# Patient Record
Sex: Male | Born: 1955
Health system: Southern US, Community
[De-identification: ages and names within clinical notes are randomized; demographics above are authoritative.]

## PROBLEM LIST (undated history)

## (undated) DIAGNOSIS — N138 Other obstructive and reflux uropathy: Secondary | ICD-10-CM

## (undated) DIAGNOSIS — N529 Male erectile dysfunction, unspecified: Secondary | ICD-10-CM

## (undated) DIAGNOSIS — N401 Enlarged prostate with lower urinary tract symptoms: Secondary | ICD-10-CM

## (undated) DIAGNOSIS — E291 Testicular hypofunction: Secondary | ICD-10-CM

## (undated) DIAGNOSIS — I499 Cardiac arrhythmia, unspecified: Secondary | ICD-10-CM

## (undated) DIAGNOSIS — G4733 Obstructive sleep apnea (adult) (pediatric): Secondary | ICD-10-CM

## (undated) DIAGNOSIS — I4891 Unspecified atrial fibrillation: Secondary | ICD-10-CM

## (undated) DIAGNOSIS — B1910 Unspecified viral hepatitis B without hepatic coma: Secondary | ICD-10-CM

## (undated) DIAGNOSIS — J069 Acute upper respiratory infection, unspecified: Secondary | ICD-10-CM

## (undated) HISTORY — DX: Other obstructive and reflux uropathy: N13.8

## (undated) HISTORY — DX: Male erectile dysfunction, unspecified: N52.9

## (undated) HISTORY — DX: Testicular hypofunction: E29.1

## (undated) HISTORY — DX: Obstructive sleep apnea (adult) (pediatric): G47.33

## (undated) HISTORY — DX: Other obstructive and reflux uropathy: N40.1

## (undated) HISTORY — DX: Unspecified viral hepatitis B without hepatic coma: B19.10

## (undated) HISTORY — DX: Acute upper respiratory infection, unspecified: J06.9

---

## 2000-04-09 HISTORY — PX: LUMBAR MICRODISCECTOMY: SHX99

## 2008-05-12 ENCOUNTER — Ambulatory Visit: Payer: Self-pay | Admitting: Family Medicine

## 2008-05-12 DIAGNOSIS — M719 Bursopathy, unspecified: Secondary | ICD-10-CM

## 2008-05-12 DIAGNOSIS — M67919 Unspecified disorder of synovium and tendon, unspecified shoulder: Secondary | ICD-10-CM | POA: Insufficient documentation

## 2008-05-12 DIAGNOSIS — Z8619 Personal history of other infectious and parasitic diseases: Secondary | ICD-10-CM | POA: Insufficient documentation

## 2008-05-14 ENCOUNTER — Ambulatory Visit: Payer: Self-pay | Admitting: Family Medicine

## 2008-05-14 LAB — CONVERTED CEMR LAB
Blood in Urine, dipstick: NEGATIVE
Glucose, Urine, Semiquant: NEGATIVE
Ketones, urine, test strip: NEGATIVE
Nitrite: NEGATIVE
Protein, U semiquant: NEGATIVE
Specific Gravity, Urine: 1.02
Urobilinogen, UA: 0.2
WBC Urine, dipstick: NEGATIVE
pH: 5.5

## 2008-05-19 LAB — CONVERTED CEMR LAB
ALT: 27 units/L (ref 0–53)
AST: 29 units/L (ref 0–37)
Albumin: 4.1 g/dL (ref 3.5–5.2)
Alkaline Phosphatase: 56 units/L (ref 39–117)
BUN: 15 mg/dL (ref 6–23)
Basophils Absolute: 0 10*3/uL (ref 0.0–0.1)
Basophils Relative: 0.6 % (ref 0.0–3.0)
Bilirubin, Direct: 0.1 mg/dL (ref 0.0–0.3)
CO2: 0 meq/L — CL (ref 19–32)
Calcium: 9.5 mg/dL (ref 8.4–10.5)
Chloride: 106 meq/L (ref 96–112)
Cholesterol: 196 mg/dL (ref 0–200)
Creatinine, Ser: 0.9 mg/dL (ref 0.4–1.5)
Eosinophils Absolute: 0.2 10*3/uL (ref 0.0–0.7)
Eosinophils Relative: 4.8 % (ref 0.0–5.0)
GFR calc Af Amer: 114 mL/min
GFR calc non Af Amer: 94 mL/min
Glucose, Bld: 92 mg/dL (ref 70–99)
HCT: 45 % (ref 39.0–52.0)
HDL: 72.7 mg/dL (ref >39.0)
Hemoglobin: 15.2 g/dL (ref 13.0–17.0)
LDL Cholesterol: 110 mg/dL — ABNORMAL HIGH (ref 0–99)
Lymphocytes Relative: 40.4 % (ref 12.0–46.0)
MCHC: 33.9 g/dL (ref 30.0–36.0)
MCV: 92.6 fL (ref 78.0–100.0)
Monocytes Absolute: 0.4 10*3/uL (ref 0.1–1.0)
Monocytes Relative: 8.8 % (ref 3.0–12.0)
Neutro Abs: 1.8 10*3/uL (ref 1.4–7.7)
Neutrophils Relative %: 45.4 % (ref 43.0–77.0)
PSA: 0.75 ng/mL (ref 0.10–4.00)
Platelets: 210 10*3/uL (ref 150–400)
Potassium: 4 meq/L (ref 3.5–5.1)
RBC: 4.86 M/uL (ref 4.22–5.81)
RDW: 13 % (ref 11.5–14.6)
Sodium: 145 meq/L (ref 135–145)
TSH: 2.36 microintl units/mL (ref 0.35–5.50)
Total Bilirubin: 0.6 mg/dL (ref 0.3–1.2)
Total CHOL/HDL Ratio: 2.7
Total Protein: 6.3 g/dL (ref 6.0–8.3)
Triglycerides: 66 mg/dL (ref 0–149)
VLDL: 13 mg/dL (ref 0–40)
WBC: 4 10*3/uL — ABNORMAL LOW (ref 4.5–10.5)

## 2008-05-31 ENCOUNTER — Telehealth: Payer: Self-pay | Admitting: Family Medicine

## 2008-06-07 ENCOUNTER — Ambulatory Visit: Payer: Self-pay | Admitting: Gastroenterology

## 2008-08-02 ENCOUNTER — Telehealth: Payer: Self-pay | Admitting: Family Medicine

## 2008-12-22 ENCOUNTER — Telehealth: Payer: Self-pay | Admitting: Family Medicine

## 2008-12-24 ENCOUNTER — Encounter: Payer: Self-pay | Admitting: Family Medicine

## 2009-01-07 ENCOUNTER — Telehealth: Payer: Self-pay | Admitting: Family Medicine

## 2009-04-12 ENCOUNTER — Telehealth: Payer: Self-pay | Admitting: Family Medicine

## 2009-07-20 ENCOUNTER — Telehealth: Payer: Self-pay | Admitting: Family Medicine

## 2009-09-26 ENCOUNTER — Telehealth: Payer: Self-pay | Admitting: Family Medicine

## 2009-09-30 ENCOUNTER — Ambulatory Visit: Payer: Self-pay | Admitting: Family Medicine

## 2009-09-30 DIAGNOSIS — N138 Other obstructive and reflux uropathy: Secondary | ICD-10-CM | POA: Insufficient documentation

## 2009-09-30 DIAGNOSIS — N401 Enlarged prostate with lower urinary tract symptoms: Secondary | ICD-10-CM | POA: Insufficient documentation

## 2009-10-03 LAB — CONVERTED CEMR LAB: PSA: 0.5 ng/mL (ref 0.10–4.00)

## 2009-11-17 ENCOUNTER — Ambulatory Visit: Payer: Self-pay | Admitting: Family Medicine

## 2009-11-17 DIAGNOSIS — S335XXA Sprain of ligaments of lumbar spine, initial encounter: Secondary | ICD-10-CM | POA: Insufficient documentation

## 2009-12-01 ENCOUNTER — Telehealth: Payer: Self-pay | Admitting: Family Medicine

## 2009-12-19 ENCOUNTER — Ambulatory Visit: Payer: Self-pay | Admitting: Family Medicine

## 2009-12-19 DIAGNOSIS — G4700 Insomnia, unspecified: Secondary | ICD-10-CM | POA: Insufficient documentation

## 2009-12-19 DIAGNOSIS — F3289 Other specified depressive episodes: Secondary | ICD-10-CM | POA: Insufficient documentation

## 2009-12-19 DIAGNOSIS — F329 Major depressive disorder, single episode, unspecified: Secondary | ICD-10-CM | POA: Insufficient documentation

## 2010-01-04 ENCOUNTER — Ambulatory Visit: Payer: Self-pay | Admitting: Family Medicine

## 2010-01-26 ENCOUNTER — Telehealth: Payer: Self-pay | Admitting: Family Medicine

## 2010-05-09 NOTE — Progress Notes (Signed)
Summary: refill etodolac  Phone Note Call from Patient Call back at [phone]   Caller: Patient-live call Reason for Call: Refill Medication Summary of Call: Rf Etodolac for a mail order. Send to [facility] order [facility]. Call pt if any ? Initial call taken by: Warnell Forester,  August 02, 2008 9:31 AM  Follow-up for Phone Call        Phone Call Completed, Rx Called In Follow-up by: Alfred Levins, CMA,  August 02, 2008 10:47 AM      Prescriptions: ETODOLAC 500 MG TABS (ETODOLAC) two times a day  #90 x 3   Entered by:   Alfred Levins, CMA   Authorized by:   Nelwyn Salisbury MD   Signed by:   Alfred Levins, CMA on 08/02/2008   Method used:   Faxed to ...       [address] [facility] (mail-order)       [address]       [place], [place]  [address]       Ph: [phone]       Fax: [phone]   RxID:   [id number]

## 2010-05-09 NOTE — Progress Notes (Signed)
Summary: REFILL REQUEST / INFO FOR DR Clent Ridges  Phone Note Call from Patient   Caller: Patient  [phone]   or  [phone] Summary of Call: Pt called to let Dr Clent Ridges know that he is starting to feel better.... he increased dosage of zoloft  to 150mg  - seems to be working.... wants to know what he should do?   Stay @ this dosage amt?  Pt will need a Rx for increased amt  as well as a refill Rx for  Finasteride 5mg  [facility].  Patient can be reached at home #  [phone]   or   cell # [phone]  with any questions or concerns.  Initial call taken by: Debbra Riding,  January 26, 2010 11:20 AM  Follow-up for Phone Call        okay, we will stay at 150 mg a day of Zoloft. Call in 50 mg tabs, to take 3 tabs once daily , #90 with 5 rf. Also we gave him a one year supply of Finasteride already this past June  Follow-up by: Nelwyn Salisbury MD,  January 26, 2010 12:09 PM  Additional Follow-up for Phone Call Additional follow up Details #1::        pt called  Additional Follow-up by: Pura Spice, RN,  January 27, 2010 8:19 AM    New/Updated Medications: ZOLOFT 50 MG TABS (SERTRALINE HCL) take 3 tabs once daily Prescriptions: ZOLOFT 50 MG TABS (SERTRALINE HCL) take 3 tabs once daily  #90 x 5   Entered by:   Pura Spice, RN   Authorized by:   Nelwyn Salisbury MD   Signed by:   Pura Spice, RN on 01/27/2010   Method used:   Electronically [facility]        [facility]       [address]       [place], [place]  [address]       Ph: [phone] or [phone]       Fax: [phone]   RxID:   [id number]

## 2010-05-09 NOTE — Letter (Signed)
Summary: [facility]  [facility]   Imported By: Maryln Gottron 12/30/2008 12:49:30  _____________________________________________________________________  External Attachment:    Type:   Image     Comment:   External Document

## 2010-05-09 NOTE — Miscellaneous (Signed)
Summary: [facility]  Clinical Lists Changes  Medications: Added new medication of MOVIPREP 100 GM  SOLR (PEG-KCL-NACL-NASULF-NA ASC-C) As per prep instructions. - Signed Rx of MOVIPREP 100 GM  SOLR (PEG-KCL-NACL-NASULF-NA ASC-C) As per prep instructions.;  #1 x 0;  Signed;  Entered by: Ezra Sites RN;  Authorized by: Mardella Layman MD [facility];  Method used: Electronically to [facility] [phone]*, [address], [place], [place]  [address], Ph: [phone] or [phone], Fax: [phone] Observations: Added new observation of ALLERGY REV: Done (06/07/2008 11:08)    Prescriptions: MOVIPREP 100 GM  SOLR (PEG-KCL-NACL-NASULF-NA ASC-C) As per prep instructions.  #1 x 0   Entered by:   Ezra Sites RN   Authorized by:   Mardella Layman MD [facility]   Signed by:   Ezra Sites RN on 06/07/2008   Method used:   Electronically to        [facility] [phone]* (retail)       [address]       [place], [place]  [address]       Ph: [phone] or [phone]       Fax: [phone]   RxID:   [id number]

## 2010-05-09 NOTE — Progress Notes (Signed)
Summary: avodart  Phone Note Call from Patient Call back at [phone]   Summary of Call: On new insurance & copay Avodart is $60 per month.  Cannot afford.  Have been without Avodart for awhile.   Insurance suggests sub finasteride or doxazosin or terazosin.  Which one is comparable & would you suggest?  [facility] to [facility] & written 90 day script to [facility] if it works.  [facility] to [facility] perhaps.   Initial call taken by: Rudy Jew, RN,  July 20, 2009 11:00 AM  Follow-up for Phone Call        okay, the closest thing would be Finasteride 5 mg once daily . Call in #30 with 2 rf to try it Follow-up by: Nelwyn Salisbury MD,  July 20, 2009 1:15 PM  Additional Follow-up for Phone Call Additional follow up Details #1::        called.    New/Updated Medications: FINASTERIDE 5 MG TABS (FINASTERIDE) 1 once daily Prescriptions: FINASTERIDE 5 MG TABS (FINASTERIDE) 1 once daily  #30 x 1   Entered by:   Raechel Ache, RN   Authorized by:   Nelwyn Salisbury MD   Signed by:   Raechel Ache, RN on 07/20/2009   Method used:   Electronically to        [facility] ([facility]ail)       [address]       [place], [place]  [address]       Ph: [phone] or [phone]       Fax: [phone]   RxID:   [id number]

## 2010-05-09 NOTE — Progress Notes (Signed)
Summary: which should he take flomax or avodart  Phone Note Call from Patient Call back at [phone]   Caller: pt live Call For: Michael Garner  Summary of Call: what would the difference be between flomax and avodart.  which one should he consider and do you have any samples of avodart.   Initial call taken by: Roselle Locus,  May 31, 2008 1:45 PM  Follow-up for Phone Call        Dr. Scotty Court gave him some samples of Flomax, but he had some hesitancy of urination in the early am.  Wonders whether Dr. Clent Ridges would recommend Avodart or Flomax?  Any samples?  If not, [facility] Follow-up by: Lynann Beaver CMA,  May 31, 2008 2:47 PM  Additional Follow-up for Phone Call Additional follow up Details #1::        what symptoms is he trying to treat? Additional Follow-up by: Nelwyn Salisbury MD,  May 31, 2008 5:16 PM    Additional Follow-up for Phone Call Additional follow up Details #2::    Per Dr. Clent Ridges .......Marland KitchenFlomax......Marland Kitchenneed sample/ order to call in and directions, please. Follow-up by: Lynann Beaver CMA,  June 01, 2008 8:11 AM  Additional Follow-up for Phone Call Additional follow up Details #3:: Details for Additional Follow-up Action Taken: no samples here. call in Flomax 0.4 mg once daily , #30 with 11 rf. He should see results in 2-3 weeks. Additional Follow-up by: Nelwyn Salisbury MD,  June 01, 2008 8:18 AM  New/Updated Medications: FLOMAX 0.4 MG CP24 (TAMSULOSIN HCL) Take 1 capsule by mouth nightly   Prescriptions: FLOMAX 0.4 MG CP24 (TAMSULOSIN HCL) Take 1 capsule by mouth nightly  #30 x 11   Entered by:   Alfred Levins, CMA   Authorized by:   Nelwyn Salisbury MD   Signed by:   Alfred Levins, CMA on 06/01/2008   Method used:   Electronically to        [facility] [phone]* (retail)       [address]       [place], [place]  [address]       Ph: [phone] or [phone]       Fax: [phone]   RxID:   [id number]

## 2010-05-09 NOTE — Progress Notes (Signed)
Summary: Excessive urination  Phone Note Call from Patient   Summary of Call: Patient states since starting Advodart he is getting up at least 4 times during the night to urinate. He wanted to take the Advodart because it shrinks your prostrate. Patient wants to know if Advodart and Flomax do the same thing. He states he did not have the urination problem when he was on Flomax. Should he go back on the Flomax? If you call him in something new or put him back on Flomax he would like [facility] Patient can be reached at [phone] or [phone]. Initial call taken by: Darra Lis RMA,  January 07, 2009 10:35 AM  Follow-up for Phone Call        stop Avodart and go back on Flomax 0.4 mg once daily . call in a one year supply Follow-up by: Nelwyn Salisbury MD,  January 07, 2009 1:04 PM    Prescriptions: FLOMAX 0.4 MG CP24 (TAMSULOSIN HCL) Take 1 capsule by mouth nightly  #90 x 3   Entered by:   Lynann Beaver CMA   Authorized by:   Nelwyn Salisbury MD   Signed by:   Lynann Beaver CMA on 01/07/2009   Method used:   Electronically to        [facility]ail)       [address]       [place], [place]  [address]       Ph: [phone] or [phone]       Fax: [phone]   RxID:   [id number]  Pt notified.

## 2010-05-09 NOTE — Assessment & Plan Note (Signed)
Summary: low back pain/dm   Vital Signs:  Patient profile:   55 year old male Weight:      219 pounds BMI:     29.81 Temp:     97.6 degrees F oral BP sitting:   130 / 74  (left arm) Cuff size:   regular  Vitals Entered By: Raechel Ache, RN (November 17, 2009 11:05 AM) CC: Hurt low back a few days ago at the gym.   History of Present Illness: Here for one week of stiffness and pain in the lower back. This started just after he had a weight lifting session and then used a stretching machine at the gym. He immediately felt a pull in the lower back, and it has been painful ever since. No radiation to the legs. Using heat and Etodolac.   Allergies: 1)  ! Penicillin  Past History:  Past Medical History: Reviewed history from 05/12/2008 and no changes required. Hepatitis B, hx of (15 years ago, appears to have completely resolved)  Past Surgical History: Reviewed history from 05/12/2008 and no changes required. Micro Discectomy  lumbar spine 2002  Review of Systems  The patient denies anorexia, fever, weight loss, weight gain, vision loss, decreased hearing, hoarseness, chest pain, syncope, dyspnea on exertion, peripheral edema, prolonged cough, headaches, hemoptysis, abdominal pain, melena, hematochezia, severe indigestion/heartburn, hematuria, incontinence, genital sores, muscle weakness, suspicious skin lesions, transient blindness, difficulty walking, depression, unusual weight change, abnormal bleeding, enlarged lymph nodes, angioedema, breast masses, and testicular masses.    Physical Exam  General:  in some pain Msk:  the lower abck is quite stiff with spasms, ROM is reduced, and he is tender . SLR are negative   Impression & Recommendations:  Problem # 1:  LUMBAR STRAIN (ICD-847.2)  Complete Medication List: 1)  Etodolac 500 Mg Tabs (Etodolac) .... Two times a day as needed pain 2)  Finasteride 5 Mg Tabs (Finasteride) .[name].. 1 once daily 3)  Flexeril 10 Mg Tabs  (Cyclobenzaprine hcl) .... Three times a day as needed spasm  Patient Instructions: 1)  Add Flexeril. Get a massage.  2)  Please schedule a follow-up appointment as needed .  Prescriptions: FLEXERIL 10 MG TABS (CYCLOBENZAPRINE HCL) three times a day as needed spasm  #60 x 5   Entered and Authorized by:   Nelwyn Salisbury MD   Signed by:   Nelwyn Salisbury MD on 11/17/2009   Method used:   Electronically to        [facility]       [address]       [place], [place]  [address]       Ph: [phone] or [phone]       Fax: [phone]   RxID:   [id number]

## 2010-05-09 NOTE — Assessment & Plan Note (Signed)
Summary: ROA/FUP/RCD   Vital Signs:  Patient profile:   55 year old male Weight:      210 pounds O2 Sat:      98 % Temp:     98.2 degrees F Pulse rate:   70 / minute BP sitting:   130 / 90  (left arm) Cuff size:   large  Vitals Entered By: Pura Spice, RN (January 04, 2010 10:53 AM) CC: follow-up visit with zoloft. some better with med. states got suspended from job yest.   History of Present Illness: Here to follow up on depression and insomnia. He feels better on Zoloft, and is tolerating it well. He thinks he needs a higher dose however. He is now sleeping well woith Temazepam. He is still meeting with his therapist.   Allergies: 1)  ! Penicillin  Past History:  Past Medical History: Hepatitis B, hx of (15 years ago, appears to have completely resolved) Depression insomnia  Social History: Married Alcohol use-yes Former smoker Drug use-no Regular exercise-no Occupation: Naval architect   Review of Systems  The patient denies anorexia, fever, weight loss, weight gain, vision loss, decreased hearing, hoarseness, chest pain, syncope, dyspnea on exertion, peripheral edema, prolonged cough, headaches, hemoptysis, abdominal pain, melena, hematochezia, severe indigestion/heartburn, hematuria, incontinence, genital sores, muscle weakness, suspicious skin lesions, transient blindness, difficulty walking, unusual weight change, abnormal bleeding, enlarged lymph nodes, angioedema, breast masses, and testicular masses.         Flu Vaccine Consent Questions     Do you have a history of severe allergic reactions to this vaccine? no    Any prior history of allergic reactions to egg and/or gelatin? no    Do you have a sensitivity to the preservative Thimersol? no    Do you have a past history of Guillan-Barre Syndrome? no    Do you currently have an acute febrile illness? no    Have you ever had a severe reaction to latex? no    Vaccine information given and explained to  patient? yes    Are you currently pregnant? no    Lot Number:[id number]   Exp Date:10/07/2010   Site Given  Left Deltoid IM Pura Spice, RN  January 04, 2010 11:49 AM   Physical Exam  General:  Well-developed,well-nourished,in no acute distress; alert,appropriate and cooperative throughout examination Psych:  Cognition and judgment appear intact. Alert and cooperative with normal attention span and concentration. No apparent delusions, illusions, hallucinations   Impression & Recommendations:  Problem # 1:  DEPRESSION (ICD-311)  The following medications were removed from the medication list:    Zoloft 50 Mg Tabs (Sertraline hcl) ..... Once daily His updated medication list for this problem includes:    Zoloft 100 Mg Tabs (Sertraline hcl) ..... Once daily  Problem # 2:  INSOMNIA (ICD-780.52)  His updated medication list for this problem includes:    Temazepam 15 Mg Caps (Temazepam) .[name]... Take 1 or 2 at bedtime  Complete Medication List: 1)  Etodolac 500 Mg Tabs (Etodolac) .... Two times a day as needed pain 2)  Finasteride 5 Mg Tabs (Finasteride) .[name].. 1 once daily 3)  Flexeril 10 Mg Tabs (Cyclobenzaprine hcl) .... Three times a day as needed spasm 4)  Temazepam 15 Mg Caps (Temazepam) .... Take 1 or 2 at bedtime 5)  Zoloft 100 Mg Tabs (Sertraline hcl) .... Once daily  Other Orders: Admin 1st Vaccine ([address]) Flu Vaccine 67yrs + ([address])  Patient Instructions: 1)  Increase Zoloft to 100 mg  a day. 2)  Please schedule a follow-up appointment in 2 weeks.  Prescriptions: ZOLOFT 100 MG TABS (SERTRALINE HCL) once daily  #30 x 2   Entered and Authorized by:   Nelwyn Salisbury MD   Signed by:   Nelwyn Salisbury MD on 01/04/2010   Method used:   Electronically to        [facility]       [address]       [place], [place]  [address]       Ph: [phone] or [phone]       Fax: [phone]   RxID:   [id number]

## 2010-05-09 NOTE — Progress Notes (Signed)
Summary: please return call  Phone Note Call from Patient Call back at [phone]   Caller: Patient-live call Reason for Call: Talk to Nurse Summary of Call: Has a ? for Dr Clent Ridges. please return his call. Pt refused to leave additional info. Initial call taken by: Warnell Forester,  April 12, 2009 2:37 PM  Follow-up for Phone Call        Pt is taking Flomax, and wants to change to Avadart again.......Marland Kitchenhe is having sexual problems with the Flomax.  Would like a 30 day supply called to [facility]).  Follow-up by: Lynann Beaver CMA,  April 12, 2009 3:02 PM  Additional Follow-up for Phone Call Additional follow up Details #1::        okay, call in Avodart 0.5 mg once daily , #30 with 11 rf Additional Follow-up by: Nelwyn Salisbury MD,  April 13, 2009 5:16 PM    Additional Follow-up for Phone Call Additional follow up Details #2::    rx called in, pt aware Follow-up by: Alfred Levins, CMA,  April 13, 2009 5:19 PM  New/Updated Medications: AVODART 0.5 MG CAPS (DUTASTERIDE) 1 by mouth daily Prescriptions: AVODART 0.5 MG CAPS (DUTASTERIDE) 1 by mouth daily  #30 x 11   Entered by:   Alfred Levins, CMA   Authorized by:   Nelwyn Salisbury MD   Signed by:   Alfred Levins, CMA on 04/13/2009   Method used:   Electronically to        [facility]       [address]       [place], [place]  [address]       Ph: [phone] or [phone]       Fax: [phone]   RxID:   [id number]

## 2010-05-09 NOTE — Assessment & Plan Note (Signed)
Summary: NEW TO BE EST/MHF   Vital Signs:  Patient Profile:   55 Years Old Male Height:     72 inches Weight:      225 pounds Temp:     98.2 degrees F oral Pulse rate:   82 / minute BP sitting:   122 / 84  (left arm) Cuff size:   large  Vitals Entered By: Alfred Levins, CMA (May 12, 2008 1:42 PM)                 Chief Complaint:  npx and not fasting.  History of Present Illness: 55 yr old male to establish with [place] and for a cpx. It has been about 5 years since he has had one. Feels great in general. He eats well and exercises regularly. 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 One area of concern is sharp pains in the anterior right shoulder over the past 6 months. No hx of trauma. He lifts weights regularlt, but when he takes a break from lifting the pain does not improve. No neck or back pain. Motrin helps a little.    Current Allergies (reviewed today): ! PENICILLIN  Past Medical History:    Reviewed history and no changes required:       Hepatitis B, hx of (15 years ago, appears to have completely resolved)  Past Surgical History:    Reviewed history and no changes required:       Micro Discectomy  lumbar spine 2002   Family History:    Reviewed history and no changes required:       Family History of Alcoholism/Addiction       Family History Lung cancer  Social History:    Reviewed history and no changes required:       Married       Alcohol use-yes       Former smoker       Drug use-no       Regular exercise-no       Occupation: Production designer, theatre/television/film at    Risk Factors:  Drug use:  no Alcohol use:  yes Exercise:  no   Review of Systems  The patient denies anorexia, fever, weight loss, weight gain, vision loss, decreased hearing, hoarseness, chest pain, syncope, dyspnea on exertion, peripheral edema, prolonged cough, headaches, hemoptysis, abdominal pain, melena, hematochezia, severe indigestion/heartburn, hematuria, incontinence, genital  sores, muscle weakness, suspicious skin lesions, transient blindness, difficulty walking, depression, unusual weight change, abnormal bleeding, enlarged lymph nodes, angioedema, breast masses, and testicular masses.     Physical Exam  General:     Well-developed,well-nourished,in no acute distress; alert,appropriate and cooperative throughout examination Head:     Normocephalic and atraumatic without obvious abnormalities. No apparent alopecia or balding. Eyes:     No corneal or conjunctival inflammation noted. EOMI. Perrla. Funduscopic exam benign, without hemorrhages, exudates or papilledema. Vision grossly normal. Ears:     External ear exam shows no significant lesions or deformities.  Otoscopic examination reveals clear canals, tympanic membranes are intact bilaterally without bulging, retraction, inflammation or discharge. Hearing is grossly normal bilaterally. Nose:     External nasal examination shows no deformity or inflammation. Nasal mucosa are pink and moist without lesions or exudates. Mouth:     Oral mucosa and oropharynx without lesions or exudates.  Teeth in good repair. Neck:     No deformities, masses, or tenderness noted. Chest Wall:     No deformities, masses, tenderness or gynecomastia  noted. Lungs:     Normal respiratory effort, chest expands symmetrically. Lungs are clear to auscultation, no crackles or wheezes. Heart:     Normal rate and regular rhythm. S1 and S2 normal without gallop, murmur, click, rub or other extra sounds. Abdomen:     Bowel sounds positive,abdomen soft and non-tender without masses, organomegaly or hernias noted. Rectal:     No external abnormalities noted. Normal sphincter tone. No rectal masses or tenderness. Heme neg. Genitalia:     Testes bilaterally descended without nodularity, tenderness or masses. No scrotal masses or lesions. No penis lesions or urethral discharge. Prostate:     no nodules, no asymmetry, and 1+ enlarged.   Msk:      No deformity or scoliosis noted of thoracic or lumbar spine.   Pulses:     R and L carotid,radial,femoral,dorsalis pedis and posterior tibial pulses are full and equal bilaterally Extremities:     No clubbing, cyanosis, edema, or deformity noted with normal full range of motion of all joints.  He is tender in the anterior subacromial area of the right shoulder. No crepitus.  Neurologic:     No cranial nerve deficits noted. Station and gait are normal. Plantar reflexes are down-going bilaterally. DTRs are symmetrical throughout. Sensory, motor and coordinative functions appear intact. Skin:     Intact without suspicious lesions or rashes Cervical Nodes:     No lymphadenopathy noted Axillary Nodes:     No palpable lymphadenopathy Inguinal Nodes:     No significant adenopathy Psych:     Cognition and judgment appear intact. Alert and cooperative with normal attention span and concentration. No apparent delusions, illusions, hallucinations    Impression & Recommendations:  Problem # 1:  WELL ADULT EXAM (ICD-V70.0)  Orders: Hemoccult Guaiac-1 spec.(in office) ([address]) EKG w/ Interpretation ([address]) Gastroenterology Referral (GI)   Problem # 2:  SUBACROMIAL BURSITIS (ICD-726.10)  Complete Medication List: 1)  Etodolac 500 Mg Tabs (Etodolac) .... Two times a day   Patient Instructions: 1)  Schedule a colonoscopy/sigmoidoscopy to help detect colon cancer. 2)  Set up fasting labs. 3)  Try Etodolac to reduce inflammation in the shoulder and avoid lifting weights for one month.  4)  Please schedule a follow-up appointment as needed.   Prescriptions: ETODOLAC 500 MG TABS (ETODOLAC) two times a day  #60 x 5   Entered and Authorized by:   Nelwyn Salisbury MD   Signed by:   Nelwyn Salisbury MD on 05/12/2008   Method used:   [facility]        [facility] [phone][facility] (retail)       [address]       [place], [place]  [address]       Ph: [phone] or [phone]       Fax:  [phone]   RxID:   [id number]

## 2010-05-09 NOTE — Progress Notes (Signed)
Summary: Avadart  LMTCB   Phone Note Call from Patient   Caller: Patient Call For: Nelwyn Salisbury MD Summary of Call: generic for Avadart........Marland Kitchenneeds 3 month refill [facility]  [facility]  Initial call taken by: Lynann Beaver CMA,  September 26, 2009 10:02 AM  Follow-up for Phone Call        needs OV first. it has been well over a year Follow-up by: Nelwyn Salisbury MD,  September 26, 2009 5:07 PM  Additional Follow-up for Phone Call Additional follow up Details #1::        LMTCB for appt. Additional Follow-up by: Lynann Beaver CMA,  September 27, 2009 8:25 AM     Appended Document: Avadart  LMTCB  Appt scheduled with Dr. Clent Ridges.

## 2010-05-09 NOTE — Progress Notes (Signed)
Summary: REQ FOR RX?  Phone Note Call from Patient   Caller: Patient   [phone] Summary of Call: Pt called to speak with Dr Clent Ridges...Marland KitchenMarland Kitchen Pt adv he wants a Rx for something to help him sleep... Pt states that he stays awake at night for hours with his mind racing and cannot go to sleep..... Pt was offered OV but he stated that he was just in the office earlier this month.... Pt would like Rx sent to [facility].  Pt can be reached at [phone] with any questions or concerns. Patient called again & left same vm info.  Rudy Jew, RN  December 02, 2009 4:11 PM   Initial call taken by: Debbra Riding,  December 01, 2009 3:28 PM  Follow-up for Phone Call        We have never discussed sleep problems. He needs an OV to talk about this  Follow-up by: Nelwyn Salisbury MD,  December 02, 2009 8:36 AM  Additional Follow-up for Phone Call Additional follow up Details #1::        Left message to inform.   Additional Follow-up by: Rudy Jew, RN,  December 02, 2009 4:10 PM

## 2010-05-09 NOTE — Assessment & Plan Note (Signed)
Summary: trouble sleeping/njr   Vital Signs:  Patient profile:   55 year old male Weight:      214 pounds O2 Sat:      98 % Temp:     98 degrees F Pulse rate:   66 / minute BP sitting:   124 / 82  (left arm) Cuff size:   large  Vitals Entered By: Pura Spice, RN (December 19, 2009 1:00 PM) CC: not sleeping goes to bed around 2 am drinks about 6 cups coffee   History of Present Illness: Here to ask for help with sleep. He developed trouble falling and staying asleep around 6 months ago, and he has also been dealing with some stress issues. He starting seeing a therapist several weeks ago, and she feels that he is depressed. She asked him to see me about this. He does admit to feeling depressed with sadness, hopelessness, and tearful spells. He has had some anhedonia, and he has been isolating himself socially. He is divorced, and he seems to be still dealing with some of these feelngs. He denies any suicidal thoughts.   Allergies: 1)  ! Penicillin  Past History:  Past Medical History: Reviewed history from 05/12/2008 and no changes required. Hepatitis B, hx of (15 years ago, appears to have completely resolved)  Review of Systems  The patient denies anorexia, fever, weight loss, weight gain, vision loss, decreased hearing, hoarseness, chest pain, syncope, dyspnea on exertion, peripheral edema, prolonged cough, headaches, hemoptysis, abdominal pain, melena, hematochezia, severe indigestion/heartburn, hematuria, incontinence, genital sores, muscle weakness, suspicious skin lesions, transient blindness, difficulty walking, unusual weight change, abnormal bleeding, enlarged lymph nodes, angioedema, breast masses, and testicular masses.    Physical Exam  General:  Well-developed,well-nourished,in no acute distress; alert,appropriate and cooperative throughout examination Neurologic:  alert & oriented X3, strength normal in all extremities, and gait normal.   Psych:  Oriented X3,  memory intact for recent and remote, normally interactive, good eye contact, depressed affect, and tearful.     Impression & Recommendations:  Problem # 1:  DEPRESSION, MILD (ICD-311)  His updated medication list for this problem includes:    Zoloft 50 Mg Tabs (Sertraline hcl) ..... Once daily  Problem # 2:  INSOMNIA (ICD-780.52)  His updated medication list for this problem includes:    Temazepam 15 Mg Caps (Temazepam) .[name]... Take 1 or 2 at bedtime  Complete Medication List: 1)  Etodolac 500 Mg Tabs (Etodolac) .... Two times a day as needed pain 2)  Finasteride 5 Mg Tabs (Finasteride) .[name].. 1 once daily 3)  Flexeril 10 Mg Tabs (Cyclobenzaprine hcl) .... Three times a day as needed spasm 4)  Temazepam 15 Mg Caps (Temazepam) .... Take 1 or 2 at bedtime 5)  Zoloft 50 Mg Tabs (Sertraline hcl) .... Once daily  Patient Instructions: 1)  His sleep difficulties clearly stem from his depression, and we will treat both. Start on meds as below, and I encouraged him to exercise and to spend time with friends. Continue therapy.  2)  Please schedule a follow-up appointment in 2 weeks.  Prescriptions: ZOLOFT 50 MG TABS (SERTRALINE HCL) once daily  #30 x 2   Entered and Authorized by:   Nelwyn Salisbury MD   Signed by:   Nelwyn Salisbury MD on 12/19/2009   Method used:   Print then Give to Patient   RxID:   [id number] TEMAZEPAM 15 MG CAPS (TEMAZEPAM) take 1 or 2 at bedtime  #60 x 2  Entered and Authorized by:   Nelwyn Salisbury MD   Signed by:   Nelwyn Salisbury MD on 12/19/2009   Method used:   Print then Give to Patient   RxID:   [id number]

## 2010-05-09 NOTE — Assessment & Plan Note (Signed)
Summary: refill Avodart/dm   Vital Signs:  Patient profile:   55 year old male Weight:      219 pounds BMI:     29.81 BP sitting:   120 / 84  (left arm) Cuff size:   regular  Vitals Entered By: Raechel Ache, RN (September 30, 2009 2:01 PM) CC: Out of meds. C/o L heel pain.   History of Present Illness: Here to discuss BPH. He had tried Flomax for several months last year but this did not help. Then he tried Avodart for awhile, and this worked very well. Then he tried Finasteride because it is cheaper, and this worked well also. Now he is out and needs refills. No other issues. His PSA in Feb. 2010 was 0.75. No erectile problems.   Allergies: 1)  ! Penicillin  Past History:  Past Medical History: Reviewed history from 05/12/2008 and no changes required. Hepatitis B, hx of (15 years ago, appears to have completely resolved)  Past Surgical History: Reviewed history from 05/12/2008 and no changes required. Micro Discectomy  lumbar spine 2002  Review of Systems  The patient denies anorexia, fever, weight loss, weight gain, vision loss, decreased hearing, hoarseness, chest pain, syncope, dyspnea on exertion, peripheral edema, prolonged cough, headaches, hemoptysis, abdominal pain, melena, hematochezia, severe indigestion/heartburn, hematuria, incontinence, genital sores, muscle weakness, suspicious skin lesions, transient blindness, difficulty walking, depression, unusual weight change, abnormal bleeding, enlarged lymph nodes, angioedema, breast masses, and testicular masses.    Physical Exam  General:  Well-developed,well-nourished,in no acute distress; alert,appropriate and cooperative throughout examination Genitalia:  Testes bilaterally descended without nodularity, tenderness or masses. No scrotal masses or lesions. No penis lesions or urethral discharge. Prostate:  no nodules, no asymmetry, and 2+ enlarged.     Impression & Recommendations:  Problem # 1:  BENIGN PROSTATIC  HYPERTROPHY, WITH OBSTRUCTION (ICD-600.01)  Orders: Venipuncture (52841) TLB-PSA (Prostate Specific Antigen) (84153-PSA)  Complete Medication List: 1)  Etodolac 500 Mg Tabs (Etodolac) .... Two times a day as needed pain 2)  Finasteride 5 Mg Tabs (Finasteride) .[name].. 1 once daily  Patient Instructions: 1)  Refilled Finasteride. Check a PSA Prescriptions: ETODOLAC 500 MG TABS (ETODOLAC) two times a day as needed pain  #60 x 5   Entered and Authorized by:   Nelwyn Salisbury MD   Signed by:   Nelwyn Salisbury MD on 09/30/2009   Method used:   [facility] to        [id number]       [address]       [place], [place]  [address]       Ph: [phone] or [phone]       Fax: [phone]   RxID:   [id number] FINASTERIDE 5 MG TABS (FINASTERIDE) 1 once daily  #90 x 3   Entered and Authorized by:   Nelwyn Salisbury MD   Signed by:   Nelwyn Salisbury MD on 09/30/2009   Method used:   Print then Give to Patient   RxID:   [id number] FINASTERIDE 5 MG TABS (FINASTERIDE) 1 once daily  #30 x 0   Entered and Authorized by:   Nelwyn Salisbury MD   Signed by:   Nelwyn Salisbury MD on 09/30/2009   Method used:   Electronically to        [id number]       [address]       [place], [place]  [address]  Ph: [phone] or [phone]       Fax: [phone]   RxID:   [id number]

## 2010-11-22 ENCOUNTER — Other Ambulatory Visit: Payer: Self-pay | Admitting: Family Medicine

## 2011-06-08 ENCOUNTER — Other Ambulatory Visit (INDEPENDENT_AMBULATORY_CARE_PROVIDER_SITE_OTHER): Payer: 59

## 2011-06-08 DIAGNOSIS — Z0000 Encounter for general adult medical examination without abnormal findings: Secondary | ICD-10-CM

## 2011-06-08 LAB — LIPID PANEL
Cholesterol: 201 mg/dL — ABNORMAL HIGH (ref 0–200)
HDL: 63 mg/dL (ref >39.00)
Total CHOL/HDL Ratio: 3
Triglycerides: 39 mg/dL (ref 0.0–149.0)
VLDL: 7.8 mg/dL (ref 0.0–40.0)

## 2011-06-08 LAB — TSH: TSH: 1.92 u[IU]/mL (ref 0.35–5.50)

## 2011-06-08 LAB — BASIC METABOLIC PANEL
BUN: 20 mg/dL (ref 6–23)
CO2: 29 mEq/L (ref 19–32)
Calcium: 9.6 mg/dL (ref 8.4–10.5)
Chloride: 106 mEq/L (ref 96–112)
Creatinine, Ser: 0.9 mg/dL (ref 0.4–1.5)
GFR: 92.85 mL/min (ref >60.00)
Glucose, Bld: 81 mg/dL (ref 70–99)
Potassium: 4.9 mEq/L (ref 3.5–5.1)
Sodium: 141 mEq/L (ref 135–145)

## 2011-06-08 LAB — HEPATIC FUNCTION PANEL
ALT: 30 U/L (ref 0–53)
AST: 28 U/L (ref 0–37)
Albumin: 4.3 g/dL (ref 3.5–5.2)
Alkaline Phosphatase: 61 U/L (ref 39–117)
Bilirubin, Direct: 0.1 mg/dL (ref 0.0–0.3)
Total Bilirubin: 0.6 mg/dL (ref 0.3–1.2)
Total Protein: 6.4 g/dL (ref 6.0–8.3)

## 2011-06-08 LAB — CBC WITH DIFFERENTIAL/PLATELET
Basophils Absolute: 0.1 10*3/uL (ref 0.0–0.1)
Basophils Relative: 1.2 % (ref 0.0–3.0)
Eosinophils Absolute: 0.2 10*3/uL (ref 0.0–0.7)
Eosinophils Relative: 3.2 % (ref 0.0–5.0)
HCT: 46.4 % (ref 39.0–52.0)
Hemoglobin: 15.5 g/dL (ref 13.0–17.0)
Lymphocytes Relative: 38.8 % (ref 12.0–46.0)
Lymphs Abs: 2 10*3/uL (ref 0.7–4.0)
MCHC: 33.4 g/dL (ref 30.0–36.0)
MCV: 90.9 fl (ref 78.0–100.0)
Monocytes Absolute: 0.4 10*3/uL (ref 0.1–1.0)
Monocytes Relative: 7.4 % (ref 3.0–12.0)
Neutro Abs: 2.6 10*3/uL (ref 1.4–7.7)
Neutrophils Relative %: 49.4 % (ref 43.0–77.0)
Platelets: 213 10*3/uL (ref 150.0–400.0)
RBC: 5.11 Mil/uL (ref 4.22–5.81)
RDW: 13.7 % (ref 11.5–14.6)
WBC: 5.2 10*3/uL (ref 4.5–10.5)

## 2011-06-08 LAB — POCT URINALYSIS DIPSTICK
Bilirubin, UA: NEGATIVE
Blood, UA: NEGATIVE
Glucose, UA: NEGATIVE
Ketones, UA: NEGATIVE
Leukocytes, UA: NEGATIVE
Nitrite, UA: NEGATIVE
Protein, UA: NEGATIVE
Spec Grav, UA: 1.02
Urobilinogen, UA: 0.2
pH, UA: 5.5

## 2011-06-08 LAB — PSA: PSA: 0.67 ng/mL (ref 0.10–4.00)

## 2011-06-08 LAB — LDL CHOLESTEROL, DIRECT: Direct LDL: 122 mg/dL

## 2011-06-12 NOTE — Progress Notes (Signed)
Quick Note:  Pt aware ______ 

## 2011-06-18 ENCOUNTER — Encounter: Payer: Self-pay | Admitting: Family Medicine

## 2011-06-18 ENCOUNTER — Ambulatory Visit (INDEPENDENT_AMBULATORY_CARE_PROVIDER_SITE_OTHER): Payer: 59 | Admitting: Family Medicine

## 2011-06-18 VITALS — BP 124/80 | HR 72 | Temp 98.2°F | Resp 16 | Ht 72.0 in | Wt 220.0 lb

## 2011-06-18 DIAGNOSIS — Z0000 Encounter for general adult medical examination without abnormal findings: Secondary | ICD-10-CM

## 2011-06-18 DIAGNOSIS — N5089 Other specified disorders of the male genital organs: Secondary | ICD-10-CM

## 2011-06-18 DIAGNOSIS — N508 Other specified disorders of male genital organs: Secondary | ICD-10-CM

## 2011-06-18 MED ORDER — TADALAFIL 20 MG PO TABS: 20.0000 mg | ORAL_TABLET | Freq: Every day | ORAL | Status: DC | PRN

## 2011-06-18 MED ORDER — DUTASTERIDE 0.5 MG PO CAPS: 0.5000 mg | ORAL_CAPSULE | Freq: Every day | ORAL | Status: DC

## 2011-06-18 NOTE — Progress Notes (Signed)
  Subjective:    Patient ID: Michael Garner, male    DOB: [birth year], 56 y.o.   MRN: [id number]  HPI 56 yr old male for a cpx. He has a few questions. First he wants to try Cialis for erection difficulties. He wants to go back on Avodart, since the Finasteride he has been taking the past year does not work as well as before. He has nocturia times 3-4, and he has some urgency during the day. He has never had a colonoscopy.   Review of Systems  Constitutional: Negative.   HENT: Negative.   Eyes: Negative.   Respiratory: Negative.   Cardiovascular: Negative.   Gastrointestinal: Negative.   Genitourinary: Negative.   Musculoskeletal: Negative.   Skin: Negative.   Neurological: Negative.   Hematological: Negative.   Psychiatric/Behavioral: Negative.        Objective:   Physical Exam  Constitutional: He is oriented to person, place, and time. He appears well-developed and well-nourished. No distress.  HENT:  Head: Normocephalic and atraumatic.  Right Ear: External ear normal.  Left Ear: External ear normal.  Nose: Nose normal.  Mouth/Throat: Oropharynx is clear and moist. No oropharyngeal exudate.  Eyes: Conjunctivae and EOM are normal. Pupils are equal, round, and reactive to light. Right eye exhibits no discharge. Left eye exhibits no discharge. No scleral icterus.  Neck: Neck supple. No JVD present. No tracheal deviation present. No thyromegaly present.  Cardiovascular: Normal rate, regular rhythm, normal heart sounds and intact distal pulses.  Exam reveals no gallop and no friction rub.   No murmur heard.      EKG normal  Pulmonary/Chest: Effort normal and breath sounds normal. No respiratory distress. He has no wheezes. He has no rales. He exhibits no tenderness.  Abdominal: Soft. Bowel sounds are normal. He exhibits no distension and no mass. There is no tenderness. There is no rebound and no guarding.  Genitourinary: Rectum normal, prostate normal and penis normal. Guaiac negative  stool. No penile tenderness.       The right testicle is normal. The left testicle has a non-tender, round, smooth, cystic lesion off the superior pole  Musculoskeletal: Normal range of motion. He exhibits no edema and no tenderness.  Lymphadenopathy:    He has no cervical adenopathy.  Neurological: He is alert and oriented to person, place, and time. He has normal reflexes. No cranial nerve deficit. He exhibits normal muscle tone. Coordination normal.  Skin: Skin is warm and dry. No rash noted. He is not diaphoretic. No erythema. No pallor.  Psychiatric: He has a normal mood and affect. His behavior is normal. Judgment and thought content normal.          Assessment & Plan:  Well exam. Try Cialis. Switch from Finasteride to Avodart. Set up a colonoscopy. Get an US of the scrotal lesion, which feels like an epididymal cyst.

## 2011-06-19 ENCOUNTER — Encounter: Payer: Self-pay | Admitting: Gastroenterology

## 2011-06-21 ENCOUNTER — Ambulatory Visit
Admission: RE | Admit: 2011-06-21 | Discharge: 2011-06-21 | Disposition: A | Discharge status: Home / Self Care | Payer: 59 | Source: Ambulatory Visit | Attending: Family Medicine | Admitting: Family Medicine

## 2011-06-21 DIAGNOSIS — N5089 Other specified disorders of the male genital organs: Secondary | ICD-10-CM

## 2011-06-22 NOTE — Progress Notes (Signed)
Quick Note:  Spoke with pt ______ 

## 2011-06-25 ENCOUNTER — Telehealth: Payer: Self-pay | Admitting: Family Medicine

## 2011-06-25 NOTE — Telephone Encounter (Signed)
No the Avodart is completely different than Doxazocin. I would advise him to try the Avodart as we discussed

## 2011-06-25 NOTE — Telephone Encounter (Signed)
Pt called and said that he had been prescribed dutasteride (AVODART) 0.5 MG capsule but his insurance is showing a cheaper alternative, called  Doxazosin mesylate. Pt is wondering what Dr Clent Ridges thinks about this med and if pcp would recommend it?

## 2011-06-26 NOTE — Telephone Encounter (Signed)
Left voice message.

## 2011-07-02 ENCOUNTER — Ambulatory Visit (AMBULATORY_SURGERY_CENTER): Payer: 59 | Admitting: *Deleted

## 2011-07-02 ENCOUNTER — Encounter: Payer: Self-pay | Admitting: Gastroenterology

## 2011-07-02 VITALS — Ht 72.0 in | Wt 220.0 lb

## 2011-07-02 DIAGNOSIS — Z1211 Encounter for screening for malignant neoplasm of colon: Secondary | ICD-10-CM

## 2011-07-02 MED ORDER — PEG-KCL-NACL-NASULF-NA ASC-C 100 G PO SOLR: ORAL | Status: DC

## 2011-07-02 NOTE — Progress Notes (Signed)
Pt. Stated he had a colon in [place] in 1998 that showed diverticulosis.

## 2011-07-05 ENCOUNTER — Telehealth: Payer: Self-pay | Admitting: Family Medicine

## 2011-07-05 MED ORDER — DUTASTERIDE 0.5 MG PO CAPS: 0.5000 mg | ORAL_CAPSULE | Freq: Every day | ORAL | Status: DC

## 2011-07-05 NOTE — Telephone Encounter (Signed)
Patient called stating that his 3 mth supply of avodart is too expensive and is requesting a [facility] [address] Please assist.

## 2011-07-05 NOTE — Telephone Encounter (Signed)
Script sent e-scribe and spoke with pt. 

## 2011-07-05 NOTE — Telephone Encounter (Signed)
Addended by: Aniceto Boss A on: 07/05/2011 02:02 PM   Modules accepted: Orders

## 2011-07-05 NOTE — Telephone Encounter (Signed)
Okay to switch. Give him a one year supply

## 2011-07-18 ENCOUNTER — Ambulatory Visit (AMBULATORY_SURGERY_CENTER): Payer: 59 | Admitting: Gastroenterology

## 2011-07-18 ENCOUNTER — Encounter: Payer: Self-pay | Admitting: Gastroenterology

## 2011-07-18 VITALS — BP 115/66 | HR 58 | Temp 96.7°F | Resp 24 | Ht 72.0 in | Wt 220.0 lb

## 2011-07-18 DIAGNOSIS — Z1211 Encounter for screening for malignant neoplasm of colon: Secondary | ICD-10-CM

## 2011-07-18 HISTORY — PX: COLONOSCOPY: SHX174

## 2011-07-18 MED ORDER — SODIUM CHLORIDE 0.9 % IV SOLN: 500.0000 mL | INTRAVENOUS | Status: DC

## 2011-07-18 NOTE — Progress Notes (Signed)
Patient did not experience any of the following events: a burn prior to discharge; a fall within the facility; wrong site/side/patient/procedure/implant event; or a hospital transfer or hospital admission upon discharge from the facility. (G8907) Patient did not have preoperative order for IV antibiotic SSI prophylaxis. (G8918)  

## 2011-07-18 NOTE — Op Note (Signed)
[facility] [address] [place], [place]  [address]  COLONOSCOPY PROCEDURE REPORT  PATIENT:  Michael Garner, Michael Garner  MR#:  [id number] BIRTHDATE:  [birth year], 55 yrs. old  GENDER:  male ENDOSCOPIST:  Vania Rea. Jarold Motto, MD, [facility] REF. BY:  Tera Mater. Clent Ridges, M.D. PROCEDURE DATE:  07/18/2011 PROCEDURE:  Average-risk screening colonoscopy G0121 ASA CLASS:  Class I INDICATIONS:  Routine Risk Screening MEDICATIONS:   propofol (Diprivan) 200 mg IV  DESCRIPTION OF PROCEDURE:   After the risks and benefits and of the procedure were explained, informed consent was obtained. Digital rectal exam was performed and revealed no abnormalities. The LB 180AL [id number] endoscope was introduced through the anus and advanced to the cecum, which was identified by both the appendix and ileocecal valve.  The quality of the prep was excellent, using MoviPrep.  The instrument was then slowly withdrawn as the colon was fully examined. <<PROCEDUREIMAGES>>  FINDINGS:  There were mild diverticular changes in left colon. diverticulosis was found.  No polyps or cancers were seen.  This was otherwise a normal examination of the colon.   Retroflexed views in the rectum revealed no abnormalities.    The scope was then withdrawn from the patient and the procedure completed.  COMPLICATIONS:  None ENDOSCOPIC IMPRESSION: 1) Diverticulosis,mild,left sided diverticulosis 2) No polyps or cancers 3) Otherwise normal examination RECOMMENDATIONS: 1) High fiber diet. 2) Continue current colorectal screening recommendations for "routine risk" patients with a repeat colonoscopy in 10 years.  REPEAT EXAM:  No  ______________________________ Vania Rea. Jarold Motto, MD, [facility]  CC:  n. eSIGNED:   Vania Rea. Glori Machnik at 07/18/2011 10:05 AM  [name], [phone]

## 2011-07-18 NOTE — Patient Instructions (Signed)
YOU HAD AN ENDOSCOPIC PROCEDURE TODAY AT THE [facility]: Refer to the procedure report that was given to you for any specific questions about what was found during the examination.  If the procedure report does not answer your questions, please call your gastroenterologist to clarify.  If you requested that your care partner not be given the details of your procedure findings, then the procedure report has been included in a sealed envelope for you to review at your convenience later.  YOU SHOULD EXPECT: Some feelings of bloating in the abdomen. Passage of more gas than usual.  Walking can help get rid of the air that was put into your GI tract during the procedure and reduce the bloating. If you had a lower endoscopy (such as a colonoscopy or flexible sigmoidoscopy) you may notice spotting of blood in your stool or on the toilet paper. If you underwent a bowel prep for your procedure, then you may not have a normal bowel movement for a few days.  DIET: Your first meal following the procedure should be a light meal and then it is ok to progress to your normal diet.  A half-sandwich or bowl of soup is an example of a good first meal.  Heavy or fried foods are harder to digest and may make you feel nauseous or bloated.  Likewise meals heavy in dairy and vegetables can cause extra gas to form and this can also increase the bloating.  Drink plenty of fluids but you should avoid alcoholic beverages for 24 hours.  ACTIVITY: Your care partner should take you home directly after the procedure.  You should plan to take it easy, moving slowly for the rest of the day.  You can resume normal activity the day after the procedure however you should NOT DRIVE or use heavy machinery for 24 hours (because of the sedation medicines used during the test).    SYMPTOMS TO REPORT IMMEDIATELY: A gastroenterologist can be reached at any hour.  During normal business hours, 8:30 AM to 5:00 PM Monday through Friday,  call [phone].  After hours and on weekends, please call the GI answering service at [phone] who will take a message and have the physician on call contact you.   Following lower endoscopy (colonoscopy or flexible sigmoidoscopy):  Excessive amounts of blood in the stool  Significant tenderness or worsening of abdominal pains  Swelling of the abdomen that is new, acute  Fever of 100F or higher   FOLLOW UP: If any biopsies were taken you will be contacted by phone or by letter within the next 1-3 weeks.  Call your gastroenterologist if you have not heard about the biopsies in 3 weeks.  Our staff will call the home number listed on your records the next business day following your procedure to check on you and address any questions or concerns that you may have at that time regarding the information given to you following your procedure. This is a courtesy call and so if there is no answer at the home number and we have not heard from you through the emergency physician on call, we will assume that you have returned to your regular daily activities without incident.  SIGNATURES/CONFIDENTIALITY: You and/or your care partner have signed paperwork which will be entered into your electronic medical record.  These signatures attest to the fact that that the information above on your After Visit Summary has been reviewed and is understood.  Full responsibility of the confidentiality of   this discharge information lies with you and/or your care-partner.    INFORMATION ON DIVERTICULOSIS & HIGH FIBER DIET GIVEN TO YOU TODAY 

## 2011-07-18 NOTE — Progress Notes (Signed)
The pt tolerated the colonoscopy very well. Maw   

## 2011-07-19 ENCOUNTER — Telehealth: Payer: Self-pay

## 2011-07-19 NOTE — Telephone Encounter (Signed)
  Follow up Call-  Call back number 07/18/2011  Post procedure Call Back phone  # [phone]  Permission to leave phone message Yes     Patient questions:  Do you have a fever, pain , or abdominal swelling? no Pain Score  0 *  Have you tolerated food without any problems? yes  Have you been able to return to your normal activities? yes  Do you have any questions about your discharge instructions: Diet   no Medications  no Follow up visit  no  Do you have questions or concerns about your Care? no  Actions: * If pain score is 4 or above: No action needed, pain <4.  Per the pt "no problems at all". Maw

## 2011-12-18 ENCOUNTER — Telehealth: Payer: Self-pay | Admitting: Family Medicine

## 2011-12-18 NOTE — Telephone Encounter (Signed)
Patient called stating that he saw a commercial on TV that stated that he can get a free trial offer of cialis from his MD and would like to know if he can receive this. Please advise/assist.

## 2011-12-18 NOTE — Telephone Encounter (Signed)
Have him make an OV to discuss this  

## 2011-12-24 ENCOUNTER — Ambulatory Visit (INDEPENDENT_AMBULATORY_CARE_PROVIDER_SITE_OTHER): Payer: 59 | Admitting: Family Medicine

## 2011-12-24 ENCOUNTER — Encounter: Payer: Self-pay | Admitting: Family Medicine

## 2011-12-24 VITALS — BP 120/78 | HR 72 | Temp 98.8°F | Wt 219.0 lb

## 2011-12-24 DIAGNOSIS — Z23 Encounter for immunization: Secondary | ICD-10-CM

## 2011-12-24 DIAGNOSIS — N529 Male erectile dysfunction, unspecified: Secondary | ICD-10-CM

## 2011-12-24 MED ORDER — TADALAFIL 20 MG PO TABS: 20.0000 mg | ORAL_TABLET | ORAL | Status: DC | PRN

## 2011-12-24 NOTE — Progress Notes (Signed)
  Subjective:    Patient ID: Michael Garner, male    DOB: 03/17/1956, 56 y.o.   MRN: [id number]  HPI Here to discuss ED treatments. He has used Cialis in the past but found it to be too expensive. Now he wants to try this again. He asks about the various options including getting it locally versus a mail away plan. He asks about taking it daily versus prn.    Review of Systems  Constitutional: Negative.        Objective:   Physical Exam  Constitutional: He appears well-developed and well-nourished.          Assessment & Plan:  We discussed all the options and he wants to take 20 mg prn and to get it at a local pharmacy.

## 2011-12-24 NOTE — Addendum Note (Signed)
Addended by: Aniceto Boss A on: 12/24/2011 11:41 AM   Modules accepted: Orders

## 2013-05-08 ENCOUNTER — Other Ambulatory Visit (INDEPENDENT_AMBULATORY_CARE_PROVIDER_SITE_OTHER): Payer: 59

## 2013-05-08 DIAGNOSIS — Z0000 Encounter for general adult medical examination without abnormal findings: Secondary | ICD-10-CM

## 2013-05-08 LAB — HEPATIC FUNCTION PANEL
ALT: 36 U/L (ref 0–53)
AST: 33 U/L (ref 0–37)
Albumin: 4.2 g/dL (ref 3.5–5.2)
Alkaline Phosphatase: 55 U/L (ref 39–117)
Bilirubin, Direct: 0.1 mg/dL (ref 0.0–0.3)
Total Bilirubin: 0.8 mg/dL (ref 0.3–1.2)
Total Protein: 6.3 g/dL (ref 6.0–8.3)

## 2013-05-08 LAB — POCT URINALYSIS DIPSTICK
Bilirubin, UA: NEGATIVE
Blood, UA: NEGATIVE
Glucose, UA: NEGATIVE
Ketones, UA: NEGATIVE
Leukocytes, UA: NEGATIVE
Nitrite, UA: NEGATIVE
Protein, UA: NEGATIVE
Spec Grav, UA: 1.02
Urobilinogen, UA: 0.2
pH, UA: 7

## 2013-05-08 LAB — CBC WITH DIFFERENTIAL/PLATELET
Basophils Absolute: 0 10*3/uL (ref 0.0–0.1)
Basophils Relative: 0.8 % (ref 0.0–3.0)
Eosinophils Absolute: 0.2 10*3/uL (ref 0.0–0.7)
Eosinophils Relative: 4.5 % (ref 0.0–5.0)
HCT: 47 % (ref 39.0–52.0)
Hemoglobin: 15.5 g/dL (ref 13.0–17.0)
Lymphocytes Relative: 40.8 % (ref 12.0–46.0)
Lymphs Abs: 2.1 10*3/uL (ref 0.7–4.0)
MCHC: 33 g/dL (ref 30.0–36.0)
MCV: 91.4 fl (ref 78.0–100.0)
Monocytes Absolute: 0.4 10*3/uL (ref 0.1–1.0)
Monocytes Relative: 7.2 % (ref 3.0–12.0)
Neutro Abs: 2.4 10*3/uL (ref 1.4–7.7)
Neutrophils Relative %: 46.7 % (ref 43.0–77.0)
Platelets: 207 10*3/uL (ref 150.0–400.0)
RBC: 5.15 Mil/uL (ref 4.22–5.81)
RDW: 14.1 % (ref 11.5–14.6)
WBC: 5.1 10*3/uL (ref 4.5–10.5)

## 2013-05-08 LAB — LIPID PANEL
Cholesterol: 201 mg/dL — ABNORMAL HIGH (ref 0–200)
HDL: 57.3 mg/dL (ref >39.00)
Total CHOL/HDL Ratio: 4
Triglycerides: 46 mg/dL (ref 0.0–149.0)
VLDL: 9.2 mg/dL (ref 0.0–40.0)

## 2013-05-08 LAB — BASIC METABOLIC PANEL
BUN: 17 mg/dL (ref 6–23)
CO2: 28 mEq/L (ref 19–32)
Calcium: 9.5 mg/dL (ref 8.4–10.5)
Chloride: 107 mEq/L (ref 96–112)
Creatinine, Ser: 0.9 mg/dL (ref 0.4–1.5)
GFR: 88.79 mL/min (ref >60.00)
Glucose, Bld: 81 mg/dL (ref 70–99)
Potassium: 5 mEq/L (ref 3.5–5.1)
Sodium: 141 mEq/L (ref 135–145)

## 2013-05-08 LAB — LDL CHOLESTEROL, DIRECT: Direct LDL: 128 mg/dL

## 2013-05-08 LAB — PSA: PSA: 0.72 ng/mL (ref 0.10–4.00)

## 2013-05-08 LAB — TSH: TSH: 2.43 u[IU]/mL (ref 0.35–5.50)

## 2013-05-19 ENCOUNTER — Ambulatory Visit (INDEPENDENT_AMBULATORY_CARE_PROVIDER_SITE_OTHER): Payer: 59 | Admitting: Family Medicine

## 2013-05-19 ENCOUNTER — Encounter: Payer: Self-pay | Admitting: Family Medicine

## 2013-05-19 VITALS — BP 130/80 | HR 65 | Temp 98.1°F | Ht 72.0 in | Wt 230.0 lb

## 2013-05-19 DIAGNOSIS — R5383 Other fatigue: Secondary | ICD-10-CM

## 2013-05-19 DIAGNOSIS — Z23 Encounter for immunization: Secondary | ICD-10-CM

## 2013-05-19 DIAGNOSIS — R5381 Other malaise: Secondary | ICD-10-CM

## 2013-05-19 DIAGNOSIS — Z0000 Encounter for general adult medical examination without abnormal findings: Secondary | ICD-10-CM

## 2013-05-19 LAB — TESTOSTERONE: Testosterone: 470.76 ng/dL (ref 350.00–890.00)

## 2013-05-19 NOTE — Progress Notes (Signed)
Pre visit review using our clinic review tool, if applicable. No additional management support is needed unless otherwise documented below in the visit note. 

## 2013-05-19 NOTE — Progress Notes (Signed)
   Subjective:    Patient ID: Michael Garner, male    DOB: [birth year], 58 y.o.   MRN: [id number]  HPI 59 yr old male for a cpx. He asks about some generalized fatigue and a loss of libido. Otherwise he is doing well.    Review of Systems  Constitutional: Positive for fatigue. Negative for fever, chills, diaphoresis, activity change, appetite change and unexpected weight change.  HENT: Negative.   Eyes: Negative.   Respiratory: Negative.   Cardiovascular: Negative.   Gastrointestinal: Negative.   Genitourinary: Negative.   Musculoskeletal: Negative.   Skin: Negative.   Neurological: Negative.   Psychiatric/Behavioral: Negative.        Objective:   Physical Exam  Constitutional: He is oriented to person, place, and time. He appears well-developed and well-nourished. No distress.  HENT:  Head: Normocephalic and atraumatic.  Right Ear: External ear normal.  Left Ear: External ear normal.  Nose: Nose normal.  Mouth/Throat: Oropharynx is clear and moist. No oropharyngeal exudate.  Eyes: Conjunctivae and EOM are normal. Pupils are equal, round, and reactive to light. Right eye exhibits no discharge. Left eye exhibits no discharge. No scleral icterus.  Neck: Neck supple. No JVD present. No tracheal deviation present. No thyromegaly present.  Cardiovascular: Normal rate, regular rhythm, normal heart sounds and intact distal pulses.  Exam reveals no gallop and no friction rub.   No murmur heard. EKG normal   Pulmonary/Chest: Effort normal and breath sounds normal. No respiratory distress. He has no wheezes. He has no rales. He exhibits no tenderness.  Abdominal: Soft. Bowel sounds are normal. He exhibits no distension and no mass. There is no tenderness. There is no rebound and no guarding.  Genitourinary: Rectum normal, prostate normal and penis normal. Guaiac negative stool. No penile tenderness.  Musculoskeletal: Normal range of motion. He exhibits no edema and no tenderness.    Lymphadenopathy:    He has no cervical adenopathy.  Neurological: He is alert and oriented to person, place, and time. He has normal reflexes. No cranial nerve deficit. He exhibits normal muscle tone. Coordination normal.  Skin: Skin is warm and dry. No rash noted. He is not diaphoretic. No erythema. No pallor.  Psychiatric: He has a normal mood and affect. His behavior is normal. Judgment and thought content normal.          Assessment & Plan:  Well exam. Check levels for vitamin D and testosterone.

## 2013-05-20 LAB — VITAMIN D 25 HYDROXY (VIT D DEFICIENCY, FRACTURES): Vit D, 25-Hydroxy: 52 ng/mL (ref 30–89)

## 2015-02-15 ENCOUNTER — Other Ambulatory Visit (INDEPENDENT_AMBULATORY_CARE_PROVIDER_SITE_OTHER): Payer: 59

## 2015-02-15 DIAGNOSIS — Z0000 Encounter for general adult medical examination without abnormal findings: Secondary | ICD-10-CM | POA: Diagnosis not present

## 2015-02-15 LAB — CBC WITH DIFFERENTIAL/PLATELET
Basophils Absolute: 0 10*3/uL (ref 0.0–0.1)
Basophils Relative: 0.7 % (ref 0.0–3.0)
Eosinophils Absolute: 0.3 10*3/uL (ref 0.0–0.7)
Eosinophils Relative: 5.1 % — ABNORMAL HIGH (ref 0.0–5.0)
HCT: 45.1 % (ref 39.0–52.0)
Hemoglobin: 15.2 g/dL (ref 13.0–17.0)
Lymphocytes Relative: 34.6 % (ref 12.0–46.0)
Lymphs Abs: 1.8 10*3/uL (ref 0.7–4.0)
MCHC: 33.7 g/dL (ref 30.0–36.0)
MCV: 89.7 fl (ref 78.0–100.0)
Monocytes Absolute: 0.5 10*3/uL (ref 0.1–1.0)
Monocytes Relative: 8.7 % (ref 3.0–12.0)
Neutro Abs: 2.7 10*3/uL (ref 1.4–7.7)
Neutrophils Relative %: 50.9 % (ref 43.0–77.0)
Platelets: 216 10*3/uL (ref 150.0–400.0)
RBC: 5.03 Mil/uL (ref 4.22–5.81)
RDW: 14.5 % (ref 11.5–15.5)
WBC: 5.2 10*3/uL (ref 4.0–10.5)

## 2015-02-15 LAB — POCT URINALYSIS DIPSTICK
Bilirubin, UA: NEGATIVE
Blood, UA: NEGATIVE
Glucose, UA: NEGATIVE
Ketones, UA: NEGATIVE
Leukocytes, UA: NEGATIVE
Nitrite, UA: NEGATIVE
Protein, UA: NEGATIVE
Spec Grav, UA: 1.02
Urobilinogen, UA: 0.2
pH, UA: 7

## 2015-02-15 LAB — BASIC METABOLIC PANEL
BUN: 19 mg/dL (ref 6–23)
CO2: 31 mEq/L (ref 19–32)
Calcium: 9.9 mg/dL (ref 8.4–10.5)
Chloride: 105 mEq/L (ref 96–112)
Creatinine, Ser: 0.89 mg/dL (ref 0.40–1.50)
GFR: 92.84 mL/min (ref >60.00)
Glucose, Bld: 88 mg/dL (ref 70–99)
Potassium: 5.4 mEq/L — ABNORMAL HIGH (ref 3.5–5.1)
Sodium: 143 mEq/L (ref 135–145)

## 2015-02-15 LAB — HEPATIC FUNCTION PANEL
ALT: 18 U/L (ref 0–53)
AST: 22 U/L (ref 0–37)
Albumin: 4.4 g/dL (ref 3.5–5.2)
Alkaline Phosphatase: 64 U/L (ref 39–117)
Bilirubin, Direct: 0.1 mg/dL (ref 0.0–0.3)
Total Bilirubin: 0.5 mg/dL (ref 0.2–1.2)
Total Protein: 6.6 g/dL (ref 6.0–8.3)

## 2015-02-15 LAB — LIPID PANEL
Cholesterol: 204 mg/dL — ABNORMAL HIGH (ref 0–200)
HDL: 75.5 mg/dL (ref >39.00)
LDL Cholesterol: 106 mg/dL — ABNORMAL HIGH (ref 0–99)
NonHDL: 128.92
Total CHOL/HDL Ratio: 3
Triglycerides: 113 mg/dL (ref 0.0–149.0)
VLDL: 22.6 mg/dL (ref 0.0–40.0)

## 2015-02-15 LAB — PSA: PSA: 1.16 ng/mL (ref 0.10–4.00)

## 2015-02-15 LAB — TSH: TSH: 2.06 u[IU]/mL (ref 0.35–4.50)

## 2015-02-22 ENCOUNTER — Encounter: Payer: Self-pay | Admitting: Family Medicine

## 2015-02-22 ENCOUNTER — Ambulatory Visit (INDEPENDENT_AMBULATORY_CARE_PROVIDER_SITE_OTHER): Payer: 59 | Admitting: Family Medicine

## 2015-02-22 VITALS — BP 127/71 | HR 87 | Temp 98.1°F | Ht 72.0 in | Wt 236.0 lb

## 2015-02-22 DIAGNOSIS — Z0000 Encounter for general adult medical examination without abnormal findings: Secondary | ICD-10-CM | POA: Diagnosis not present

## 2015-02-22 DIAGNOSIS — Z23 Encounter for immunization: Secondary | ICD-10-CM | POA: Diagnosis not present

## 2015-02-22 MED ORDER — TADALAFIL 20 MG PO TABS: 20.0000 mg | ORAL_TABLET | ORAL | Status: DC | PRN

## 2015-02-22 NOTE — Progress Notes (Signed)
   Subjective:    Patient ID: Michael Garner, male    DOB: June 24, 1955, 59 y.o.   MRN: [id number]  HPI 59 yr old male for a cpx. He feels fine but does mention some mild intermittent pain in the right knee. No locking or giving way. He thinks he injured it while running in the summer of 2015, and he as never had anyone look at it.    Review of Systems  Constitutional: Negative.   HENT: Negative.   Eyes: Negative.   Respiratory: Negative.   Cardiovascular: Negative.   Gastrointestinal: Negative.   Genitourinary: Negative.   Musculoskeletal: Negative.   Skin: Negative.   Neurological: Negative.   Psychiatric/Behavioral: Negative.        Objective:   Physical Exam  Constitutional: He is oriented to person, place, and time. He appears well-developed and well-nourished. No distress.  HENT:  Head: Normocephalic and atraumatic.  Right Ear: External ear normal.  Left Ear: External ear normal.  Nose: Nose normal.  Mouth/Throat: Oropharynx is clear and moist. No oropharyngeal exudate.  Eyes: Conjunctivae and EOM are normal. Pupils are equal, round, and reactive to light. Right eye exhibits no discharge. Left eye exhibits no discharge. No scleral icterus.  Neck: Neck supple. No JVD present. No tracheal deviation present. No thyromegaly present.  Cardiovascular: Normal rate, regular rhythm, normal heart sounds and intact distal pulses.  Exam reveals no gallop and no friction rub.   No murmur heard. EKG normal   Pulmonary/Chest: Effort normal and breath sounds normal. No respiratory distress. He has no wheezes. He has no rales. He exhibits no tenderness.  Abdominal: Soft. Bowel sounds are normal. He exhibits no distension and no mass. There is no tenderness. There is no rebound and no guarding.  Genitourinary: Rectum normal, prostate normal and penis normal. Guaiac negative stool. No penile tenderness.  Musculoskeletal: Normal range of motion. He exhibits no edema or tenderness.    Lymphadenopathy:    He has no cervical adenopathy.  Neurological: He is alert and oriented to person, place, and time. He has normal reflexes. No cranial nerve deficit. He exhibits normal muscle tone. Coordination normal.  Skin: Skin is warm and dry. No rash noted. He is not diaphoretic. No erythema. No pallor.  Psychiatric: He has a normal mood and affect. His behavior is normal. Judgment and thought content normal.          Assessment & Plan:  Well exam. He does not want to pursue any further workup on the knee at this time, but he will let us know if he changes his mind. We discussed diet and exercise advice.

## 2015-02-22 NOTE — Progress Notes (Signed)
Pre visit review using our clinic review tool, if applicable. No additional management support is needed unless otherwise documented below in the visit note. 

## 2015-02-23 ENCOUNTER — Encounter: Payer: Self-pay | Admitting: Family Medicine

## 2016-01-20 ENCOUNTER — Telehealth: Payer: Self-pay | Admitting: Family Medicine

## 2016-01-20 NOTE — Telephone Encounter (Signed)
Please advise 

## 2016-01-20 NOTE — Telephone Encounter (Signed)
Pt would like to have a referral to go to see a accupuctist for the R leg nerve damage.  Pt need so that he can claim it on his insurance.

## 2016-01-23 NOTE — Telephone Encounter (Signed)
He will need an OV to discuss this

## 2016-01-23 NOTE — Telephone Encounter (Signed)
Pt scheduled  

## 2016-01-23 NOTE — Telephone Encounter (Signed)
lmom for pt to call office to make appointment.

## 2016-01-24 ENCOUNTER — Ambulatory Visit (INDEPENDENT_AMBULATORY_CARE_PROVIDER_SITE_OTHER): Payer: 59 | Admitting: Family Medicine

## 2016-01-24 ENCOUNTER — Encounter: Payer: Self-pay | Admitting: Family Medicine

## 2016-01-24 VITALS — BP 122/61 | HR 68 | Temp 97.9°F | Ht 72.0 in | Wt 228.0 lb

## 2016-01-24 DIAGNOSIS — R29898 Other symptoms and signs involving the musculoskeletal system: Secondary | ICD-10-CM | POA: Diagnosis not present

## 2016-01-24 DIAGNOSIS — Z23 Encounter for immunization: Secondary | ICD-10-CM | POA: Diagnosis not present

## 2016-01-24 NOTE — Progress Notes (Signed)
   Subjective:    Patient ID: Michael Garner, male    DOB: 24-Feb-1956, 60 y.o.   MRN: [id number]  HPI Here asking for a referral for acupuncture. He has had trouble with his right leg since 2002 when he started to have lumbar spine issues. At first he had lower back pain and was diagnosed with 2 herniated lumbar discs. He had 2 separate microdiscectomy surgeries to shave off the left side of the L4-5 disc in 2002 and then the right side of the disc in 2003. These procedures successfully resolved the pain, but he has had nerve damage since then. He has chronic numbness on the lateral right foot and he has weakness in the right leg. He works out regularly and tries to strengthen the leg, but he still has trouble stepping off the right foot, such as when standing on his toes or when going up steps. He wants ti try acupuncture for this.    Review of Systems  Respiratory: Negative.   Cardiovascular: Negative.   Musculoskeletal: Negative for back pain.  Neurological: Positive for weakness and numbness.       Objective:   Physical Exam  Constitutional: He appears well-developed and well-nourished.  Cardiovascular: Normal rate, regular rhythm, normal heart sounds and intact distal pulses.   Pulmonary/Chest: Effort normal and breath sounds normal.  Musculoskeletal:  Lower back exam is normal, no tenderness and full ROM. He does show some weakness on right foot plantar flexion          Assessment & Plan:  Right leg weakness and numbness as a result of lumbar disc disease. We will refer to Acupuncture for this after he gives [place] the name of someone in his insurance network. Laurey Morale, MD

## 2016-01-24 NOTE — Progress Notes (Signed)
Pre visit review using our clinic review tool, if applicable. No additional management support is needed unless otherwise documented below in the visit note. 

## 2016-01-27 ENCOUNTER — Encounter: Payer: Self-pay | Admitting: Family Medicine

## 2016-01-27 NOTE — Telephone Encounter (Signed)
Noted  

## 2016-04-09 HISTORY — PX: LUMBAR LAMINECTOMY/DECOMPRESSION MICRODISCECTOMY: SHX5026

## 2016-05-21 DIAGNOSIS — M21371 Foot drop, right foot: Secondary | ICD-10-CM | POA: Diagnosis not present

## 2016-05-21 DIAGNOSIS — M79672 Pain in left foot: Secondary | ICD-10-CM | POA: Diagnosis not present

## 2016-05-21 DIAGNOSIS — M7672 Peroneal tendinitis, left leg: Secondary | ICD-10-CM | POA: Diagnosis not present

## 2016-06-07 DIAGNOSIS — M21371 Foot drop, right foot: Secondary | ICD-10-CM | POA: Diagnosis not present

## 2016-06-07 DIAGNOSIS — M79672 Pain in left foot: Secondary | ICD-10-CM | POA: Diagnosis not present

## 2016-06-07 DIAGNOSIS — M7672 Peroneal tendinitis, left leg: Secondary | ICD-10-CM | POA: Diagnosis not present

## 2016-11-15 DIAGNOSIS — H16223 Keratoconjunctivitis sicca, not specified as Sjogren's, bilateral: Secondary | ICD-10-CM | POA: Diagnosis not present

## 2016-12-27 ENCOUNTER — Encounter: Payer: Self-pay | Admitting: Family Medicine

## 2017-06-28 DIAGNOSIS — R52 Pain, unspecified: Secondary | ICD-10-CM | POA: Diagnosis not present

## 2017-09-16 ENCOUNTER — Encounter: Payer: Self-pay | Admitting: Family Medicine

## 2017-09-19 ENCOUNTER — Encounter: Payer: Self-pay | Admitting: Family Medicine

## 2017-09-19 ENCOUNTER — Ambulatory Visit: Payer: 59 | Admitting: Family Medicine

## 2017-09-19 VITALS — BP 124/82 | HR 70 | Temp 98.0°F | Ht 72.0 in | Wt 238.0 lb

## 2017-09-19 DIAGNOSIS — L03311 Cellulitis of abdominal wall: Secondary | ICD-10-CM

## 2017-09-19 DIAGNOSIS — R29898 Other symptoms and signs involving the musculoskeletal system: Secondary | ICD-10-CM

## 2017-09-19 MED ORDER — DOXYCYCLINE HYCLATE 100 MG PO CAPS: 100.0000 mg | ORAL_CAPSULE | Freq: Two times a day (BID) | ORAL | 0 refills | Status: AC

## 2017-09-19 NOTE — Progress Notes (Signed)
   Subjective:    Patient ID: Michael Garner, male    DOB: [birth year], 62 y.o.   MRN: [id number]  HPI Here to check the site of a tick bite on the LLQ. He found the tick 4 days ago and removed it with tweezers. He has felt fine since then but the site has turned a little red.    Review of Systems  Constitutional: Negative.   Respiratory: Negative.   Cardiovascular: Negative.   Gastrointestinal: Negative.   Skin: Positive for color change.  Neurological: Negative.        Objective:   Physical Exam  Constitutional: He is oriented to person, place, and time. He appears well-developed and well-nourished.  Cardiovascular: Normal rate, regular rhythm, normal heart sounds and intact distal pulses.  Pulmonary/Chest: Effort normal and breath sounds normal.  Neurological: He is alert and oriented to person, place, and time.  Skin:  There is a red papular lesion on the LLQ surrounded by a zone of erythema. No warmth or tenderness          Assessment & Plan:  Local tick bite reaction, treat with Doxycycline. Alysia Penna, MD

## 2017-09-24 NOTE — Telephone Encounter (Signed)
The referral was done to Dr. Leta Baptist

## 2017-09-25 ENCOUNTER — Ambulatory Visit (INDEPENDENT_AMBULATORY_CARE_PROVIDER_SITE_OTHER): Payer: 59 | Admitting: Family Medicine

## 2017-09-25 ENCOUNTER — Encounter: Payer: Self-pay | Admitting: Family Medicine

## 2017-09-25 VITALS — BP 118/80 | HR 66 | Temp 97.6°F | Ht 71.0 in | Wt 237.4 lb

## 2017-09-25 DIAGNOSIS — Z23 Encounter for immunization: Secondary | ICD-10-CM | POA: Diagnosis not present

## 2017-09-25 DIAGNOSIS — R768 Other specified abnormal immunological findings in serum: Secondary | ICD-10-CM | POA: Diagnosis not present

## 2017-09-25 DIAGNOSIS — Z0000 Encounter for general adult medical examination without abnormal findings: Secondary | ICD-10-CM | POA: Diagnosis not present

## 2017-09-25 DIAGNOSIS — Z125 Encounter for screening for malignant neoplasm of prostate: Secondary | ICD-10-CM

## 2017-09-25 LAB — LIPID PANEL
Cholesterol: 236 mg/dL — ABNORMAL HIGH (ref 0–200)
HDL: 88.3 mg/dL (ref >39.00)
LDL Cholesterol: 131 mg/dL — ABNORMAL HIGH (ref 0–99)
NonHDL: 147.39
Total CHOL/HDL Ratio: 3
Triglycerides: 81 mg/dL (ref 0.0–149.0)
VLDL: 16.2 mg/dL (ref 0.0–40.0)

## 2017-09-25 LAB — CBC WITH DIFFERENTIAL/PLATELET
Basophils Absolute: 0.1 10*3/uL (ref 0.0–0.1)
Basophils Relative: 1.9 % (ref 0.0–3.0)
Eosinophils Absolute: 0.2 10*3/uL (ref 0.0–0.7)
Eosinophils Relative: 4.6 % (ref 0.0–5.0)
HCT: 46.9 % (ref 39.0–52.0)
Hemoglobin: 16.1 g/dL (ref 13.0–17.0)
Lymphocytes Relative: 38 % (ref 12.0–46.0)
Lymphs Abs: 1.8 10*3/uL (ref 0.7–4.0)
MCHC: 34.2 g/dL (ref 30.0–36.0)
MCV: 90.2 fl (ref 78.0–100.0)
Monocytes Absolute: 0.4 10*3/uL (ref 0.1–1.0)
Monocytes Relative: 8.4 % (ref 3.0–12.0)
Neutro Abs: 2.3 10*3/uL (ref 1.4–7.7)
Neutrophils Relative %: 47.1 % (ref 43.0–77.0)
Platelets: 222 10*3/uL (ref 150.0–400.0)
RBC: 5.2 Mil/uL (ref 4.22–5.81)
RDW: 14.9 % (ref 11.5–15.5)
WBC: 4.9 10*3/uL (ref 4.0–10.5)

## 2017-09-25 LAB — HEPATIC FUNCTION PANEL
ALT: 20 U/L (ref 0–53)
AST: 21 U/L (ref 0–37)
Albumin: 4.8 g/dL (ref 3.5–5.2)
Alkaline Phosphatase: 69 U/L (ref 39–117)
Bilirubin, Direct: 0.2 mg/dL (ref 0.0–0.3)
Total Bilirubin: 0.9 mg/dL (ref 0.2–1.2)
Total Protein: 7 g/dL (ref 6.0–8.3)

## 2017-09-25 LAB — BASIC METABOLIC PANEL
BUN: 19 mg/dL (ref 6–23)
CO2: 29 mEq/L (ref 19–32)
Calcium: 9.7 mg/dL (ref 8.4–10.5)
Chloride: 102 mEq/L (ref 96–112)
Creatinine, Ser: 0.79 mg/dL (ref 0.40–1.50)
GFR: 105.6 mL/min (ref >60.00)
Glucose, Bld: 87 mg/dL (ref 70–99)
Potassium: 4.6 mEq/L (ref 3.5–5.1)
Sodium: 139 mEq/L (ref 135–145)

## 2017-09-25 LAB — PSA: PSA: 1.06 ng/mL (ref 0.10–4.00)

## 2017-09-25 LAB — POC URINALSYSI DIPSTICK (AUTOMATED)
Bilirubin, UA: NEGATIVE
Blood, UA: NEGATIVE
Glucose, UA: NEGATIVE
Ketones, UA: NEGATIVE
Leukocytes, UA: NEGATIVE
Nitrite, UA: NEGATIVE
Protein, UA: POSITIVE — AB
Spec Grav, UA: 1.015 (ref 1.010–1.025)
Urobilinogen, UA: 0.2 E.U./dL
pH, UA: 7 (ref 5.0–8.0)

## 2017-09-25 LAB — TSH: TSH: 1.94 u[IU]/mL (ref 0.35–4.50)

## 2017-09-25 NOTE — Progress Notes (Signed)
   Subjective:    Patient ID: Michael Garner, male    DOB: [birth year], 62 y.o.   MRN: [id number]  HPI Here for a well exam. He feels great. The cellulitis on his abdomen has cleared up completely.    Review of Systems  Constitutional: Negative.   HENT: Negative.   Eyes: Negative.   Respiratory: Negative.   Cardiovascular: Negative.   Gastrointestinal: Negative.   Genitourinary: Negative.   Musculoskeletal: Negative.   Skin: Negative.   Neurological: Negative.   Psychiatric/Behavioral: Negative.        Objective:   Physical Exam  Constitutional: He is oriented to person, place, and time. He appears well-developed and well-nourished. No distress.  HENT:  Head: Normocephalic and atraumatic.  Right Ear: External ear normal.  Left Ear: External ear normal.  Nose: Nose normal.  Mouth/Throat: Oropharynx is clear and moist. No oropharyngeal exudate.  Eyes: Pupils are equal, round, and reactive to light. Conjunctivae and EOM are normal. Right eye exhibits no discharge. Left eye exhibits no discharge. No scleral icterus.  Neck: Neck supple. No JVD present. No tracheal deviation present. No thyromegaly present.  Cardiovascular: Normal rate, regular rhythm, normal heart sounds and intact distal pulses. Exam reveals no gallop and no friction rub.  No murmur heard. Pulmonary/Chest: Effort normal and breath sounds normal. No respiratory distress. He has no wheezes. He has no rales. He exhibits no tenderness.  Abdominal: Soft. Bowel sounds are normal. He exhibits no distension and no mass. There is no tenderness. There is no rebound and no guarding.  Genitourinary: Rectum normal, prostate normal and penis normal. Rectal exam shows guaiac negative stool. No penile tenderness.  Musculoskeletal: Normal range of motion. He exhibits no edema or tenderness.  Lymphadenopathy:    He has no cervical adenopathy.  Neurological: He is alert and oriented to person, place, and time. He has normal reflexes.  He displays normal reflexes. No cranial nerve deficit. He exhibits normal muscle tone. Coordination normal.  Skin: Skin is warm and dry. No rash noted. He is not diaphoretic. No erythema. No pallor.  Psychiatric: He has a normal mood and affect. His behavior is normal. Judgment and thought content normal.          Assessment & Plan:  Well exam. We discussed diet and exercise. Get fasting labs soon.  Alysia Penna, MD

## 2017-09-27 ENCOUNTER — Encounter: Payer: Self-pay | Admitting: Family Medicine

## 2017-09-27 NOTE — Addendum Note (Signed)
Addended by: Alysia Penna A on: 09/27/2017 02:02 PM   Modules accepted: Orders

## 2017-09-27 NOTE — Telephone Encounter (Signed)
The GI clinic will determine if he has actual virus present in his body or not

## 2017-09-29 LAB — HEPATITIS C ANTIBODY
Hepatitis C Ab: REACTIVE — AB
SIGNAL TO CUT-OFF: 24.2 — ABNORMAL HIGH (ref <1.00)

## 2017-09-29 LAB — HCV RNA,QUANTITATIVE REAL TIME PCR
HCV Quantitative Log: <1.18 Log IU/mL
HCV RNA, PCR, QN: <15 IU/mL

## 2017-09-30 ENCOUNTER — Encounter: Payer: Self-pay | Admitting: Family Medicine

## 2017-09-30 DIAGNOSIS — M25552 Pain in left hip: Principal | ICD-10-CM

## 2017-09-30 DIAGNOSIS — M25551 Pain in right hip: Secondary | ICD-10-CM

## 2017-09-30 NOTE — Telephone Encounter (Signed)
The referral was done  

## 2017-10-03 ENCOUNTER — Encounter: Payer: Self-pay | Admitting: Family Medicine

## 2017-10-14 ENCOUNTER — Ambulatory Visit (INDEPENDENT_AMBULATORY_CARE_PROVIDER_SITE_OTHER): Payer: 59 | Admitting: Orthopedic Surgery

## 2017-10-23 ENCOUNTER — Encounter (INDEPENDENT_AMBULATORY_CARE_PROVIDER_SITE_OTHER): Payer: Self-pay | Admitting: Orthopedic Surgery

## 2017-10-23 ENCOUNTER — Ambulatory Visit (INDEPENDENT_AMBULATORY_CARE_PROVIDER_SITE_OTHER): Payer: Self-pay

## 2017-10-23 ENCOUNTER — Ambulatory Visit (INDEPENDENT_AMBULATORY_CARE_PROVIDER_SITE_OTHER): Payer: 59

## 2017-10-23 ENCOUNTER — Ambulatory Visit (INDEPENDENT_AMBULATORY_CARE_PROVIDER_SITE_OTHER): Payer: 59 | Admitting: Orthopedic Surgery

## 2017-10-23 DIAGNOSIS — M79604 Pain in right leg: Secondary | ICD-10-CM | POA: Diagnosis not present

## 2017-10-23 DIAGNOSIS — M545 Low back pain: Secondary | ICD-10-CM

## 2017-10-23 DIAGNOSIS — M79605 Pain in left leg: Secondary | ICD-10-CM

## 2017-10-25 ENCOUNTER — Telehealth (INDEPENDENT_AMBULATORY_CARE_PROVIDER_SITE_OTHER): Payer: Self-pay

## 2017-10-25 ENCOUNTER — Encounter (INDEPENDENT_AMBULATORY_CARE_PROVIDER_SITE_OTHER): Payer: Self-pay | Admitting: Orthopedic Surgery

## 2017-10-25 DIAGNOSIS — Z719 Counseling, unspecified: Secondary | ICD-10-CM | POA: Diagnosis not present

## 2017-10-25 NOTE — Telephone Encounter (Signed)
Error

## 2017-10-27 ENCOUNTER — Encounter (INDEPENDENT_AMBULATORY_CARE_PROVIDER_SITE_OTHER): Payer: Self-pay | Admitting: Orthopedic Surgery

## 2017-10-27 NOTE — Progress Notes (Signed)
Office Visit Note   Patient: Michael Garner           Date of Birth: April 05, 1956           MRN: [id number] Visit Date: 10/23/2017 Requested by: Laurey Morale, MD [place], [place] [address] PCP: Laurey Morale, MD  Subjective: Chief Complaint  Patient presents with  . Hip Pain    HPI: Michael Garner is a patient with bilateral hip buttock pain.  He has had pain for several months.  He denies however any groin pain.  Does describe radicular leg pain.  He states that his right leg is a little bit weak.  He had lumbar spine surgery x220 years ago.  He is seeing the neurosurgeon on August 6.  He describes new numbness in the left foot.  He also reports hamstring pain.  He manages a [employer].  He has been limping.  He is using ibuprofen and CBD oil.  He describes having surgery at L4-5 for left-sided herniated disc and then 6 weeks later had to have recurrent surgery for right-sided disc at the same level.              ROS: All systems reviewed are negative as they relate to the chief complaint within the history of present illness.  Patient denies  fevers or chills.   Assessment & Plan: Visit Diagnoses:  1. Bilateral leg pain     Plan: Impression is not much in the way of hip arthritis but significant lumbar spine facet arthritis noted on plain radiographs.  Plan MRI lumbar spine to evaluate left-sided radiculopathy due to failure of conservative management of what appears to be likely foraminal nerve compression.  I think if he has this MRI scan prior to his appointment with the neurosurgeon treatment could be expedited.  Follow-Up Instructions: Return for after MRI.   Orders:  Orders Placed This Encounter  Procedures  . XR HIPS BILAT W OR W/O PELVIS MIN 5 VIEWS  . XR Lumbar Spine 2-3 Views  . MR Lumbar Spine w/o contrast   No orders of the defined types were placed in this encounter.     Procedures: No procedures performed   Clinical Data: No  additional findings.  Objective: Vital Signs: There were no vitals taken for this visit.  Physical Exam:   Constitutional: Patient appears well-developed HEENT:  Head: Normocephalic Eyes:EOM are normal Neck: Normal range of motion Cardiovascular: Normal rate Pulmonary/chest: Effort normal Neurologic: Patient is alert Skin: Skin is warm Psychiatric: Patient has normal mood and affect    Ortho Exam: Ortho exam demonstrates no groin pain with internal/external rotation of either hip.  Has good ankle dorsiflexion plantarflexion quad hamstring strength with symmetric reflexes 0 to 1+ out of 4 bilateral patella and Achilles.  No definite paresthesias L1 S1 bilaterally although there is some foot paresthesias dorsally on the left compared to the right.  Pedal pulses palpable.  No other masses lymphadenopathy or skin changes noted in that back region.  Patient does have pain with forward and lateral bending.  Specialty Comments:  No specialty comments available.  Imaging: No results found.   PMFS History: Patient Active Problem List   Diagnosis Date Noted  . Depressive disorder, not elsewhere classified 12/19/2009  . INSOMNIA 12/19/2009  . LUMBAR STRAIN 11/17/2009  . BENIGN PROSTATIC HYPERTROPHY, WITH OBSTRUCTION 09/30/2009  . SUBACROMIAL BURSITIS 05/12/2008  . HEPATITIS B, HX OF 05/12/2008   Past Medical History:  Diagnosis Date  .  BPH with urinary obstruction   . Erectile dysfunction   . Hepatitis B infection    resolved     Family History  Problem Relation Age of Onset  . Alcohol abuse Father   . Lung cancer Father     Past Surgical History:  Procedure Laterality Date  . COLONOSCOPY  07-18-11    per Dr. Sharlett Iles, diverticulosis only, repeat in 10 yrs   . LUMBAR MICRODISCECTOMY  2002   Social History   Occupational History  . Not on file  Tobacco Use  . Smoking status: Never Smoker  . Smokeless tobacco: Never Used  Substance and Sexual Activity  . Alcohol  use: Yes    Alcohol/week: 0.0 oz    Comment: occ  . Drug use: No  . Sexual activity: Not on file

## 2017-11-04 ENCOUNTER — Ambulatory Visit (INDEPENDENT_AMBULATORY_CARE_PROVIDER_SITE_OTHER): Payer: 59 | Admitting: Orthopedic Surgery

## 2017-11-05 ENCOUNTER — Encounter: Payer: Self-pay | Admitting: Family Medicine

## 2017-11-06 NOTE — Telephone Encounter (Signed)
We can certainly order the MRI for him, but please set up an OV for me to review Dr. Randel Pigg notes and get caught up. The referral to Neurosurgery would depend on the results of the MRI

## 2017-11-08 NOTE — Telephone Encounter (Signed)
Spoke with pt and advised him that we would need to see him in order to step in and order an MRI as we do not have any clinical documentation to support need. He understands. He asked if he needed to keep appt with [facility] for next week if he ultimately wants to see Dr. Sherwood Gambler for neurosurgery. Advised pt that was his choice whether to keep appt. He states he may cancel it. He asked about appt with Sherwood Gambler and if he would be in network with his insurance, I advised pt he would need to call their office for this information. He states he will call them and schedule appt, also states he will see them before having MRI done. Nothing further needed at this time.

## 2017-11-08 NOTE — Telephone Encounter (Signed)
Pt called b/c he has an appt on the 6th w/ the neuro physician; pt is calling to get help w/ setting up the MRI and to recommended to see another physician; contact to advise

## 2017-11-12 ENCOUNTER — Ambulatory Visit: Payer: 59 | Admitting: Diagnostic Neuroimaging

## 2017-11-13 ENCOUNTER — Encounter: Payer: Self-pay | Admitting: Family Medicine

## 2017-11-15 NOTE — Telephone Encounter (Signed)
I agree that he does not need an OV with me. Dr. Marlou Sa has already ordered the MRI at [facility], so I do not see that I need to do anything. Leeroy can call [facility] to schedule it asap. Then he can see Dr. Sherwood Gambler if needed

## 2017-11-29 DIAGNOSIS — M546 Pain in thoracic spine: Secondary | ICD-10-CM | POA: Diagnosis not present

## 2017-11-29 DIAGNOSIS — M5136 Other intervertebral disc degeneration, lumbar region: Secondary | ICD-10-CM | POA: Diagnosis not present

## 2017-11-29 DIAGNOSIS — M4726 Other spondylosis with radiculopathy, lumbar region: Secondary | ICD-10-CM | POA: Diagnosis not present

## 2017-11-29 DIAGNOSIS — M5137 Other intervertebral disc degeneration, lumbosacral region: Secondary | ICD-10-CM | POA: Diagnosis not present

## 2017-11-29 DIAGNOSIS — M549 Dorsalgia, unspecified: Secondary | ICD-10-CM | POA: Diagnosis not present

## 2017-12-11 DIAGNOSIS — R768 Other specified abnormal immunological findings in serum: Secondary | ICD-10-CM | POA: Diagnosis not present

## 2017-12-11 DIAGNOSIS — Z8619 Personal history of other infectious and parasitic diseases: Secondary | ICD-10-CM | POA: Diagnosis not present

## 2017-12-13 DIAGNOSIS — M5136 Other intervertebral disc degeneration, lumbar region: Secondary | ICD-10-CM | POA: Diagnosis not present

## 2017-12-18 DIAGNOSIS — Z135 Encounter for screening for eye and ear disorders: Secondary | ICD-10-CM | POA: Diagnosis not present

## 2017-12-18 DIAGNOSIS — M5126 Other intervertebral disc displacement, lumbar region: Secondary | ICD-10-CM | POA: Diagnosis not present

## 2017-12-18 DIAGNOSIS — M4726 Other spondylosis with radiculopathy, lumbar region: Secondary | ICD-10-CM | POA: Diagnosis not present

## 2017-12-19 DIAGNOSIS — M4726 Other spondylosis with radiculopathy, lumbar region: Secondary | ICD-10-CM | POA: Diagnosis not present

## 2017-12-19 DIAGNOSIS — M5136 Other intervertebral disc degeneration, lumbar region: Secondary | ICD-10-CM | POA: Diagnosis not present

## 2017-12-19 DIAGNOSIS — Z9889 Other specified postprocedural states: Secondary | ICD-10-CM | POA: Diagnosis not present

## 2018-01-01 DIAGNOSIS — Z23 Encounter for immunization: Secondary | ICD-10-CM | POA: Diagnosis not present

## 2018-01-13 ENCOUNTER — Other Ambulatory Visit: Payer: Self-pay

## 2018-01-13 ENCOUNTER — Encounter: Payer: Self-pay | Admitting: Physical Therapy

## 2018-01-13 ENCOUNTER — Ambulatory Visit: Payer: 59 | Attending: Neurosurgery | Admitting: Physical Therapy

## 2018-01-13 DIAGNOSIS — M6281 Muscle weakness (generalized): Secondary | ICD-10-CM | POA: Diagnosis present

## 2018-01-13 DIAGNOSIS — R252 Cramp and spasm: Secondary | ICD-10-CM | POA: Diagnosis present

## 2018-01-13 DIAGNOSIS — M5442 Lumbago with sciatica, left side: Secondary | ICD-10-CM | POA: Diagnosis not present

## 2018-01-13 DIAGNOSIS — G8929 Other chronic pain: Secondary | ICD-10-CM

## 2018-01-13 DIAGNOSIS — M5441 Lumbago with sciatica, right side: Secondary | ICD-10-CM | POA: Diagnosis present

## 2018-01-13 DIAGNOSIS — R293 Abnormal posture: Secondary | ICD-10-CM

## 2018-01-13 DIAGNOSIS — M7670 Peroneal tendinitis, unspecified leg: Secondary | ICD-10-CM | POA: Insufficient documentation

## 2018-01-13 NOTE — Therapy (Signed)
[facility] [address], [place], [place], [address] Phone: [phone]   Fax:  [phone]  Physical Therapy Evaluation  Patient Details  Name: Michael Garner MRN: [id number] Date of Birth: 11-09-1955 Referring Provider (PT): Jovita Gamma, MD,   Encounter Date: 01/13/2018  PT End of Session - 01/13/18 1609    Visit Number  1    Number of Visits  20    Date for PT Re-Evaluation  03/10/18    Authorization Type  U[employer]    Authorization Time Period  01/13/18-03/10/18    Authorization - Visit Number  1    Authorization - Number of Visits  20    PT Start Time  1234    PT Stop Time  7902    PT Time Calculation (min)  43 min    Activity Tolerance  Patient tolerated treatment well    Behavior During Therapy  [facility] for tasks assessed/performed       Past Medical History:  Diagnosis Date  . BPH with urinary obstruction   . Erectile dysfunction   . Hepatitis B infection    resolved     Past Surgical History:  Procedure Laterality Date  . COLONOSCOPY  07-18-11    per Dr. Sharlett Iles, diverticulosis only, repeat in 10 yrs   . LUMBAR MICRODISCECTOMY  2002    There were no vitals filed for this visit.   Subjective Assessment - 01/13/18 1238    Subjective  Pt presents to PT with bilateral hip pain but MD ordered MRI for lumbar spine and believes his pain is related to back not hips.  Pt has a history of bilateral microdiscectomy L5/S1 (two surgeries) in 2002.  Pt reports weakness in Rt lower leg weakness, tight hamstrings Lt>Rt.  Pt denies back pain other than an occassional ache.    Pertinent History  family history of hip replacements due to OA    How long can you sit comfortably?  no limitations    How long can you stand comfortably?  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  works on feet all night at resaurant    Diagnostic tests  xray: L4/5 and L5/S1 DDD, facet  arthritis, significant spurring, lumbar MRI    Patient Stated Goals  increase exercise opportunities safely    Currently in Pain?  No/denies   can get up to a 5/10 in hip joints, Lt>Rt        [facility] PT Assessment - 01/13/18 0001      Assessment   Medical Diagnosis  M48.062 (ICD-10-CM) - Spinal stenosis, lumbar region with neurogenic claudication    Referring Provider (PT)  Jovita Gamma, MD,    Onset Date/Surgical Date  12/08/16   approx 1 year ago noted he started limping due to weakness   Next MD Visit  Jan 29 2018    Prior Therapy  yes      Precautions   Precautions  None      Restrictions   Weight Bearing Restrictions  No      Balance Screen   Has the patient fallen in the past 6 months  No    Has the patient had a decrease in activity level because of a fear of falling?   No    Is the patient reluctant to leave their home because of a fear of falling?   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, table visits    Leisure  exercise, watch sports      Cognition   Overall Cognitive Status  Within Functional Limits for tasks assessed      Observation/Other Assessments   Observations  Rt gastroc atrophy present with poor/fair recruitment during bil heel raises    Focus on Therapeutic Outcomes (FOTO)   42   37 goal     Sensation   Light Touch  Impaired Detail    Light Touch Impaired Details  Impaired RLE   5th digit lateral aspect     Tone   Assessment Location  Right Lower Extremity;Left Lower Extremity      ROM /  Strength   AROM / PROM / Strength  AROM;Strength      AROM   Overall AROM   Within functional limits for tasks performed    Overall AROM Comments  Pt with LBP with trunk flexion    AROM Assessment Site  Lumbar;Hip    Right/Left Hip  Right;Left    Right Hip External Rotation   55    Right Hip Internal Rotation   15    Left Hip External Rotation   55    Left Hip Internal Rotation   15    Lumbar Flexion  40   pain at end range   Lumbar Extension  15    Lumbar - Right Side Bend  20    Lumbar - Left Side Bend  20      Strength   Overall Strength  Within functional limits for tasks performed    Overall Strength Comments  5/5 for trunk all cardinal planes, 5/5 bil LE muscle groups with exception of noted areas    Strength Assessment Site  Knee;Ankle    Right/Left Knee  Right    Right Knee Flexion  4+/5    Right/Left Ankle  Right    Right Ankle Plantar Flexion  2+/5   Pt unable to perform unilateral heel raise      Flexibility   Soft Tissue Assessment /Muscle Length  yes    Hamstrings  limited Rt>Lt, nerve tension encountered in + SLR before hamstring stretch      Palpation   Spinal mobility  poor lumbar flexion mobs bil L3-S1    SI assessment   WNL bil      Special Tests    Special Tests  Lumbar;Hip Special Tests    Lumbar Tests  Straight Leg Raise    Hip Special Tests   Hip Scouring;Patrick (FABER) Test      Straight Leg Raise   Findings  Positive    Side   Right    Comment  at 45 degrees      Saralyn Pilar (FABER) Test   Findings  Negative    Side  Right;Left      Hip Scouring   Findings  Negative    Side  Right;Left      Ambulation/Gait   Ambulation/Gait  Yes    Gait Comments  Pt with limited push off on Rt creating uneven gait pattern      RLE Tone   RLE Tone  Moderate;Hypertonic      RLE Tone   Hypertonic Details  hamstrings, gluteals      LLE Tone   LLE Tone  Mild;Hypertonic      LLE Tone   Hypertonic Details  hamstrings, gluteals                 Objective measurements completed on examination: See above findings.      [facility] PT Treatment/Exercise - 01/13/18 0001      Exercises   Exercises  Lumbar      Lumbar Exercises: Stretches   Double Knee to Chest Stretch  5 reps;10 seconds    Other Lumbar Stretch Exercise  supine sciatic nerve flossing hip/knee 90/90, extend knee in/out of nerve tension   Rt>Lt   Other Lumbar Stretch Exercise  seated physioball rollouts               PT Short Term Goals - 01/13/18 2050      PT SHORT TERM GOAL #1   Title  Pt will be independent in initial HEP to address neural tension, flexibility restrictions, and initial core stabilization.    Time  3    Period  Weeks    Status  New    Target Date  02/03/18      PT SHORT TERM GOAL #2   Title  Pt will report at least 25% improvement in pain during 24 hour period to demonstrate improved tolerance of daily demands.    Time  4    Period  Weeks    Status  New    Target Date  02/10/18      PT SHORT TERM GOAL #3   Title  Pt will demonstrate proper body mechanics for bending, lifting and carrying with core muscle awareness to protect his back from further injury during work and household tasks.    Time  4    Period  Weeks    Status  New    Target Date  02/10/18        PT Long Term Goals - 01/13/18 2056      PT LONG TERM GOAL #1   Title  Pt will be independent in advanced HEP.    Time  8    Period  Weeks    Status  New    Target Date  03/10/18      PT LONG TERM GOAL #2   Title  Pt will reduce FOTO score to 53% or less to demonstrate less functional limitation related to his back.    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    Time  8    Period  Weeks    Status  New    Target Date   03/10/18      PT LONG TERM GOAL #4   Title  Pt will achieve improved hip rotation ROM bil to improve ease of donning/doffing shoes and socks and reduce motion  demands on lumbar spine.    Baseline  ER 55 degrees bil, IR 15 deg bil    Time  8    Period  Weeks    Status  New    Target Date  03/10/18             Plan - 01/13/18 2039    Clinical Impression Statement  Pt presents to PT with chronic history of LBP with discectomy x 2 in 2002.  He noted a change in status approximately one year ago when he developed bilateral buttock/hip pain and weakness in his Rt LE.  He reports Lt>Rt hip pain but presents with more objective findings on Rt including + SLR on Rt and weakness of gastroc (with noted atrophy) and hamstring.  He is otherwise strong but lacks mobility of bil hips and has limited segmental mobility bil L3-S1.  He reported relief with manual traction today.  He will benefit from skilled PT to address deficits in strength, flexibility, ROM, joint mobility, gait and core stability to improve his pain and increase his tolerance of daily demands, particularly at work as a Scientist, clinical (histocompatibility and immunogenetics).    History and Personal Factors relevant to plan of care:  microdiscectomies x 2 2002, atrophy and signif weakness Rt gastroc (S1 myotome)    Clinical Presentation  Stable    Clinical Decision Making  Low    Rehab Potential  Excellent    PT Frequency  2x / week    PT Duration  8 weeks    PT Treatment/Interventions  ADLs/Self Care Home Management;Biofeedback;Cryotherapy;Electrical Stimulation;Moist Heat;Traction;Ultrasound;Gait training;Functional mobility training;Therapeutic activities;Therapeutic exercise;Balance training;Neuromuscular re-education;Patient/family education;Manual techniques;Iontophoresis 4mg /ml Dexamethasone;Passive range of motion;Dry needling;Taping;Joint Manipulations;Spinal Manipulations    PT Next Visit Plan  build initial Medbridge HEP over next several visits, mechanical  lumbar traction, manual therapy for lumbar mobilization, hip ROM    PT Home Exercise Plan  build next visit (include DKTC, seated ball rollouts, supine sciatic nerve flossing)    Consulted and Agree with Plan of Care  Patient       Patient will benefit from skilled therapeutic intervention in order to improve the following deficits and impairments:  Abnormal gait, Hypomobility, Impaired sensation, Decreased activity tolerance, Decreased strength, Pain, Decreased mobility, Increased muscle spasms, Decreased range of motion, Improper body mechanics, Impaired flexibility, Postural dysfunction  Visit Diagnosis: Chronic bilateral low back pain with bilateral sciatica - Plan: PT plan of care cert/re-cert  Muscle weakness (generalized) - Plan: PT plan of care cert/re-cert  Cramp and spasm - Plan: PT plan of care cert/re-cert  Abnormal posture - Plan: PT plan of care cert/re-cert     Problem List Patient Active Problem List   Diagnosis Date Noted  . Peroneal tendinitis 01/13/2018  . Depressive disorder, not elsewhere classified 12/19/2009  . INSOMNIA 12/19/2009  . LUMBAR STRAIN 11/17/2009  . BENIGN PROSTATIC HYPERTROPHY, WITH OBSTRUCTION 09/30/2009  . SUBACROMIAL BURSITIS 05/12/2008  . HEPATITIS B, HX OF 05/12/2008   Baruch Merl, PT 01/13/18 9:17 PM    [facility] [address] [place], [place], [address] Phone: [phone]   Fax:  [phone]  Name: Michael Garner MRN: [id number] Date of Birth: [birth year]

## 2018-01-15 ENCOUNTER — Encounter: Payer: Self-pay | Admitting: Physical Therapy

## 2018-01-15 ENCOUNTER — Ambulatory Visit: Payer: 59 | Admitting: Physical Therapy

## 2018-01-15 DIAGNOSIS — R252 Cramp and spasm: Secondary | ICD-10-CM

## 2018-01-15 DIAGNOSIS — G8929 Other chronic pain: Secondary | ICD-10-CM

## 2018-01-15 DIAGNOSIS — M5441 Lumbago with sciatica, right side: Principal | ICD-10-CM

## 2018-01-15 DIAGNOSIS — M5442 Lumbago with sciatica, left side: Principal | ICD-10-CM

## 2018-01-15 DIAGNOSIS — R293 Abnormal posture: Secondary | ICD-10-CM

## 2018-01-15 DIAGNOSIS — M6281 Muscle weakness (generalized): Secondary | ICD-10-CM

## 2018-01-15 NOTE — Therapy (Signed)
[facility] [address] [place], [place], [address] Phone: [phone]   Fax:  [phone]  Physical Therapy Treatment  Patient Details  Name: Michael Garner MRN: [id number] Date of Birth: 27-Oct-1955 Referring Provider (PT): Jovita Gamma, MD,   Encounter Date: 01/15/2018  PT End of Session - 01/15/18 1328    Visit Number  2    Number of Visits  20    Date for PT Re-Evaluation  03/10/18    Authorization Type  UHC    Authorization Time Period  01/13/18-03/10/18    Authorization - Visit Number  2    Authorization - Number of Visits  20    PT Start Time  8563    PT Stop Time  1243    PT Time Calculation (min)  56 min    Activity Tolerance  Patient tolerated treatment well    Behavior During Therapy  [facility] for tasks assessed/performed       Past Medical History:  Diagnosis Date  . BPH with urinary obstruction   . Erectile dysfunction   . Hepatitis B infection    resolved     Past Surgical History:  Procedure Laterality Date  . COLONOSCOPY  07-18-11    per Dr. Sharlett Iles, diverticulosis only, repeat in 10 yrs   . LUMBAR MICRODISCECTOMY  2002    There were no vitals filed for this visit.                    [facility] Adult PT Treatment/Exercise - 01/15/18 0001      Exercises   Exercises  Lumbar      Lumbar Exercises: Stretches   Piriformis Stretch  Left;Right;20 seconds    Figure 4 Stretch  20 seconds;With overpressure    Figure 4 Stretch Limitations  bil    Other Lumbar Stretch Exercise  standing single leg T dead lift bil      Modalities   Modalities  Traction      Traction   Type of Traction  Lumbar    Min (lbs)  70    Max (lbs)  105    Hold Time  60    Rest Time  20    Time  16      Manual Therapy   Manual Therapy  Soft tissue mobilization    Soft tissue mobilization  Lt glute med, min, piriformis after dry needling       Trigger Point Dry Needling - 01/15/18 1329    Consent  Given?  Yes    Muscles Treated Lower Body  Gluteus minimus;Gluteus maximus;Piriformis   glut med   Gluteus Maximus Response  Twitch response elicited;Palpable increased muscle length    Gluteus Minimus Response  Twitch response elicited;Palpable increased muscle length    Piriformis Response  Twitch response elicited;Palpable increased muscle length             PT Short Term Goals - 01/13/18 2050      PT SHORT TERM GOAL #1   Title  Pt will be independent in initial HEP to address neural tension, flexibility restrictions, and initial core stabilization.    Time  3    Period  Weeks    Status  New    Target Date  02/03/18      PT SHORT TERM GOAL #2   Title  Pt will report at least 25% improvement in pain during 24 hour period to demonstrate improved tolerance of daily demands.  Time  4    Period  Weeks    Status  New    Target Date  02/10/18      PT SHORT TERM GOAL #3   Title  Pt will demonstrate proper body mechanics for bending, lifting and carrying with core muscle awareness to protect his back from further injury during work and household tasks.    Time  4    Period  Weeks    Status  New    Target Date  02/10/18        PT Long Term Goals - 01/13/18 2056      PT LONG TERM GOAL #1   Title  Pt will be independent in advanced HEP.    Time  8    Period  Weeks    Status  New    Target Date  03/10/18      PT LONG TERM GOAL #2   Title  Pt will reduce FOTO score to 53% or less to demonstrate less functional limitation related to his back.    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    Time  8    Period  Weeks    Status  New    Target Date  03/10/18      PT LONG TERM GOAL #4   Title  Pt will achieve improved hip rotation ROM bil to improve ease  of donning/doffing shoes and socks and reduce motion demands on lumbar spine.    Baseline  ER 55 degrees bil, IR 15 deg bil    Time  8    Period  Weeks    Status  New    Target Date  03/10/18            Plan - 01/15/18 1332    Clinical Impression Statement  Pt with good response to dry needling to hypertonic Lt hip muscles today.  He reported less tenderness and pain in these areas after treatment.  He also reported relief with mechanical lumbar traction.  Pt with ongoing flexibility and ROM deficits which will benefit from continued stretching, dry needling, STM and neural flossing.      Rehab Potential  Excellent    PT Frequency  2x / week    PT Duration  8 weeks    PT Treatment/Interventions  ADLs/Self Care Home Management;Biofeedback;Cryotherapy;Electrical Stimulation;Moist Heat;Traction;Ultrasound;Gait training;Functional mobility training;Therapeutic activities;Therapeutic exercise;Balance training;Neuromuscular re-education;Patient/family education;Manual techniques;Iontophoresis 4mg /ml Dexamethasone;Passive range of motion;Dry needling;Taping;Joint Manipulations;Spinal Manipulations    PT Next Visit Plan  f/u on dry needling and traction response, continue manual therapy and building HEP    PT Home Exercise Plan  Access Code: [email or web address]       Patient will benefit from skilled therapeutic intervention in order to improve the following deficits and impairments:  Abnormal gait, Hypomobility, Impaired sensation, Decreased activity tolerance, Decreased strength, Pain, Decreased mobility, Increased muscle spasms, Decreased range of motion, Improper body mechanics, Impaired flexibility, Postural dysfunction  Visit Diagnosis: Chronic bilateral low back pain with bilateral sciatica  Muscle weakness (generalized)  Cramp and spasm  Abnormal posture     Problem List Patient Active Problem List   Diagnosis Date Noted  . Peroneal tendinitis  01/13/2018  . Depressive disorder, not  elsewhere classified 12/19/2009  . INSOMNIA 12/19/2009  . LUMBAR STRAIN 11/17/2009  . BENIGN PROSTATIC HYPERTROPHY, WITH OBSTRUCTION 09/30/2009  . SUBACROMIAL BURSITIS 05/12/2008  . HEPATITIS B, HX OF 05/12/2008   Baruch Merl, PT 01/15/18 1:37 PM     [facility] [address], [place], [place], [address] Phone: [phone]   Fax:  [phone]  Name: Michael Garner MRN: [id number] Date of Birth: May 09, 1955

## 2018-01-15 NOTE — Patient Instructions (Signed)
Access Code: [email or web address]  URL: [email or web address]  Date: 01/15/2018  Prepared by: Venetia Night Landin Tallon   Exercises  Forward T - 10 reps - 3 sets - 1x daily - 7x weekly  Supine Figure 4 Piriformis Stretch - 3 reps - 3 sets - 30 hold - 1x daily - 7x weekly  Supine Gluteus Stretch - 3 reps - 3 sets - 30 hold - 1x daily - 7x weekly  Hooklying Single Knee to Chest Stretch - 10 reps - 2 sets - 5 hold - 1x daily - 7x weekly  Supine Sciatic Nerve Glide - 15 reps - 2 sets - 1x daily - 7x weekly

## 2018-01-20 ENCOUNTER — Ambulatory Visit: Payer: 59 | Admitting: Physical Therapy

## 2018-01-20 ENCOUNTER — Encounter: Payer: Self-pay | Admitting: Physical Therapy

## 2018-01-20 DIAGNOSIS — G8929 Other chronic pain: Secondary | ICD-10-CM

## 2018-01-20 DIAGNOSIS — M6281 Muscle weakness (generalized): Secondary | ICD-10-CM

## 2018-01-20 DIAGNOSIS — R293 Abnormal posture: Secondary | ICD-10-CM

## 2018-01-20 DIAGNOSIS — M5441 Lumbago with sciatica, right side: Principal | ICD-10-CM

## 2018-01-20 DIAGNOSIS — M5442 Lumbago with sciatica, left side: Principal | ICD-10-CM

## 2018-01-20 NOTE — Therapy (Signed)
[facility] [address], [place], [place], [address] Phone: [phone]   Fax:  [phone]  Physical Therapy Treatment  Patient Details  Name: Michael Garner MRN: [id number] Date of Birth: 07/12/1955 Referring Provider (PT): Jovita Gamma, MD,   Encounter Date: 01/20/2018  PT End of Session - 01/20/18 1147    Visit Number  3    Number of Visits  20    Date for PT Re-Evaluation  03/10/18    Authorization Type  [employer]    Authorization Time Period  01/13/18-03/10/18    Authorization - Visit Number  3    Authorization - Number of Visits  20    PT Start Time  7001    PT Stop Time  1229    PT Time Calculation (min)  42 min    Activity Tolerance  Patient tolerated treatment well    Behavior During Therapy  [facility] for tasks assessed/performed       Past Medical History:  Diagnosis Date  . BPH with urinary obstruction   . Erectile dysfunction   . Hepatitis B infection    resolved     Past Surgical History:  Procedure Laterality Date  . COLONOSCOPY  07-18-11    per Dr. Sharlett Iles, diverticulosis only, repeat in 10 yrs   . LUMBAR MICRODISCECTOMY  2002    There were no vitals filed for this visit.  Subjective Assessment - 01/20/18 1150    Subjective  Pt noticed his feet were tingling on his drive home after last visit but it didn't last.  He had more Rt hip pain than Lt during work shift last night.  He isn't sure whether the dry needling helped last visit.    Currently in Pain?  No/denies                       [facility] Adult PT Treatment/Exercise - 01/20/18 0001      Exercises   Exercises  Lumbar;Knee/Hip      Lumbar Exercises: Stretches   Piriformis Stretch  Left;Right;20 seconds    Figure 4 Stretch  20 seconds;With overpressure    Figure 4 Stretch Limitations  bil    Gastroc Stretch  Right;1 rep;30 seconds   PT cued dynamic oscillating stretch on slant board   Other Lumbar Stretch Exercise  cat  camel   10 reps   Other Lumbar Stretch Exercise  --   10 reps     Lumbar Exercises: Aerobic   Nustep  level 3 x 6'   PT present to discuss symptoms     Lumbar Exercises: Standing   Functional Squats  15 reps    Functional Squats Limitations  with dowel    PT cued hip hinge and core contraction, knee abduct   Other Standing Lumbar Exercises  single leg dead lift on Lt x 10 reps with UE support   standing marching with red tband pullforwards x 20 each   Other Standing Lumbar Exercises  squat to stand with red tband shoulder extension   PT cued core and hip hinge     Lumbar Exercises: Seated   Other Seated Lumbar Exercises  seated sciatic nerve flossing with ankle DF and knee extension, neutral trunk and neck   x 20 reps bil     Manual Therapy   Manual Therapy  --   Lt sciatic mobilization   Manual therapy comments  supine with peroneal nerve bias  PT Short Term Goals - 01/13/18 2050      PT SHORT TERM GOAL #1   Title  Pt will be independent in initial HEP to address neural tension, flexibility restrictions, and initial core stabilization.    Time  3    Period  Weeks    Status  New    Target Date  02/03/18      PT SHORT TERM GOAL #2   Title  Pt will report at least 25% improvement in pain during 24 hour period to demonstrate improved tolerance of daily demands.    Time  4    Period  Weeks    Status  New    Target Date  02/10/18      PT SHORT TERM GOAL #3   Title  Pt will demonstrate proper body mechanics for bending, lifting and carrying with core muscle awareness to protect his back from further injury during work and household tasks.    Time  4    Period  Weeks    Status  New    Target Date  02/10/18        PT Long Term Goals - 01/13/18 2056      PT LONG TERM GOAL #1   Title  Pt will be independent in advanced HEP.    Time  8    Period  Weeks    Status  New    Target Date  03/10/18      PT LONG TERM GOAL #2   Title  Pt will reduce  FOTO score to 53% or less to demonstrate less functional limitation related to his back.    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    Time  8    Period  Weeks    Status  New    Target Date  03/10/18      PT LONG TERM GOAL #4   Title  Pt will achieve improved hip rotation ROM bil to improve ease of donning/doffing shoes and socks and reduce motion demands on lumbar spine.    Baseline  ER 55 degrees bil, IR 15 deg bil    Time  8    Period  Weeks    Status  New    Target Date  03/10/18            Plan - 01/20/18 1225    Clinical Impression Statement  Pt with excellent response today to neural mobilizations for Lt LE, performed actively and passively.  He was able to walk with more symmetrical gait pattern end of session.  He required mod verbal cueing for standing squats with theraband challenge to core today.  He will continue to benefit from skilled PT to address neural tension, core stabilization, ROM and strength deficits.    Rehab Potential  Excellent    PT Frequency  2x / week    PT Duration  8 weeks    PT Next Visit Plan  add to HEP if tolerated last visit well, continue neural flossing techniques, spine ROM, LE stretches    PT Home Exercise Plan  Access Code: [email or web address]    Consulted and Agree with Plan of Care  Patient       Patient will benefit from skilled therapeutic intervention in order to improve the following deficits and impairments:  Abnormal gait, Hypomobility, Impaired sensation, Decreased activity tolerance, Decreased strength, Pain, Decreased mobility, Increased muscle spasms, Decreased range of motion, Improper body mechanics, Impaired flexibility, Postural dysfunction  Visit Diagnosis: Chronic bilateral low back pain with bilateral  sciatica  Muscle weakness (generalized)  Abnormal posture     Problem List Patient Active Problem List   Diagnosis Date Noted  . Peroneal tendinitis 01/13/2018  . Depressive disorder, not elsewhere classified 12/19/2009  . INSOMNIA 12/19/2009  . LUMBAR STRAIN 11/17/2009  . BENIGN PROSTATIC HYPERTROPHY, WITH OBSTRUCTION 09/30/2009  . SUBACROMIAL BURSITIS 05/12/2008  . HEPATITIS B, HX OF 05/12/2008   Baruch Merl, PT 01/20/18 12:29 PM    [facility] [address], [place], [place], [address] Phone: [phone]   Fax:  [phone]  Name: Azzan Butler MRN: [id number] Date of Birth: 11-28-1955

## 2018-01-22 ENCOUNTER — Encounter: Payer: Self-pay | Admitting: Physical Therapy

## 2018-01-22 ENCOUNTER — Ambulatory Visit: Payer: 59 | Admitting: Physical Therapy

## 2018-01-22 DIAGNOSIS — M5442 Lumbago with sciatica, left side: Secondary | ICD-10-CM | POA: Diagnosis not present

## 2018-01-22 DIAGNOSIS — M6281 Muscle weakness (generalized): Secondary | ICD-10-CM

## 2018-01-22 DIAGNOSIS — M5441 Lumbago with sciatica, right side: Principal | ICD-10-CM

## 2018-01-22 DIAGNOSIS — G8929 Other chronic pain: Secondary | ICD-10-CM

## 2018-01-22 DIAGNOSIS — R293 Abnormal posture: Secondary | ICD-10-CM

## 2018-01-22 NOTE — Therapy (Signed)
[facility] [address] [place], [place], [address] Phone: [phone]   Fax:  [phone]  Physical Therapy Treatment  Patient Details  Name: Michael Garner MRN: [id number] Date of Birth: 1955/10/15 Referring Provider (PT): Jovita Gamma, MD,   Encounter Date: 01/22/2018  PT End of Session - 01/22/18 1354    Visit Number  4    Number of Visits  20    Date for PT Re-Evaluation  03/10/18    Authorization Type  UHC    Authorization Time Period  01/13/18-03/10/18    Authorization - Visit Number  4    Authorization - Number of Visits  20    PT Start Time  4008    PT Stop Time  1230    PT Time Calculation (min)  43 min    Activity Tolerance  Patient tolerated treatment well    Behavior During Therapy  [facility] for tasks assessed/performed       Past Medical History:  Diagnosis Date  . BPH with urinary obstruction   . Erectile dysfunction   . Hepatitis B infection    resolved     Past Surgical History:  Procedure Laterality Date  . COLONOSCOPY  07-18-11    per Dr. Sharlett Iles, diverticulosis only, repeat in 10 yrs   . LUMBAR MICRODISCECTOMY  2002    There were no vitals filed for this visit.  Subjective Assessment - 01/22/18 1150    Subjective  Pt states he felt looser after last visit but his stiffness reset on waking the next morning, which is his normal pattern.                       [facility] Adult PT Treatment/Exercise - 01/22/18 0001      Exercises   Exercises  Lumbar;Knee/Hip      Lumbar Exercises: Stretches   Active Hamstring Stretch  Right;Left;20 seconds   flossing Lt with sciatic and peroneal bias for tension   Gastroc Stretch  Right;1 rep;30 seconds   PT cued dynamic oscillating stretch on slant board   Other Lumbar Stretch Exercise  standing sciatic nerve oscillation stretch Lt foot on riser    Other Lumbar Stretch Exercise  Standing sidebending x 20 reps, standing thoracic rotation  with dowel across shoulders x 20 reps      Lumbar Exercises: Aerobic   Recumbent Bike  level 2x 8'      Lumbar Exercises: Standing   Other Standing Lumbar Exercises  reactive isometrics walk forwards with green tband x 10 x 5 sec holds   PT cued standing posture with core contractions   Other Standing Lumbar Exercises  marching with green (Rt) and blue (Lt) tband shoulder extension               PT Short Term Goals - 01/13/18 2050      PT SHORT TERM GOAL #1   Title  Pt will be independent in initial HEP to address neural tension, flexibility restrictions, and initial core stabilization.    Time  3    Period  Weeks    Status  New    Target Date  02/03/18      PT SHORT TERM GOAL #2   Title  Pt will report at least 25% improvement in pain during 24 hour period to demonstrate improved tolerance of daily demands.    Time  4    Period  Weeks    Status  New  Target Date  02/10/18      PT SHORT TERM GOAL #3   Title  Pt will demonstrate proper body mechanics for bending, lifting and carrying with core muscle awareness to protect his back from further injury during work and household tasks.    Time  4    Period  Weeks    Status  New    Target Date  02/10/18        PT Long Term Goals - 01/13/18 2056      PT LONG TERM GOAL #1   Title  Pt will be independent in advanced HEP.    Time  8    Period  Weeks    Status  New    Target Date  03/10/18      PT LONG TERM GOAL #2   Title  Pt will reduce FOTO score to 53% or less to demonstrate less functional limitation related to his back.    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    Time  8    Period  Weeks    Status  New    Target Date  03/10/18      PT LONG TERM GOAL #4   Title  Pt will achieve  improved hip rotation ROM bil to improve ease of donning/doffing shoes and socks and reduce motion demands on lumbar spine.    Baseline  ER 55 degrees bil, IR 15 deg bil    Time  8    Period  Weeks    Status  New    Target Date  03/10/18            Plan - 01/22/18 1354    Clinical Impression Statement  Pt reported he has responded well to PT and HEP so far.  He wakes up with bil hip stiffness so PT spent time building out further HEP to include neural mobilization, spine ROM, LE stretches and core exercises for Pt to utilize to address deficits which will improve his daily transition in the morning into a more comfortable active day.  He responds well to neural mobilization and gains range with repeated oscillations of Lt LE.  His awareness of core use during standing functional postures and movements is improving.  He will continue to benefit from skilled intervention to address his pain and deficits.    Rehab Potential  Excellent    PT Frequency  2x / week    PT Duration  8 weeks    PT Treatment/Interventions  ADLs/Self Care Home Management;Biofeedback;Cryotherapy;Electrical Stimulation;Moist Heat;Traction;Ultrasound;Gait training;Functional mobility training;Therapeutic activities;Therapeutic exercise;Balance training;Neuromuscular re-education;Patient/family education;Manual techniques;Iontophoresis 4mg /ml Dexamethasone;Passive range of motion;Dry needling;Taping;Joint Manipulations;Spinal Manipulations    PT Next Visit Plan  f/u on additions to HEP, morning stiffness pattern    PT Home Exercise Plan  Access Code: [email or web address]    Consulted and Agree with Plan of Care  Patient       Patient will benefit from skilled therapeutic intervention in order to improve the following deficits and impairments:  Abnormal gait, Hypomobility, Impaired sensation, Decreased activity tolerance, Decreased strength, Pain, Decreased mobility, Increased muscle spasms, Decreased range of motion, Improper body  mechanics, Impaired flexibility, Postural dysfunction  Visit Diagnosis: Chronic bilateral low back pain with bilateral sciatica  Muscle weakness (generalized)  Abnormal posture     Problem List Patient Active Problem List   Diagnosis Date Noted  . Peroneal tendinitis 01/13/2018  . Depressive disorder, not elsewhere classified 12/19/2009  . INSOMNIA 12/19/2009  . LUMBAR STRAIN 11/17/2009  . BENIGN PROSTATIC HYPERTROPHY, WITH OBSTRUCTION 09/30/2009  . SUBACROMIAL BURSITIS 05/12/2008  . HEPATITIS B, HX OF 05/12/2008   Baruch Merl, PT 01/22/18 1:59 PM   [facility] [address] [place], [place], [address] Phone: [phone]   Fax:  [phone]  Name: Michael Garner MRN: [id number] Date of Birth: Nov 19, 1955

## 2018-01-27 ENCOUNTER — Ambulatory Visit: Payer: 59 | Admitting: Physical Therapy

## 2018-01-27 ENCOUNTER — Encounter: Payer: Self-pay | Admitting: Physical Therapy

## 2018-01-27 DIAGNOSIS — M5442 Lumbago with sciatica, left side: Principal | ICD-10-CM

## 2018-01-27 DIAGNOSIS — R293 Abnormal posture: Secondary | ICD-10-CM

## 2018-01-27 DIAGNOSIS — M5441 Lumbago with sciatica, right side: Principal | ICD-10-CM

## 2018-01-27 DIAGNOSIS — G8929 Other chronic pain: Secondary | ICD-10-CM

## 2018-01-27 DIAGNOSIS — M6281 Muscle weakness (generalized): Secondary | ICD-10-CM

## 2018-01-27 NOTE — Patient Instructions (Signed)
Explanation about umbilical hernia, avoid crunches, prone plank.  Benefits of deep core and how to exercise it more safely to avoid worsening of hernia.

## 2018-01-27 NOTE — Therapy (Signed)
[facility] [address] [place], [place], [address] Phone: [phone]   Fax:  [phone]  Physical Therapy Treatment  Patient Details  Name: Michael Garner MRN: [id number] Date of Birth: 11-12-1955 Referring Provider (PT): Jovita Gamma, MD,   Encounter Date: 01/27/2018  PT End of Session - 01/27/18 1321    Visit Number  5    Number of Visits  20    Date for PT Re-Evaluation  03/10/18    Authorization Type  UHC    Authorization Time Period  01/13/18-03/10/18    Authorization - Visit Number  5    Authorization - Number of Visits  20    PT Start Time  2694    PT Stop Time  1316    PT Time Calculation (min)  40 min    Activity Tolerance  Patient tolerated treatment well    Behavior During Therapy  [facility] for tasks assessed/performed       Past Medical History:  Diagnosis Date  . BPH with urinary obstruction   . Erectile dysfunction   . Hepatitis B infection    resolved     Past Surgical History:  Procedure Laterality Date  . COLONOSCOPY  07-18-11    per Dr. Sharlett Iles, diverticulosis only, repeat in 10 yrs   . LUMBAR MICRODISCECTOMY  2002    There were no vitals filed for this visit.  Subjective Assessment - 01/27/18 1242    Subjective  Pt states he has less pain but still has to sit and stretch and take pain meds about halfway through a work shift. He is starting to feel some financial strain with the co-pays and other medical bills and may not be able to continue as scheduled.  (Pended)                        [facility] Adult PT Treatment/Exercise - 01/27/18 0001      Exercises   Exercises  Lumbar;Knee/Hip      Lumbar Exercises: Aerobic   Recumbent Bike  level 3 x 10'      Lumbar Exercises: Standing   Other Standing Lumbar Exercises  standing on balance discs with UE movements    Other Standing Lumbar Exercises  SLS with body blade vertical and horizontal at 90 degrees shoulder flexion    bil x 30 sec each position, each leg     Knee/Hip Exercises: Standing   Heel Raises  Both;10 reps   PT cued to perform eccentric lowering on Rt with Lt assist   Forward Lunges  Both;10 reps    Forward Lunges Limitations  static with 10# med ball rotations over front LE    Other Standing Knee Exercises  T walks (single leg dead lift) with 10# med ball bil x 10             PT Education - 01/27/18 1321    Education Details  umbilical hernia management, exercises to avoid/perform    Person(s) Educated  Patient    Methods  Explanation    Comprehension  Verbalized understanding       PT Short Term Goals - 01/27/18 1238      PT SHORT TERM GOAL #1   Title  Pt will be independent in initial HEP to address neural tension, flexibility restrictions, and initial core stabilization.    Time  3    Period  Weeks    Status  Achieved  PT SHORT TERM GOAL #2   Title  Pt will report at least 25% improvement in pain during 24 hour period to demonstrate improved tolerance of daily demands.    Time  4    Period  Weeks    Status  On-going   Pt reports he has to sit and stretch and take pain meds approx halfway through his work shift.       PT Long Term Goals - 01/13/18 2056      PT LONG TERM GOAL #1   Title  Pt will be independent in advanced HEP.    Time  8    Period  Weeks    Status  New    Target Date  03/10/18      PT LONG TERM GOAL #2   Title  Pt will reduce FOTO score to 53% or less to demonstrate less functional limitation related to his back.    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    Time  8    Period  Weeks    Status  New    Target Date  03/10/18      PT LONG TERM GOAL #4   Title  Pt will achieve improved hip rotation ROM bil to improve  ease of donning/doffing shoes and socks and reduce motion demands on lumbar spine.    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  His neural tension through Lt LE continues to be improved with neural flossing techniques which are in his HEP.  He stated for financial reasons he needs to back down to 1x/week vs 2.  He inquired about an umbilical hernia today and PT discussed how to manage this with exercises to avoid vs perform.  He has access to a gym and wanted more ideas for safe strengthening for back and abdominals which was covered by PT today.  He has weakness in Rt LE which challenged him during closed chain exercises today, notably his static lunges.  He will continue to benefit from skilled PT to address pain and deficits and transition to HEP over time.    Rehab Potential  Excellent    PT Frequency  2x / week    PT Duration  8 weeks    PT Treatment/Interventions  ADLs/Self Care Home Management;Biofeedback;Cryotherapy;Electrical Stimulation;Moist Heat;Traction;Ultrasound;Gait training;Functional mobility training;Therapeutic activities;Therapeutic exercise;Balance training;Neuromuscular re-education;Patient/family education;Manual techniques;Iontophoresis 4mg /ml Dexamethasone;Passive range of motion;Dry needling;Taping;Joint Manipulations;Spinal Manipulations    PT Next Visit Plan  add standing balance discs with UE weights and diagonal pulley abdominal strengthening, continue body blade SLS core training    PT Home Exercise Plan  Access Code: [email or web address]    Consulted and Agree with Plan of Care  Patient  Patient will benefit from skilled therapeutic intervention in order to improve the  following deficits and impairments:  Abnormal gait, Hypomobility, Impaired sensation, Decreased activity tolerance, Decreased strength, Pain, Decreased mobility, Increased muscle spasms, Decreased range of motion, Improper body mechanics, Impaired flexibility, Postural dysfunction  Visit Diagnosis: Chronic bilateral low back pain with bilateral sciatica  Muscle weakness (generalized)  Abnormal posture     Problem List Patient Active Problem List   Diagnosis Date Noted  . Peroneal tendinitis 01/13/2018  . Depressive disorder, not elsewhere classified 12/19/2009  . INSOMNIA 12/19/2009  . LUMBAR STRAIN 11/17/2009  . BENIGN PROSTATIC HYPERTROPHY, WITH OBSTRUCTION 09/30/2009  . SUBACROMIAL BURSITIS 05/12/2008  . HEPATITIS B, HX OF 05/12/2008   Baruch Merl, PT 01/27/18 1:28 PM    [facility] [address] [place], [place], [address] Phone: [phone]   Fax:  [phone]  Name: Michael Garner MRN: [id number] Date of Birth: July 14, 1955

## 2018-01-29 DIAGNOSIS — Z9889 Other specified postprocedural states: Secondary | ICD-10-CM | POA: Diagnosis not present

## 2018-01-29 DIAGNOSIS — M5137 Other intervertebral disc degeneration, lumbosacral region: Secondary | ICD-10-CM | POA: Diagnosis not present

## 2018-01-29 DIAGNOSIS — M48062 Spinal stenosis, lumbar region with neurogenic claudication: Secondary | ICD-10-CM | POA: Diagnosis not present

## 2018-02-04 ENCOUNTER — Encounter: Payer: Self-pay | Admitting: Physical Therapy

## 2018-02-04 ENCOUNTER — Ambulatory Visit: Payer: 59 | Admitting: Physical Therapy

## 2018-02-04 DIAGNOSIS — M5442 Lumbago with sciatica, left side: Secondary | ICD-10-CM | POA: Diagnosis not present

## 2018-02-04 DIAGNOSIS — M6281 Muscle weakness (generalized): Secondary | ICD-10-CM

## 2018-02-04 DIAGNOSIS — M5441 Lumbago with sciatica, right side: Principal | ICD-10-CM

## 2018-02-04 DIAGNOSIS — G8929 Other chronic pain: Secondary | ICD-10-CM

## 2018-02-04 DIAGNOSIS — R293 Abnormal posture: Secondary | ICD-10-CM

## 2018-02-04 NOTE — Therapy (Signed)
[facility] [address] [place], [place], [address] Phone: [phone]   Fax:  [phone]  Physical Therapy Treatment  Patient Details  Name: Michael Garner MRN: [id number] Date of Birth: November 27, 1955 Referring Provider (PT): Jovita Gamma, MD,   Encounter Date: 02/04/2018  PT End of Session - 02/04/18 1208    Visit Number  6    Number of Visits  20    Date for PT Re-Evaluation  03/10/18    Authorization Type  UHC    Authorization Time Period  01/13/18-03/10/18    Authorization - Visit Number  6    Authorization - Number of Visits  20    PT Start Time  1150    PT Stop Time  1233    PT Time Calculation (min)  43 min    Activity Tolerance  Patient tolerated treatment well    Behavior During Therapy  [facility] for tasks assessed/performed       Past Medical History:  Diagnosis Date  . BPH with urinary obstruction   . Erectile dysfunction   . Hepatitis B infection    resolved     Past Surgical History:  Procedure Laterality Date  . COLONOSCOPY  07-18-11    per Dr. Sharlett Iles, diverticulosis only, repeat in 10 yrs   . LUMBAR MICRODISCECTOMY  2002    There were no vitals filed for this visit.  Subjective Assessment - 02/04/18 1152    Subjective  Pt states he went to the gym 3x last week and felt great doing the elliptical and his HEP there.  He felt no pain and much looser with the exercises.  I was supposed to have injections Thursday but they got rescheduled to Nov 7.      Pertinent History  family history of hip replacements due to OA    How long can you sit comfortably?  no limitations    How long can you stand comfortably?  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  works on feet all night at resaurant    Diagnostic tests  xray: L4/5 and L5/S1 DDD, facet arthritis, significant spurring, lumbar MRI    Patient Stated Goals  increase exercise opportunities safely     Currently in Pain?  Yes    Pain Score  2     Pain Location  Back    Pain Orientation  Lower    Pain Descriptors / Indicators  Dull    Pain Type  Chronic pain    Pain Onset  More than a month ago    Pain Frequency  Intermittent                       [facility] Adult PT Treatment/Exercise - 02/04/18 0001      Exercises   Exercises  Shoulder;Lumbar;Knee/Hip      Lumbar Exercises: Stretches   Other Lumbar Stretch Exercise  sciatic flosssing bil with strap      Lumbar Exercises: Aerobic   Recumbent Bike  level 3 x 6 min      Lumbar Exercises: Machines for Strengthening   Other Lumbar Machine Exercise  squats with 35# low cables    Other Lumbar Machine Exercise  rows in mini squat with core cueing 35#      Lumbar Exercises: Standing   Heel Raises  15 reps   emphasis on Rt eccentric lowering   Functional Squats  20 reps    Functional Squats Limitations  with blue tband pullforwards bil    Forward Lunge  10 reps    Forward Lunge Limitations  blue medicine ball rotations    Other Standing Lumbar Exercises  standing on balance pad 5# dumbells bil shoulder lat raise/fwd raise alternating with core cueing    Other Standing Lumbar Exercises  marching on balance pad   PT cued slow weight shift for hip and core muscle awareness              PT Short Term Goals - 02/04/18 1214      PT SHORT TERM GOAL #3   Title  Pt will demonstrate proper body mechanics for bending, lifting and carrying with core muscle awareness to protect his back from further injury during work and household tasks.    Time  4    Period  Weeks    Status  Achieved        PT Long Term Goals - 01/13/18 2056      PT LONG TERM GOAL #1   Title  Pt will be independent in advanced HEP.    Time  8    Period  Weeks    Status  New    Target Date  03/10/18      PT LONG TERM GOAL #2   Title  Pt will reduce FOTO score to 53% or less to demonstrate less functional limitation related to his back.     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    Time  8    Period  Weeks    Status  New    Target Date  03/10/18      PT LONG TERM GOAL #4   Title  Pt will achieve improved hip rotation ROM bil to improve ease of donning/doffing shoes and socks and reduce motion demands on lumbar spine.    Baseline  ER 55 degrees bil, IR 15 deg bil    Time  8    Period  Weeks    Status  New    Target Date  03/10/18            Plan - 02/04/18 1233    Clinical Impression Statement  Pt has been able to return to the gym to increase aerobic exercise and strength training.  He reported he did his HEP three times in addition to gym exercise and stated he was pain free afterwards.  He continues to have pain during his work shift due to work demands of standing/bending/lifting.  He continues to fight sciatic neural tension throughout [employer] but he is able to improve mobility with commitment to neural flossing techniques which he shows good demonstration of.  He will continue to benefit from skilled PT to address mobility, strength deficits and pain.    Rehab Potential  Excellent    PT Frequency  2x / week    PT Duration  8 weeks    PT Treatment/Interventions  ADLs/Self Care Home Management;Biofeedback;Cryotherapy;Electrical Stimulation;Moist Heat;Traction;Ultrasound;Gait training;Functional mobility training;Therapeutic activities;Therapeutic exercise;Balance training;Neuromuscular re-education;Patient/family education;Manual techniques;Iontophoresis 4mg /ml Dexamethasone;Passive range of motion;Dry needling;Taping;Joint Manipulations;Spinal Manipulations    PT Next Visit Plan  continue core stabilization    PT Home Exercise Plan  Access Code: [email or web address]    Consulted and Agree with Plan of Care   Patient       Patient will benefit from skilled therapeutic intervention in order to improve the following deficits and impairments:  Abnormal gait, Hypomobility, Impaired sensation, Decreased activity tolerance, Decreased strength, Pain, Decreased mobility, Increased muscle spasms, Decreased range of motion, Improper body mechanics, Impaired flexibility, Postural dysfunction  Visit Diagnosis: Chronic bilateral low back pain with bilateral sciatica  Muscle weakness (generalized)  Abnormal posture     Problem List Patient Active Problem List   Diagnosis Date Noted  . Peroneal tendinitis 01/13/2018  . Depressive disorder, not elsewhere classified 12/19/2009  . INSOMNIA 12/19/2009  . LUMBAR STRAIN 11/17/2009  . BENIGN PROSTATIC HYPERTROPHY, WITH OBSTRUCTION 09/30/2009  . SUBACROMIAL BURSITIS 05/12/2008  . HEPATITIS B, HX OF 05/12/2008   Baruch Merl, PT 02/04/18 12:34 PM    [facility] [address], [place], [place], [address] Phone: [phone]   Fax:  [phone]  Name: Tamara Kenyon MRN: [id number] Date of Birth: 08-05-1955

## 2018-02-10 ENCOUNTER — Encounter: Payer: 59 | Admitting: Physical Therapy

## 2018-02-12 ENCOUNTER — Ambulatory Visit: Payer: 59 | Attending: Neurosurgery | Admitting: Physical Therapy

## 2018-02-12 ENCOUNTER — Encounter: Payer: Self-pay | Admitting: Physical Therapy

## 2018-02-12 DIAGNOSIS — R293 Abnormal posture: Secondary | ICD-10-CM | POA: Diagnosis present

## 2018-02-12 DIAGNOSIS — M5441 Lumbago with sciatica, right side: Secondary | ICD-10-CM | POA: Insufficient documentation

## 2018-02-12 DIAGNOSIS — M6281 Muscle weakness (generalized): Secondary | ICD-10-CM | POA: Diagnosis present

## 2018-02-12 DIAGNOSIS — G8929 Other chronic pain: Secondary | ICD-10-CM | POA: Diagnosis present

## 2018-02-12 DIAGNOSIS — M5442 Lumbago with sciatica, left side: Secondary | ICD-10-CM | POA: Insufficient documentation

## 2018-02-12 NOTE — Therapy (Addendum)
[facility] [address] [place], [place], [address] Phone: [phone]   Fax:  [phone]  Physical Therapy Treatment  Patient Details  Name: Michael Garner MRN: [id number] Date of Birth: 1955/07/29 Referring Provider (PT): Jovita Gamma, MD,   Encounter Date: 02/12/2018  PT End of Session - 02/12/18 1153    Visit Number  7    Number of Visits  20    Date for PT Re-Evaluation  03/10/18    Authorization Type  UHC    Authorization Time Period  01/13/18-03/10/18    Authorization - Visit Number  7    Authorization - Number of Visits  20    PT Start Time  1540    PT Stop Time  1231    PT Time Calculation (min)  46 min    Activity Tolerance  Patient tolerated treatment well    Behavior During Therapy  [facility] for tasks assessed/performed       Past Medical History:  Diagnosis Date  . BPH with urinary obstruction   . Erectile dysfunction   . Hepatitis B infection    resolved     Past Surgical History:  Procedure Laterality Date  . COLONOSCOPY  07-18-11    per Dr. Sharlett Iles, diverticulosis only, repeat in 10 yrs   . LUMBAR MICRODISCECTOMY  2002    There were no vitals filed for this visit.  Subjective Assessment - 02/12/18 1146    Subjective  Pt states he has been to the gym 3x since last visit and feels good with the exercises.  They help his pain.  He even gets through part of his days without pain meds.  Has been off for a week from work which has been helpful for his back pain.    Pertinent History  family history of hip replacements due to OA    Currently in Pain?  No/denies         [facility] PT Assessment - 02/12/18 0001      Observation/Other Assessments   Focus on Therapeutic Outcomes (FOTO)   26                   [facility] Adult PT Treatment/Exercise - 02/12/18 0001      Exercises   Exercises  Lumbar;Knee/Hip      Lumbar Exercises: Stretches   Active Hamstring Stretch  Left;60 seconds     Active Hamstring Stretch Limitations  oscillating with strap for sciatic flossing    Hip Flexor Stretch  Right;Left;10 seconds;3 reps    Hip Flexor Stretch Limitations  on stairs in lunge    Standing Side Bend  Left;Right    Standing Side Bend Limitations  20 reps, no hold    Figure 4 Stretch  20 seconds;2 reps    Figure 4 Stretch Limitations  seated    Other Lumbar Stretch Exercise  wall board gastroc oscillating for neural tension    Other Lumbar Stretch Exercise  trunk rotation ROM with dowel across upper back in bil UEs x 20       Lumbar Exercises: Aerobic   Recumbent Bike  level 3 x 10 min      Lumbar Exercises: Machines for Strengthening   Other Lumbar Machine Exercise  diagonal pulley 45# high to low bil 2x15      Lumbar Exercises: Standing   Forward Lunge  20 reps    Forward Lunge Limitations  blue weighted ball lunge walks with ball rotation over front leg  Other Standing Lumbar Exercises  standing on foam pad body blade vertical and horizontal at 90 deg shoulder flexion x 30 sec each, bil    Other Standing Lumbar Exercises  standing on foam pad 5# dumbbell UE forward raises, lateral raises alt, 20 reps               PT Short Term Goals - 02/12/18 1149      PT SHORT TERM GOAL #2   Title  Pt will report at least 25% improvement in pain during 24 hour period to demonstrate improved tolerance of daily demands.    Time  4    Period  Weeks    Status  On-going   Pt states he feels 20% improvement during work shift, 25%+ during days he exercises       PT Long Term Goals - 02/12/18 1332      PT LONG TERM GOAL #2   Title  Pt will reduce FOTO score to 53% or less to demonstrate less functional limitation related to his back.    Time  8    Period  Weeks    Status  Achieved   26%     PT LONG TERM GOAL #4   Title  Pt will achieve improved hip rotation ROM bil to improve ease of donning/doffing shoes and socks and reduce motion demands on lumbar spine.    Time  8     Period  Weeks    Status  Achieved            Plan - 02/12/18 1231    Clinical Impression Statement  Pt continues to benefit from ther ex and HEP.  He manages Lt LE neural tension successfully with neural mobilizations and is making good progress with trunk stabilization.  He is beginning to practice his HEP at his gym and reports less pain with exercise.  He will continue to benefit from progressing understanding of more complex exercises with proper performance with eventual d/c to HEP in approx 3 visits.    Rehab Potential  Excellent    PT Frequency  2x / week    PT Duration  8 weeks    PT Treatment/Interventions  ADLs/Self Care Home Management;Biofeedback;Cryotherapy;Electrical Stimulation;Moist Heat;Traction;Ultrasound;Gait training;Functional mobility training;Therapeutic activities;Therapeutic exercise;Balance training;Neuromuscular re-education;Patient/family education;Manual techniques;Iontophoresis 51m/ml Dexamethasone;Passive range of motion;Dry needling;Taping;Joint Manipulations;Spinal Manipulations    PT Next Visit Plan  continue active neural mobs, standing core on balance pad, cable pulleys, LE strengthening, core stab    PT Home Exercise Plan  Access Code: [email or web address]    Consulted and Agree with Plan of Care  Patient       Patient will benefit from skilled therapeutic intervention in order to improve the following deficits and impairments:  Abnormal gait, Hypomobility, Impaired sensation, Decreased activity tolerance, Decreased strength, Pain, Decreased mobility, Increased muscle spasms, Decreased range of motion, Improper body mechanics, Impaired flexibility, Postural dysfunction  Visit Diagnosis: Chronic bilateral low back pain with bilateral sciatica  Muscle weakness (generalized)  Abnormal posture     Problem List Patient Active Problem List   Diagnosis Date Noted  . Peroneal tendinitis 01/13/2018  . Depressive disorder, not elsewhere classified 12/19/2009   . INSOMNIA 12/19/2009  . LUMBAR STRAIN 11/17/2009  . BENIGN PROSTATIC HYPERTROPHY, WITH OBSTRUCTION 09/30/2009  . SUBACROMIAL BURSITIS 05/12/2008  . HEPATITIS B, HX OF 05/12/2008      JBaruch Merl PT 02/12/18 1:34 PM    PHYSICAL THERAPY DISCHARGE SUMMARY  Visits from Start of Care: 7  Current  functional level related to goals / functional outcomes: See above   Remaining deficits: See above  Education / Equipment: HEP Plan: Patient agrees to discharge.  Patient goals were partially met. Patient is being discharged due to not returning since the last visit.  ?????         Baruch Merl, PT 03/11/18 5:56 AM   [facility] [address] [place], [place], [address] Phone: [phone]   Fax:  [phone]  Name: Keishon Chavarin MRN: [id number] Date of Birth: 1956/01/15

## 2018-02-13 DIAGNOSIS — M4726 Other spondylosis with radiculopathy, lumbar region: Secondary | ICD-10-CM | POA: Diagnosis not present

## 2018-02-13 DIAGNOSIS — M48061 Spinal stenosis, lumbar region without neurogenic claudication: Secondary | ICD-10-CM | POA: Diagnosis not present

## 2018-02-13 DIAGNOSIS — M5136 Other intervertebral disc degeneration, lumbar region: Secondary | ICD-10-CM | POA: Diagnosis not present

## 2018-02-24 ENCOUNTER — Encounter: Payer: 59 | Admitting: Physical Therapy

## 2018-02-26 ENCOUNTER — Encounter: Payer: 59 | Admitting: Physical Therapy

## 2018-04-09 DIAGNOSIS — M1990 Unspecified osteoarthritis, unspecified site: Secondary | ICD-10-CM

## 2018-04-09 HISTORY — DX: Unspecified osteoarthritis, unspecified site: M19.90

## 2018-05-12 DIAGNOSIS — M6281 Muscle weakness (generalized): Secondary | ICD-10-CM | POA: Diagnosis not present

## 2018-05-12 DIAGNOSIS — M545 Low back pain: Secondary | ICD-10-CM | POA: Diagnosis not present

## 2018-05-12 DIAGNOSIS — M79604 Pain in right leg: Secondary | ICD-10-CM | POA: Diagnosis not present

## 2018-05-13 DIAGNOSIS — M6281 Muscle weakness (generalized): Secondary | ICD-10-CM | POA: Diagnosis not present

## 2018-05-13 DIAGNOSIS — M545 Low back pain: Secondary | ICD-10-CM | POA: Diagnosis not present

## 2018-05-13 DIAGNOSIS — M79604 Pain in right leg: Secondary | ICD-10-CM | POA: Diagnosis not present

## 2018-05-13 DIAGNOSIS — M79605 Pain in left leg: Secondary | ICD-10-CM | POA: Diagnosis not present

## 2018-05-13 DIAGNOSIS — M5418 Radiculopathy, sacral and sacrococcygeal region: Secondary | ICD-10-CM | POA: Diagnosis not present

## 2018-05-14 DIAGNOSIS — M545 Low back pain: Secondary | ICD-10-CM | POA: Diagnosis not present

## 2018-05-14 DIAGNOSIS — Z01818 Encounter for other preprocedural examination: Secondary | ICD-10-CM | POA: Diagnosis not present

## 2018-05-14 DIAGNOSIS — M79604 Pain in right leg: Secondary | ICD-10-CM | POA: Diagnosis not present

## 2018-05-15 DIAGNOSIS — R0683 Snoring: Secondary | ICD-10-CM | POA: Diagnosis not present

## 2018-05-16 ENCOUNTER — Emergency Department (HOSPITAL_COMMUNITY): Payer: 59

## 2018-05-16 ENCOUNTER — Emergency Department (HOSPITAL_COMMUNITY)
Admission: EM | Admit: 2018-05-16 | Discharge: 2018-05-16 | Disposition: A | Discharge status: Home / Self Care | Payer: 59 | Attending: Emergency Medicine | Admitting: Emergency Medicine

## 2018-05-16 ENCOUNTER — Encounter: Payer: Self-pay | Admitting: Family Medicine

## 2018-05-16 ENCOUNTER — Telehealth (HOSPITAL_COMMUNITY): Payer: Self-pay | Admitting: *Deleted

## 2018-05-16 ENCOUNTER — Encounter (HOSPITAL_COMMUNITY): Payer: Self-pay

## 2018-05-16 DIAGNOSIS — R000 Tachycardia, unspecified: Secondary | ICD-10-CM | POA: Diagnosis not present

## 2018-05-16 DIAGNOSIS — I4891 Unspecified atrial fibrillation: Secondary | ICD-10-CM | POA: Diagnosis not present

## 2018-05-16 DIAGNOSIS — R079 Chest pain, unspecified: Secondary | ICD-10-CM | POA: Diagnosis not present

## 2018-05-16 DIAGNOSIS — R002 Palpitations: Secondary | ICD-10-CM | POA: Diagnosis not present

## 2018-05-16 HISTORY — PX: CARDIOVERSION: SHX1299

## 2018-05-16 LAB — BASIC METABOLIC PANEL
Anion gap: 17 — ABNORMAL HIGH (ref 5–15)
BUN: 14 mg/dL (ref 8–23)
CO2: 19 mmol/L — ABNORMAL LOW (ref 22–32)
Calcium: 9.5 mg/dL (ref 8.9–10.3)
Chloride: 103 mmol/L (ref 98–111)
Creatinine, Ser: 0.72 mg/dL (ref 0.61–1.24)
GFR calc Af Amer: >60 mL/min (ref >60)
GFR calc non Af Amer: >60 mL/min (ref >60)
Glucose, Bld: 129 mg/dL — ABNORMAL HIGH (ref 70–99)
Potassium: 3.9 mmol/L (ref 3.5–5.1)
Sodium: 139 mmol/L (ref 135–145)

## 2018-05-16 LAB — CBC
HCT: 45.8 % (ref 39.0–52.0)
Hemoglobin: 15.6 g/dL (ref 13.0–17.0)
MCH: 30.4 pg (ref 26.0–34.0)
MCHC: 34.1 g/dL (ref 30.0–36.0)
MCV: 89.1 fL (ref 80.0–100.0)
Platelets: 196 10*3/uL (ref 150–400)
RBC: 5.14 MIL/uL (ref 4.22–5.81)
RDW: 12.9 % (ref 11.5–15.5)
WBC: 4.3 10*3/uL (ref 4.0–10.5)
nRBC: 0 % (ref 0.0–0.2)

## 2018-05-16 LAB — TSH: TSH: 3.347 u[IU]/mL (ref 0.350–4.500)

## 2018-05-16 MED ORDER — METOPROLOL TARTRATE 25 MG PO TABS
12.5000 mg | ORAL_TABLET | Freq: Once | ORAL | Status: AC
  Administered 2018-05-16: 12.5 mg via ORAL
  Filled 2018-05-16: qty 1

## 2018-05-16 MED ORDER — APIXABAN 5 MG PO TABS: 5.0000 mg | ORAL_TABLET | Freq: Two times a day (BID) | ORAL | 0 refills | Status: DC

## 2018-05-16 MED ORDER — METOPROLOL TARTRATE 25 MG PO TABS: 12.5000 mg | ORAL_TABLET | Freq: Two times a day (BID) | ORAL | 0 refills | Status: DC

## 2018-05-16 MED ORDER — PROPOFOL 10 MG/ML IV BOLUS
0.5000 mg/kg | Freq: Once | INTRAVENOUS | Status: DC
  Filled 2018-05-16: qty 20

## 2018-05-16 MED ORDER — PROPOFOL 10 MG/ML IV BOLUS
INTRAVENOUS | Status: AC | PRN
  Administered 2018-05-16: 54.45 mg via INTRAVENOUS
  Administered 2018-05-16: 30 mg via INTRAVENOUS

## 2018-05-16 NOTE — Telephone Encounter (Signed)
It would be much easier for him to make an OV so we could go over this together

## 2018-05-16 NOTE — ED Notes (Signed)
Pt discharged from ED; instructions provided and scripts given; Pt encouraged to return to ED if symptoms worsen and to f/u with PCP; Pt verbalized understanding of all instructions 

## 2018-05-16 NOTE — Telephone Encounter (Signed)
FYI for Dr. Fry  

## 2018-05-16 NOTE — ED Provider Notes (Signed)
[place] EMERGENCY DEPARTMENT Provider Note   CSN: [id number] Arrival date & time: 05/16/18  0143     History   Chief Complaint Chief Complaint  Patient presents with  . Atrial Fibrillation    HPI Michael Garner is a 63 y.o. male.  Patient with no pertinent past medical history presents to the emergency department with a chief complaint of palpitations.  He states that he noticed the symptoms at about 12:45 AM.  He was watching TV.  States that it feels like his heart is racing.  His apple watch registered a heart rate of 190, and prompted him to call 911.  When EMS arrived, he was noted to be in A. fib with RVR with heart rates ranging between 160-180.  He was given 10 mg of Lopressor in route.  He denies any pain or shortness of breath.  Denies ever having had this before.  States that he does have chronic back problems, and was recently prescribed prednisone which he began taking this morning.  The history is provided by the patient. No language interpreter was used.    Past Medical History:  Diagnosis Date  . BPH with urinary obstruction   . Erectile dysfunction   . Hepatitis B infection    resolved     Patient Active Problem List   Diagnosis Date Noted  . Peroneal tendinitis 01/13/2018  . Depressive disorder, not elsewhere classified 12/19/2009  . INSOMNIA 12/19/2009  . LUMBAR STRAIN 11/17/2009  . BENIGN PROSTATIC HYPERTROPHY, WITH OBSTRUCTION 09/30/2009  . SUBACROMIAL BURSITIS 05/12/2008  . HEPATITIS B, HX OF 05/12/2008    Past Surgical History:  Procedure Laterality Date  . COLONOSCOPY  07-18-11    per Dr. Sharlett Iles, diverticulosis only, repeat in 10 yrs   . LUMBAR MICRODISCECTOMY  2002        Home Medications    Prior to Admission medications   Medication Sig Start Date End Date Taking? Authorizing Provider  ibuprofen (ADVIL,MOTRIN) 400 MG tablet Take 400 mg by mouth every 6 (six) hours as needed for mild pain.    Yes [provider]  methylPREDNISolone (MEDROL DOSEPAK) 4 MG TBPK tablet Take 4-24 mg by mouth See admin instructions. Take 6 tablets on day 1 then decrease by 1 tablet daily until all taken 05/13/18  Yes [provider]  tadalafil (CIALIS) 20 MG tablet Take 1 tablet (20 mg total) by mouth as needed for erectile dysfunction. Patient not taking: Reported on 05/16/2018 02/22/15 05/16/26  Laurey Morale, MD    Family History Family History  Problem Relation Age of Onset  . Alcohol abuse Father   . Lung cancer Father     Social History Social History   Tobacco Use  . Smoking status: Never Smoker  . Smokeless tobacco: Never Used  Substance Use Topics  . Alcohol use: Yes    Alcohol/week: 0.0 standard drinks    Comment: occ  . Drug use: No     Allergies   Penicillins   Review of Systems Review of Systems  All other systems reviewed and are negative.    Physical Exam Updated Vital Signs BP (!) 117/98   Pulse 82   Temp 98 F (36.7 C) (Oral)   Resp 13   Ht 6' (1.829 m)   Wt 108.9 kg   SpO2 95%   BMI 32.55 kg/m   Physical Exam Vitals signs and nursing note reviewed.  Constitutional:      Appearance: He is well-developed.  HENT:     Head: Normocephalic and atraumatic.  Eyes:     General: No scleral icterus.       Right eye: No discharge.        Left eye: No discharge.     Conjunctiva/sclera: Conjunctivae normal.     Pupils: Pupils are equal, round, and reactive to light.  Neck:     Musculoskeletal: Normal range of motion and neck supple.     Vascular: No JVD.  Cardiovascular:     Rate and Rhythm: Tachycardia present. Rhythm irregular.     Heart sounds: Normal heart sounds. No murmur. No friction rub. No gallop.   Pulmonary:     Effort: Pulmonary effort is normal. No respiratory distress.     Breath sounds: Normal breath sounds. No wheezing or rales.  Chest:     Chest wall: No tenderness.  Abdominal:     General: There is no distension.     Palpations:  Abdomen is soft. There is no mass.     Tenderness: There is no abdominal tenderness. There is no guarding or rebound.  Musculoskeletal: Normal range of motion.        General: No tenderness.  Skin:    General: Skin is warm and dry.  Neurological:     Mental Status: He is alert and oriented to person, place, and time.  Psychiatric:        Behavior: Behavior normal.        Thought Content: Thought content normal.        Judgment: Judgment normal.      ED Treatments / Results  Labs (all labs ordered are listed, but only abnormal results are displayed) Labs Reviewed  CBC  BASIC METABOLIC PANEL  TSH    EKG EKG Interpretation  Date/Time:  Friday May 16 2018 01:51:23 EST Ventricular Rate:  142 PR Interval:    QRS Duration: 98 QT Interval:  306 QTC Calculation: 471 R Axis:   47 Text Interpretation:  Atrial fibrillation RSR' in V1 or V2, right VCD or RVH No old tracing to compare Confirmed by Duffy Bruce [phone]) on 05/16/2018 2:18:13 AM   Radiology Dg Chest Port 1 View  Result Date: 05/16/2018 CLINICAL DATA:  Palpitations, chest pain EXAM: PORTABLE CHEST 1 VIEW COMPARISON:  None. FINDINGS: 10 mm nodular opacity in the right lower lung which may reflect a pulmonary nodule versus a nipple shadow. There is no focal parenchymal opacity. There is no pleural effusion or pneumothorax. The heart and mediastinal contours are unremarkable. The osseous structures are unremarkable. IMPRESSION: No active disease. 10 mm nodular opacity in the right lower lung which may reflect a pulmonary nodule versus a nipple shadow. Recommend repeat chest x-ray with nipple markers. Electronically Signed   By: Kathreen Devoid   On: 05/16/2018 03:12    Procedures .Cardioversion Date/Time: 05/16/2018 3:28 AM Performed by: Montine Circle, PA-C Authorized by: Montine Circle, PA-C   Consent:    Consent obtained:  Written   Consent given by:  Patient   Risks discussed:  Cutaneous burn, induced  arrhythmia and pain   Alternatives discussed:  Rate-control medication Pre-procedure details:    Cardioversion basis:  Emergent   Rhythm:  Atrial fibrillation   Electrode placement:  Anterior-posterior Patient sedated: Yes. Refer to sedation procedure documentation for details of sedation.  Attempt one:    Cardioversion mode:  Synchronous   Waveform:  Biphasic   Shock (joules) attempt one: 120.   Shock outcome:  Conversion to normal sinus rhythm  Post-procedure details:    Patient status:  Awake   Patient tolerance of procedure:  Tolerated well, no immediate complications   (including critical care time) CRITICAL CARE Performed by: Montine Circle Afib with RVR requiring electrical cardioversion.  Total critical care time: 40 minutes  Critical care time was exclusive of separately billable procedures and treating other patients.  Critical care was necessary to treat or prevent imminent or life-threatening deterioration.  Critical care was time spent personally by me on the following activities: development of treatment plan with patient and/or surrogate as well as nursing, discussions with consultants, evaluation of patient's response to treatment, examination of patient, obtaining history from patient or surrogate, ordering and performing treatments and interventions, ordering and review of laboratory studies, ordering and review of radiographic studies, pulse oximetry and re-evaluation of patient's condition.  Medications Ordered in ED Medications  propofol (DIPRIVAN) 10 mg/mL bolus/IV push 54.5 mg (has no administration in time range)  propofol (DIPRIVAN) 10 mg/mL bolus/IV push (30 mg Intravenous Given 05/16/18 0321)     Initial Impression / Assessment and Plan / ED Course  I have reviewed the triage vital signs and the nursing notes.  Pertinent labs & imaging results that were available during my care of the patient were reviewed by me and considered in my medical decision  making (see chart for details).     Patient with A. fib with RVR.  New onset.  Symptoms started at 12:45 AM.  No history of the same.  Not anticoagulated.  Likely induced from steroids which she started for back pain.  Patient is a candidate for cardioversion.  Patient seen by and discussed with Dr. Ellender Hose, who agrees with plan to cardiovert.  Cardioversion was successful in the emergency department.  CHA2DS2-VASc is 0.  Discussed case with on-call cardiologist, who recommends 12.5mg  lopressor for rate control, eliquis, and follow-up in the a-fib clinic.  Final Clinical Impressions(s) / ED Diagnoses   Final diagnoses:  Atrial fibrillation with RVR [facility])    ED Discharge Orders         Ordered    metoprolol tartrate (LOPRESSOR) 25 MG tablet  2 times daily     05/16/18 0407    apixaban (ELIQUIS) 5 MG TABS tablet  2 times daily     05/16/18 0407           Montine Circle, PA-C 05/16/18 0409    Duffy Bruce, MD 05/16/18 [phone]

## 2018-05-16 NOTE — Sedation Documentation (Signed)
Shock #1 delivered at 120J

## 2018-05-16 NOTE — Telephone Encounter (Signed)
2ND FYI for Dr. Sarajane Jews

## 2018-05-16 NOTE — Telephone Encounter (Signed)
I agree. Stay away from steroids if possible

## 2018-05-16 NOTE — ED Triage Notes (Signed)
Pt arrived via [facility]; pt from [facility] with c/o palpitations. Pt states that he started steroids today for bk pn and shortly after approx 15 mins he was having palpatations. No previous Hx of AFIb, EMS reports HR (160-180) Pt rec'd 10mg  Lopressor enroute. 140/78, CBG 113, 18, 98% on RA

## 2018-05-16 NOTE — Telephone Encounter (Signed)
LMOM for pt  To [facility] to sched a follow up from [facility] in ED.  Referral was made for afib clinic as well

## 2018-05-21 ENCOUNTER — Encounter (HOSPITAL_COMMUNITY): Payer: Self-pay

## 2018-05-27 ENCOUNTER — Encounter (HOSPITAL_COMMUNITY): Payer: Self-pay | Admitting: Physician Assistant

## 2018-05-27 ENCOUNTER — Ambulatory Visit (HOSPITAL_COMMUNITY)
Admission: RE | Admit: 2018-05-27 | Discharge: 2018-05-27 | Disposition: A | Discharge status: Home / Self Care | Payer: 59 | Source: Ambulatory Visit | Attending: Physician Assistant | Admitting: Physician Assistant

## 2018-05-27 VITALS — BP 138/76 | HR 65 | Ht 72.0 in | Wt 237.0 lb

## 2018-05-27 DIAGNOSIS — E669 Obesity, unspecified: Secondary | ICD-10-CM | POA: Insufficient documentation

## 2018-05-27 DIAGNOSIS — R0683 Snoring: Secondary | ICD-10-CM | POA: Diagnosis not present

## 2018-05-27 DIAGNOSIS — Z6832 Body mass index (BMI) 32.0-32.9, adult: Secondary | ICD-10-CM | POA: Diagnosis not present

## 2018-05-27 DIAGNOSIS — I480 Paroxysmal atrial fibrillation: Secondary | ICD-10-CM

## 2018-05-27 DIAGNOSIS — Z79899 Other long term (current) drug therapy: Secondary | ICD-10-CM | POA: Insufficient documentation

## 2018-05-27 DIAGNOSIS — Z7901 Long term (current) use of anticoagulants: Secondary | ICD-10-CM | POA: Diagnosis not present

## 2018-05-27 NOTE — Progress Notes (Signed)
Primary Care Physician: Laurey Morale, MD Referring Physician: [facility]   Michael Garner is a 63 y.o. male with a history of paroxysmal atrial fibrillation who presents for consultation in the [facility].  The patient was initially diagnosed with atrial fibrillation in the ER on 05/16/18 after presenting with symptoms of palpitations and heart racing. EMS transported him to the ER and he was found to be in Afib with RVR. Of note, he was recently prescribed prednisone for his chronic back issues prior to the onset of afib. He was cardioverted at the ER and discharged with Eliquis and metoprolol. He has not had any further episodes of heart racing. He has stopped drinking alcohol after his episode. He did have a nocturnal oximetry study at the [facility] and sleep study was recommended.   Today, he denies symptoms of palpitations, chest pain, shortness of breath, orthopnea, PND, lower extremity edema, dizziness, presyncope, syncope, bleeding, or neurologic sequela. The patient is tolerating medications without difficulties and is otherwise without complaint today.    Atrial Fibrillation Risk Factors:  he does have symptoms or diagnosis of sleep apnea. he does not have a history of rheumatic fever. he does have a history of alcohol use. The patient does not have a history of early familial atrial fibrillation or other arrhythmias.  he has a BMI of Body mass index is 32.14 kg/m.[name] Filed Weights   05/27/18 1401  Weight: 107.5 kg    Family History  Problem Relation Age of Onset  . Alcohol abuse Father   . Lung cancer Father      Atrial Fibrillation Management history:  Previous antiarrhythmic drugs: none Previous cardioversions: 05/16/18 Previous ablations: none CHADS2VASC score: 0 Anticoagulation history: Eliquis   Past Medical History:  Diagnosis Date  . BPH with urinary obstruction   . Erectile dysfunction   . Hepatitis B infection    resolved     Past Surgical History:  Procedure Laterality Date  . COLONOSCOPY  07-18-11    per Dr. Sharlett Iles, diverticulosis only, repeat in 10 yrs   . LUMBAR MICRODISCECTOMY  2002    Current Outpatient Medications  Medication Sig Dispense Refill  . acetaminophen (TYLENOL) 500 MG tablet Take 500 mg by mouth every 6 (six) hours as needed.    [name] apixaban (ELIQUIS) 5 MG TABS tablet Take 1 tablet (5 mg total) by mouth 2 (two) times daily for 30 days. 60 tablet 0  . metoprolol tartrate (LOPRESSOR) 25 MG tablet Take 0.5 tablets (12.5 mg total) by mouth 2 (two) times daily. 30 tablet 0  . tadalafil (CIALIS) 20 MG tablet Take 1 tablet (20 mg total) by mouth as needed for erectile dysfunction. 10 tablet 11  . ibuprofen (ADVIL,MOTRIN) 400 MG tablet Take 400 mg by mouth every 6 (six) hours as needed for mild pain.      No current facility-administered medications for this encounter.     Allergies  Allergen Reactions  . Penicillins     Not sure but immediate family members are allergic    Social History   Socioeconomic History  . Marital status: Divorced    Spouse name: Not on file  . Number of children: Not on file  . Years of education: Not on file  . Highest education level: Not on file  Occupational History  . Not on file  Social Needs  . Financial resource strain: Not on file  . Food insecurity:    Worry: Not on file  Inability: Not on file  . Transportation needs:    Medical: Not on file    Non-medical: Not on file  Tobacco Use  . Smoking status: Never Smoker  . Smokeless tobacco: Never Used  Substance and Sexual Activity  . Alcohol use: Yes    Alcohol/week: 0.0 standard drinks    Comment: occ  . Drug use: No  . Sexual activity: Not on file  Lifestyle  . Physical activity:    Days per week: Not on file    Minutes per session: Not on file  . Stress: Not on file  Relationships  . Social connections:    Talks on phone: Not on file    Gets together: Not on file    Attends  religious service: Not on file    Active member of club or organization: Not on file    Attends meetings of clubs or organizations: Not on file    Relationship status: Not on file  . Intimate partner violence:    Fear of current or ex partner: Not on file    Emotionally abused: Not on file    Physically abused: Not on file    Forced sexual activity: Not on file  Other Topics Concern  . Not on file  Social History Narrative  . Not on file     ROS- All systems are reviewed and negative except as per the HPI above.  Physical Exam: Vitals:   05/27/18 1401  BP: 138/76  Pulse: 65  Weight: 107.5 kg  Height: 6' (1.829 m)    GEN- The patient is well appearing, alert and oriented x 3 today.   Head- normocephalic, atraumatic Eyes-  Sclera clear, conjunctiva pink Ears- hearing intact Oropharynx- clear Neck- supple  Lungs- Clear to ausculation bilaterally, normal work of breathing Heart- Regular rate and rhythm, no murmurs, rubs or gallops  GI- soft, NT, ND, + BS Extremities- no clubbing, cyanosis, or edema MS- no significant deformity or atrophy Skin- no rash or lesion Psych- euthymic mood, full affect Neuro- strength and sensation are intact  Wt Readings from Last 3 Encounters:  05/27/18 107.5 kg  05/16/18 108.9 kg  09/25/17 107.7 kg    EKG today demonstrates SR HR 65, PR 142, QRS 86, QTc 407  Epic records are reviewed at length today  Assessment and Plan:  1. Paroxysmal atrial fibrillation The patient has paroxysmal atrial fibrillation in the setting of steroid use. Currently on Eliquis and metoprolol. TSH at ER normal. Possibly secondary to steroid use. He has not had any further episodes. Continue Eliquis for 4 weeks post DCCV and then may discontinue for low CHADS2VASC score. May also stop BB if no further heart racing. Encouraged patient to keep medicine in case afib returns.  This patients CHA2DS2-VASc Score and unadjusted Ischemic Stroke Rate (% per year) is  equal to 0.2 % stroke rate/year from a score of 0  Above score calculated as 1 point each if present [CHF, HTN, DM, Vascular=MI/PAD/Aortic Plaque, Age if 65-74, or Male] Above score calculated as 2 points each if present [Age > 75, or Stroke/TIA/TE]   2. Obesity Body mass index is 32.14 kg/m. Lifestyle modification was discussed at length including regular exercise and weight reduction. Difficult given back issues.  3. Snoring Patient to have follow up sleep study at [facility].   Follow up in Afib clinic as needed.   [facility] [address] [place], [place] [address] [phone] 05/27/2018 3:00 PM

## 2018-06-10 ENCOUNTER — Telehealth (HOSPITAL_COMMUNITY): Payer: Self-pay | Admitting: *Deleted

## 2018-06-10 DIAGNOSIS — I480 Paroxysmal atrial fibrillation: Secondary | ICD-10-CM

## 2018-06-10 NOTE — Telephone Encounter (Signed)
Pt cld back and is agreeable to scheduling echo and follow up for the first week of June 2020.

## 2018-06-10 NOTE — Telephone Encounter (Signed)
LMOM for pt to rtn call to schedule echo and follow up with Adline Peals, PA in 3 months.

## 2018-07-05 ENCOUNTER — Encounter: Payer: Self-pay | Admitting: Family Medicine

## 2018-07-07 ENCOUNTER — Ambulatory Visit (INDEPENDENT_AMBULATORY_CARE_PROVIDER_SITE_OTHER): Payer: 59 | Admitting: Family Medicine

## 2018-07-07 ENCOUNTER — Other Ambulatory Visit: Payer: Self-pay

## 2018-07-07 ENCOUNTER — Encounter: Payer: Self-pay | Admitting: Family Medicine

## 2018-07-07 DIAGNOSIS — G8929 Other chronic pain: Secondary | ICD-10-CM

## 2018-07-07 DIAGNOSIS — M545 Low back pain, unspecified: Secondary | ICD-10-CM

## 2018-07-07 DIAGNOSIS — M25511 Pain in right shoulder: Secondary | ICD-10-CM | POA: Diagnosis not present

## 2018-07-07 DIAGNOSIS — I480 Paroxysmal atrial fibrillation: Secondary | ICD-10-CM | POA: Diagnosis not present

## 2018-07-07 MED ORDER — MELOXICAM 15 MG PO TABS: 15.0000 mg | ORAL_TABLET | Freq: Every day | ORAL | 1 refills | Status: DC

## 2018-07-07 NOTE — Progress Notes (Signed)
Subjective:    Patient ID: Michael Garner, male    DOB: 04/04/1956, 63 y.o.   MRN: [id number]  HPI Virtual Visit via Video Note  I connected with the patient on 07/07/18 at  1:30 PM EDT by a video enabled telemedicine application and verified that I am speaking with the correct person using two identifiers.  Location patient: home Location provider:work or home office Persons participating in the virtual visit: patient, provider  I discussed the limitations of evaluation and management by telemedicine and the availability of in person appointments. The patient expressed understanding and agreed to proceed.   HPI: This is to discuss shoulder pain and arthritis and to follow up an ER visit on 05-16-18. He has had pain in the right shoulder for several years, and last year he saw [facility] and had a cortisone injection. This helped for a few months but this pain is back. He also has chronic low back pain. He was scheduled to have spinal surgery at the [facility] in [place] last month, but this was postponed due to the coronavirus pandemic. He has tried Ibuprofen and Diclofenac with mixed results. His neurosurgeon had started him no some Prednisone last month but this triggered an episode of atrial fibrillation. He felt heis heart racing and was SOB on 05-16-18 and EMS took him to [facility] ER. We was diagnosed with atril fib with a RVR and he was successfully electrically converted. He was then placed on a 30 day regimen of Eliquis and Metoprolol. He has done well since with sustained sinus rhythm, and these meds were stopped.    ROS: See pertinent positives and negatives per HPI.  Past Medical History:  Diagnosis Date  . BPH with urinary obstruction   . Erectile dysfunction   . Hepatitis B infection    resolved     Past Surgical History:  Procedure Laterality Date  . COLONOSCOPY  07-18-11    per Dr. Sharlett Iles, diverticulosis only, repeat in 10 yrs   . LUMBAR MICRODISCECTOMY   2002    Family History  Problem Relation Age of Onset  . Alcohol abuse Father   . Lung cancer Father     SOCIAL HX:    Current Outpatient Medications:  .  acetaminophen (TYLENOL) 500 MG tablet, Take 500 mg by mouth every 6 (six) hours as needed., Disp: , Rfl:  .  apixaban (ELIQUIS) 5 MG TABS tablet, Take 1 tablet (5 mg total) by mouth 2 (two) times daily for 30 days., Disp: 60 tablet, Rfl: 0 .  ibuprofen (ADVIL,MOTRIN) 400 MG tablet, Take 400 mg by mouth every 6 (six) hours as needed for mild pain. , Disp: , Rfl:  .  metoprolol tartrate (LOPRESSOR) 25 MG tablet, Take 0.5 tablets (12.5 mg total) by mouth 2 (two) times daily., Disp: 30 tablet, Rfl: 0 .  tadalafil (CIALIS) 20 MG tablet, Take 1 tablet (20 mg total) by mouth as needed for erectile dysfunction., Disp: 10 tablet, Rfl: 11  EXAM:  VITALS per patient if applicable:  GENERAL: alert, oriented, appears well and in no acute distress  HEENT: atraumatic, conjunttiva clear, no obvious abnormalities on inspection of external nose and ears  NECK: normal movements of the head and neck  LUNGS: on inspection no signs of respiratory distress, breathing rate appears normal, no obvious gross SOB, gasping or wheezing  CV: no obvious cyanosis  MS: moves all visible extremities without noticeable abnormality  PSYCH/NEURO: pleasant and cooperative, no obvious depression or anxiety, speech and  thought processing grossly intact  ASSESSMENT AND PLAN: He had a bout of atrial fibrillation that has resolved. This was likely triggered by taking Prednisone, so we will avoid treating him with this. As for the arthritis and back pain, he will try Meloxicam 15 mg daily. Hopefully when the coronavirus is contained, he can have his back surgery rescheduled.  Alysia Penna, MD  Discussed the following assessment and plan:  No diagnosis found.     I discussed the assessment and treatment plan with the patient. The patient was provided an  opportunity to ask questions and all were answered. The patient agreed with the plan and demonstrated an understanding of the instructions.   The patient was advised to call back or seek an in-person evaluation if the symptoms worsen or if the condition fails to improve as anticipated.  I provided 19 minutes of non-face-to-face time during this encounter.    Review of Systems     Objective:   Physical Exam        Assessment & Plan:

## 2018-07-07 NOTE — Telephone Encounter (Signed)
webex appointment scheduled for today at 1:30

## 2018-08-16 MED ORDER — NALOXONE HCL 0.4 MG/ML IJ SOLN
.20 | INTRAMUSCULAR | Status: DC
Start: ? — End: 2018-08-16

## 2018-08-16 MED ORDER — HYDROMORPHONE HCL 1 MG/ML IJ SOLN
.50 | INTRAMUSCULAR | Status: DC
Start: ? — End: 2018-08-16

## 2018-08-16 MED ORDER — POLYETHYLENE GLYCOL 3350 17 G PO PACK
1.00 | PACK | ORAL | Status: DC
Start: ? — End: 2018-08-16

## 2018-08-16 MED ORDER — ACETAMINOPHEN 500 MG PO TABS
1000.00 | ORAL_TABLET | ORAL | Status: DC
Start: 2018-08-15 — End: 2018-08-16

## 2018-08-16 MED ORDER — CELLULOSE SODIUM PHOSPHATE VI
5000.00 | Status: DC
Start: 2018-08-15 — End: 2018-08-16

## 2018-08-16 MED ORDER — DICLOXACILLIN SODIUM 62.5 MG/5ML PO SUSR
10.00 | ORAL | Status: DC
Start: ? — End: 2018-08-16

## 2018-08-16 MED ORDER — MAGNESIUM HYDROXIDE 400 MG/5ML PO SUSP
30.00 | ORAL | Status: DC
Start: ? — End: 2018-08-16

## 2018-08-16 MED ORDER — BARO-CAT PO
75.00 | ORAL | Status: DC
Start: ? — End: 2018-08-16

## 2018-08-16 MED ORDER — GENERIC EXTERNAL MEDICATION
Status: DC
Start: ? — End: 2018-08-16

## 2018-08-16 MED ORDER — Medication
1.00 | Status: DC
Start: 2018-08-15 — End: 2018-08-16

## 2018-08-16 MED ORDER — BENZOCAINE-MENTHOL 15-3.6 MG MT LOZG
1.00 | LOZENGE | OROMUCOSAL | Status: DC
Start: ? — End: 2018-08-16

## 2018-08-16 MED ORDER — METHOCARBAMOL 500 MG PO TABS
1000.00 | ORAL_TABLET | ORAL | Status: DC
Start: ? — End: 2018-08-16

## 2018-09-04 ENCOUNTER — Telehealth (HOSPITAL_COMMUNITY): Payer: Self-pay | Admitting: *Deleted

## 2018-09-04 NOTE — Telephone Encounter (Signed)
Lft vcml x 2 for pt to clbk re in office visit scheduled for September 09, 2018.  Pt needs to be rescheduled as virtual visit or moved to in clinic day.

## 2018-09-08 ENCOUNTER — Other Ambulatory Visit (HOSPITAL_COMMUNITY): Payer: 59

## 2018-09-08 ENCOUNTER — Ambulatory Visit (HOSPITAL_COMMUNITY): Payer: 59 | Admitting: Physician Assistant

## 2018-09-09 ENCOUNTER — Ambulatory Visit (HOSPITAL_COMMUNITY): Payer: 59 | Admitting: Physician Assistant

## 2018-09-09 ENCOUNTER — Ambulatory Visit (HOSPITAL_COMMUNITY): Payer: 59

## 2018-09-12 ENCOUNTER — Other Ambulatory Visit (HOSPITAL_COMMUNITY): Payer: Self-pay | Admitting: *Deleted

## 2018-09-12 ENCOUNTER — Other Ambulatory Visit: Payer: Self-pay

## 2018-09-12 ENCOUNTER — Ambulatory Visit (HOSPITAL_COMMUNITY)
Admission: RE | Admit: 2018-09-12 | Discharge: 2018-09-12 | Disposition: A | Discharge status: Home / Self Care | Payer: 59 | Source: Ambulatory Visit | Attending: Physician Assistant | Admitting: Physician Assistant

## 2018-09-12 ENCOUNTER — Encounter (HOSPITAL_COMMUNITY): Payer: Self-pay | Admitting: Physician Assistant

## 2018-09-12 DIAGNOSIS — I480 Paroxysmal atrial fibrillation: Secondary | ICD-10-CM

## 2018-09-12 NOTE — Progress Notes (Signed)
Electrophysiology TeleHealth Note   Due to national recommendations of social distancing due to [place] 19, Audio/video telehealth visit is felt to be most appropriate for this patient at this time.  See consent below from today for patient consent regarding telehealth for the Atrial Fibrillation Clinic. Consent obtained verbally.   Date:  09/12/2018   ID:  Michael Garner, DOB [birth year], MRN [id number]  Location: home  Provider location: [address] [place], [place] [address] Evaluation Performed: Follow up  PCP:  Laurey Morale, MD   CC: Follow up for atrial fibrillation   History of Present Illness: Michael Garner is a 63 y.o. male who presents via audio/video conferencing for a telehealth visit today.  The patient was initially diagnosed with atrial fibrillation in the ER on 05/16/18 after presenting with symptoms of palpitations and heart racing. EMS transported him to the ER and he was found to be in Afib with RVR. Of note, he was recently prescribed prednisone for his chronic back issues prior to the onset of afib. He was cardioverted at the ER and discharged with Eliquis and metoprolol. He has not had any further episodes of heart racing. He has stopped drinking alcohol after his episode. He did have a nocturnal oximetry study at the [facility] and sleep study was recommended.   Patient reports that he has done very well since his last visit. He had back surgery recently and is recovering well. No symptoms of heart racing or palpitations. He is no longer on anticoagulation or BB.  Today, he denies symptoms of palpitations, chest pain, shortness of breath, orthopnea, PND, lower extremity edema, claudication, dizziness, presyncope, syncope, bleeding, or neurologic sequela. The patient is tolerating medications without difficulties and is otherwise without complaint today.   he denies symptoms of cough, fevers, chills, or new SOB worrisome for COVID 19.     Atrial Fibrillation Risk  Factors:  he does have symptoms or diagnosis of sleep apnea. Will refer to sleep medicine. he does not have a history of rheumatic fever. he does have a history of alcohol use. The patient does not have a history of early familial atrial fibrillation or other arrhythmias.  he has a BMI of There is no height or weight on file to calculate BMI.. There were no vitals filed for this visit.  Past Medical History:  Diagnosis Date  . BPH with urinary obstruction   . Erectile dysfunction   . Hepatitis B infection    resolved    Past Surgical History:  Procedure Laterality Date  . CARDIOVERSION  05/16/2018  . COLONOSCOPY  07-18-11    per Dr. Sharlett Iles, diverticulosis only, repeat in 10 yrs   . LUMBAR MICRODISCECTOMY  2002     Current Outpatient Medications  Medication Sig Dispense Refill  . acetaminophen (TYLENOL) 500 MG tablet Take 500 mg by mouth every 6 (six) hours as needed.    [name] ibuprofen (ADVIL,MOTRIN) 400 MG tablet Take 400 mg by mouth every 6 (six) hours as needed for mild pain.     . meloxicam (MOBIC) 15 MG tablet Take 1 tablet (15 mg total) by mouth daily. 90 tablet 1  . tadalafil (CIALIS) 20 MG tablet Take 1 tablet (20 mg total) by mouth as needed for erectile dysfunction. 10 tablet 11   No current facility-administered medications for this encounter.     Allergies:   Penicillins   Social History:  The patient  reports that he has never smoked. He has never used smokeless  tobacco. He reports current alcohol use. He reports that he does not use drugs.   Family History:  The patient's  family history includes Alcohol abuse in his father; Lung cancer in his father.    ROS:  Please see the history of present illness.   All other systems are personally reviewed and negative.   Exam: Well appearing, alert and conversant, regular work of breathing,  good skin color  Recent Labs: 09/25/2017: ALT 20 05/16/2018: BUN 14; Creatinine, Ser 0.72; Hemoglobin 15.6; Platelets 196;  Potassium 3.9; Sodium 139; TSH 3.347  personally reviewed    Other studies personally reviewed: Additional studies/ records that were reviewed today include: Epic notes    ASSESSMENT AND PLAN:  1.  Paroxysmal atrial fibrillation Likely related to steroid use. S/p DCCV 05/16/18. Patient has had no further episodes. Echocardiogram pending. No anticoagulation indicated at this time.  This patients CHA2DS2-VASc Score and unadjusted Ischemic Stroke Rate (% per year) is equal to 0.2 % stroke rate/year from a score of 0  Above score calculated as 1 point each if present [CHF, HTN, DM, Vascular=MI/PAD/Aortic Plaque, Age if 65-74, or Male] Above score calculated as 2 points each if present [Age > 75, or Stroke/TIA/TE]  2. Obesity Lifestyle modification was discussed and encouraged including regular physical activity and weight reduction.  3. Snoring Patient has symptoms of severe snoring. Had nocturnal oximetry study at [facility] and sleep study was recommended. Will refer to sleep medicine.    COVID screen The patient does not have any symptoms that suggest any further testing/ screening at this time.  Social distancing reinforced today.    Follow-up with AF clinic in 6 months.  Current medicines are reviewed at length with the patient today.   The patient does not have concerns regarding his medicines.  The following changes were made today:  none  Labs/ tests ordered today include:  No orders of the defined types were placed in this encounter.   Patient Risk:  after full review of this patients clinical status, I feel that they are at moderate risk at this time.   Today, I have spent 8 minutes with the patient with telehealth technology discussing atrial fibrillation, lifestyle, and OSA.    Gwenlyn Perking PA-C 09/12/2018 11:52 AM  Afib [facility] [address] [place],  [address] [phone]   I hereby voluntarily request, consent and  authorize the [facility] and its employed or contracted physicians, physician assistants, nurse practitioners or other licensed health care professionals (the Practitioner), to provide me with telemedicine health care services (the "Services") as deemed necessary by the treating Practitioner. I acknowledge and consent to receive the Services by the Practitioner via telemedicine. I understand that the telemedicine visit will involve communicating with the Practitioner through live audiovisual communication technology and the disclosure of certain medical information by electronic transmission. I acknowledge that I have been given the opportunity to request an in-person assessment or other available alternative prior to the telemedicine visit and am voluntarily participating in the telemedicine visit.   I understand that I have the right to withhold or withdraw my consent to the use of telemedicine in the course of my care at any time, without affecting my right to future care or treatment, and that the Practitioner or I may terminate the telemedicine visit at any time. I understand that I have the right to inspect all information obtained and/or recorded in the course of the telemedicine visit and may receive copies  of available information for a reasonable fee.  I understand that some of the potential risks of receiving the Services via telemedicine include:   Delay or interruption in medical evaluation due to technological equipment failure or disruption;  Information transmitted may not be sufficient (e.g. poor resolution of images) to allow for appropriate medical decision making by the Practitioner; and/or  In rare instances, security protocols could fail, causing a breach of personal health information.   Furthermore, I acknowledge that it is my responsibility to provide information about my medical history, conditions and care that is complete and accurate to the best of my ability. I  acknowledge that Practitioner's advice, recommendations, and/or decision may be based on factors not within their control, such as incomplete or inaccurate data provided by me or distortions of diagnostic images or specimens that may result from electronic transmissions. I understand that the practice of medicine is not an exact science and that Practitioner makes no warranties or guarantees regarding treatment outcomes. I acknowledge that I will receive a copy of this consent concurrently upon execution via email to the email address I last provided but may also request a printed copy by calling the office of the [facility].  I understand that my insurance will be billed for this visit.   I have read or had this consent read to me.  I understand the contents of this consent, which adequately explains the benefits and risks of the Services being provided via telemedicine.  I have been provided ample opportunity to ask questions regarding this consent and the Services and have had my questions answered to my satisfaction.  I give my informed consent for the services to be provided through the use of telemedicine in my medical care  By participating in this telemedicine visit I agree to the above.

## 2018-09-19 ENCOUNTER — Telehealth: Payer: Self-pay | Admitting: *Deleted

## 2018-09-19 NOTE — Telephone Encounter (Signed)
-----   Message from Juluis Mire, RN sent at 09/12/2018 12:07 PM EDT ----- Regarding: sleep study Pt needs sleep study for snoring, sleep oximetry study at [facility] and sleep study was recommended per clint fenton, pa Thanks Stacy

## 2018-09-19 NOTE — Telephone Encounter (Signed)
Sleep study PA submitted via web portal to [facility].

## 2018-09-19 NOTE — Telephone Encounter (Signed)
-----   Message from Juluis Mire, RN sent at 09/12/2018 12:07 PM EDT ----- Regarding: sleep study Pt needs sleep study for snoring, sleep oximetry study at [facility] and sleep study was recommended per clint fenton, pa Thanks Stacy

## 2018-09-19 NOTE — Telephone Encounter (Signed)
[facility] pending tracking for sleep study request. [id number].

## 2018-09-29 ENCOUNTER — Encounter: Payer: Self-pay | Admitting: Family Medicine

## 2018-09-29 DIAGNOSIS — G4730 Sleep apnea, unspecified: Secondary | ICD-10-CM

## 2018-09-29 NOTE — Telephone Encounter (Signed)
He likely has sleep apnea. I have referred him to Pulmonary to arrange for a full sleep study. Tell him to bring this report with him to that appt

## 2018-09-29 NOTE — Telephone Encounter (Signed)
Dr. Fry please advise. Thanks  

## 2018-10-02 ENCOUNTER — Encounter (HOSPITAL_COMMUNITY): Payer: Self-pay | Admitting: *Deleted

## 2018-10-02 ENCOUNTER — Ambulatory Visit (HOSPITAL_COMMUNITY)
Admission: RE | Admit: 2018-10-02 | Discharge: 2018-10-02 | Disposition: A | Discharge status: Home / Self Care | Payer: 59 | Source: Ambulatory Visit | Attending: Physician Assistant | Admitting: Physician Assistant

## 2018-10-02 ENCOUNTER — Other Ambulatory Visit: Payer: Self-pay

## 2018-10-02 DIAGNOSIS — G4730 Sleep apnea, unspecified: Secondary | ICD-10-CM | POA: Insufficient documentation

## 2018-10-02 DIAGNOSIS — I480 Paroxysmal atrial fibrillation: Secondary | ICD-10-CM | POA: Diagnosis not present

## 2018-10-02 DIAGNOSIS — I358 Other nonrheumatic aortic valve disorders: Secondary | ICD-10-CM | POA: Diagnosis not present

## 2018-10-07 ENCOUNTER — Telehealth: Payer: Self-pay | Admitting: *Deleted

## 2018-10-07 NOTE — Telephone Encounter (Signed)
Staff message sent to [facility] denied in lab sleep study. Ok to order HST. No PA is required.

## 2018-10-07 NOTE — Telephone Encounter (Signed)
-----   Message from Juluis Mire, RN sent at 09/12/2018 12:07 PM EDT ----- Regarding: sleep study Pt needs sleep study for snoring, sleep oximetry study at [facility] and sleep study was recommended per clint fenton, pa Thanks Stacy

## 2018-10-08 ENCOUNTER — Encounter: Payer: Self-pay | Admitting: Family Medicine

## 2018-10-08 ENCOUNTER — Telehealth: Payer: Self-pay | Admitting: *Deleted

## 2018-10-08 DIAGNOSIS — R0683 Snoring: Secondary | ICD-10-CM

## 2018-10-08 DIAGNOSIS — G4700 Insomnia, unspecified: Secondary | ICD-10-CM

## 2018-10-08 NOTE — Telephone Encounter (Signed)
Dr. Sarajane Jews please advise on urology office for the pt to get an appointment.

## 2018-10-08 NOTE — Telephone Encounter (Signed)
-----   Message from Lauralee Evener, [place] sent at 10/08/2018  7:55 AM EDT ----- Regarding: RE: sleep study  Ok to schedule HST. Place order. ----- Message ----- From: Juluis Mire, RN Sent: 10/07/2018   4:06 PM EDT To: Lauralee Evener, CMA Subject: RE: sleep study                                Ok to schedule. ----- Message ----- From: Lauralee Evener, CMA Sent: 10/07/2018   3:44 PM EDT To: Juluis Mire, RN Subject: RE: sleep study                                [employer] denied in lab sleep study. Ok to order HST. No PA is required. If HST ok call [phone] to schedule. ----- Message ----- From: Juluis Mire, RN Sent: 09/12/2018  12:07 PM EDT To: [facility] Sleep Studies Subject: sleep study                                    Pt needs sleep study for snoring, sleep oximetry study at [facility] and sleep study was recommended per clint fenton, pa Thanks Stacy

## 2018-10-08 NOTE — Telephone Encounter (Signed)
Informed patient of upcoming home sleep study through [facility]. Patient is scheduled for 11/26/18 at 11 am to pick up home sleep kit and meet with Respiratory therapist at [facility]. Patient is aware that if this appointment date and time does not work for them they should contact Artis Delay directly at [phone]. Patient is aware that a sleep packet will be sent from [facility] in week. Left detailed message on voicemail with date and time of titration and informed patient to call back to confirm or reschedule.

## 2018-10-08 NOTE — Telephone Encounter (Signed)
-----   Message from Lauralee Evener, [place] sent at 10/08/2018  7:55 AM EDT ----- Regarding: RE: sleep study  Ok to schedule HST. Place order. ----- Message ----- From: Juluis Mire, RN Sent: 10/07/2018   4:06 PM EDT To: Lauralee Evener, CMA Subject: RE: sleep study                                Ok to schedule. ----- Message ----- From: Lauralee Evener, CMA Sent: 10/07/2018   3:44 PM EDT To: Juluis Mire, RN Subject: RE: sleep study                                [employer] denied in lab sleep study. Ok to order HST. No PA is required. If HST ok call [phone] to schedule. ----- Message ----- From: Juluis Mire, RN Sent: 09/12/2018  12:07 PM EDT To: [facility] Sleep Studies Subject: sleep study                                    Pt needs sleep study for snoring, sleep oximetry study at [facility] and sleep study was recommended per clint fenton, pa Thanks Stacy

## 2018-10-09 NOTE — Telephone Encounter (Signed)
We can discuss these things at his Doxy visit

## 2018-10-09 NOTE — Telephone Encounter (Signed)
Make him a Doxy visit so we can discuss this

## 2018-10-09 NOTE — Telephone Encounter (Signed)
Dr. Sarajane Jews please advise on a note per the pts request. Thanks

## 2018-10-21 ENCOUNTER — Encounter: Payer: Self-pay | Admitting: Family Medicine

## 2018-10-28 NOTE — Telephone Encounter (Signed)
noted 

## 2018-11-26 ENCOUNTER — Other Ambulatory Visit: Payer: Self-pay

## 2018-11-26 ENCOUNTER — Ambulatory Visit (HOSPITAL_BASED_OUTPATIENT_CLINIC_OR_DEPARTMENT_OTHER): Payer: 59 | Attending: Physician Assistant | Admitting: Cardiology

## 2018-11-26 DIAGNOSIS — G4733 Obstructive sleep apnea (adult) (pediatric): Secondary | ICD-10-CM

## 2018-11-26 DIAGNOSIS — G4700 Insomnia, unspecified: Secondary | ICD-10-CM | POA: Diagnosis not present

## 2018-11-26 DIAGNOSIS — R0683 Snoring: Secondary | ICD-10-CM

## 2018-12-01 NOTE — Procedures (Signed)
    Patient Name: Michael Garner, Michael Garner Date: 11/27/2018 Gender: Male D.O.B: 1955/07/20 Age (years): 43 Referring Provider: Malka So PA Height (inches): 70 Interpreting Physician: Fransico Him MD, ABSM Weight (lbs): 240 RPSGT: Neeriemer, Holly BMI: 34 MRN: [id number] Neck Size: 16.50  CLINICAL INFORMATION Sleep Study Type: HST  Indication for sleep study: Insomnia (780.52), Snoring (786.09)  Epworth Sleepiness Score: 9  SLEEP STUDY TECHNIQUE A multi-channel overnight portable sleep study was performed. The channels recorded were: nasal airflow, thoracic respiratory movement, and oxygen saturation with a pulse oximetry. Snoring was also monitored.  MEDICATIONS Patient self administered medications include: N/A.  SLEEP ARCHITECTURE Patient was studied for 378.8 minutes. The sleep efficiency was 100.0 % and the patient was supine for 66.1%. The arousal index was 0.0 per hour.  RESPIRATORY PARAMETERS The overall AHI was 44.8 per hour, with a central apnea index of 0.0 per hour.  The oxygen nadir was 83% during sleep.  CARDIAC DATA Mean heart rate during sleep was 64.0 bpm.  IMPRESSIONS - Severe obstructive sleep apnea occurred during this study (AHI = 44.8/h). - No significant central sleep apnea occurred during this study (CAI = 0.0/h). - Moderate oxygen desaturation was noted during this study (Min O2 = 83%). - Patient snored 51.5% during the sleep.  DIAGNOSIS - Obstructive Sleep Apnea (327.23 [G47.33 ICD-10])  RECOMMENDATIONS - Recommend in lab CPAP titration. - Positional therapy avoiding supine position during sleep. - Avoid alcohol, sedatives and other CNS depressants that may worsen sleep apnea and disrupt normal sleep architecture. - Sleep hygiene should be reviewed to assess factors that may improve sleep quality. - Weight management and regular exercise should be initiated or continued.  [Electronically signed] 12/01/2018 03:17 PM  Fransico Him MD, ABSM  Diplomate, American Board of Sleep Medicine

## 2018-12-03 ENCOUNTER — Telehealth: Payer: Self-pay | Admitting: *Deleted

## 2018-12-03 NOTE — Telephone Encounter (Signed)
Informed patient of sleep study results and patient understanding was verbalized. Patient understands his sleep study showed they have sleep apnea and recommend CPAP titration. Please set up titration in the sleep lab.  Pt is aware and agreeable to his results.  Titration sent to sleep pool   

## 2018-12-03 NOTE — Telephone Encounter (Signed)
-----   Message from Sueanne Margarita, MD sent at 12/01/2018  3:20 PM EDT ----- Please let patient know that they have sleep apnea and recommend CPAP titration. Please set up titration in the sleep lab.

## 2018-12-23 ENCOUNTER — Telehealth: Payer: Self-pay | Admitting: *Deleted

## 2018-12-23 NOTE — Telephone Encounter (Signed)
Faxed CPAP titration PA to [facility].[name] web portal down.

## 2018-12-31 ENCOUNTER — Telehealth: Payer: Self-pay | Admitting: *Deleted

## 2018-12-31 DIAGNOSIS — G4700 Insomnia, unspecified: Secondary | ICD-10-CM

## 2018-12-31 NOTE — Telephone Encounter (Signed)
Staff message sent to [facility] titration study denied by [facility]. Will have to do APAP. Notify ordering provider.

## 2018-12-31 NOTE — Telephone Encounter (Signed)
-----   Message from Lauralee Evener, [place] sent at 12/31/2018 10:14 AM EDT ----- Regarding: RE: Precert [employer] denied CPAP titration. APAP is recommended. Please notify provider. ----- Message ----- From: Freada Bergeron, CMA Sent: 12/01/2018   5:16 PM EDT To: [facility] Sleep Studies Subject: Precert                                        recommend CPAP titration

## 2019-01-01 NOTE — Telephone Encounter (Signed)
Patient is scheduled for CPAP Titration on 01/13/19. [place] is scheduled for COVID screening on 01/10/19 prior to titration. Patient understands his titration study will be done at [facility] sleep lab. Patient understands he will receive a letter in a week or so detailing appointment, date, time, and location. Call [phone] or [phone]. Patient understands to call if he does not receive the letter  in a timely manner. Patient agrees with treatment and thanked me for call.

## 2019-01-01 NOTE — Telephone Encounter (Signed)
RE: Cranston Neighbor, CMA  Freada Bergeron, CMA        [employer] denied CPAP titration. APAP is recommended. Please notify provider

## 2019-01-06 ENCOUNTER — Ambulatory Visit (HOSPITAL_BASED_OUTPATIENT_CLINIC_OR_DEPARTMENT_OTHER): Payer: 59 | Admitting: Cardiology

## 2019-01-10 ENCOUNTER — Other Ambulatory Visit (HOSPITAL_COMMUNITY): Payer: 59

## 2019-01-13 ENCOUNTER — Encounter (HOSPITAL_BASED_OUTPATIENT_CLINIC_OR_DEPARTMENT_OTHER): Payer: 59 | Admitting: Cardiology

## 2019-01-14 ENCOUNTER — Encounter: Payer: Self-pay | Admitting: Family Medicine

## 2019-01-14 ENCOUNTER — Inpatient Hospital Stay (HOSPITAL_COMMUNITY): Admission: RE | Admit: 2019-01-14 | Payer: 59 | Source: Ambulatory Visit

## 2019-01-16 NOTE — Telephone Encounter (Signed)
Make an in person OV with me to assess this

## 2019-01-17 ENCOUNTER — Ambulatory Visit (HOSPITAL_BASED_OUTPATIENT_CLINIC_OR_DEPARTMENT_OTHER): Payer: 59 | Attending: Cardiology | Admitting: Cardiology

## 2019-01-19 ENCOUNTER — Encounter: Payer: Self-pay | Admitting: Family Medicine

## 2019-01-19 ENCOUNTER — Other Ambulatory Visit: Payer: Self-pay

## 2019-01-19 ENCOUNTER — Ambulatory Visit (INDEPENDENT_AMBULATORY_CARE_PROVIDER_SITE_OTHER): Payer: 59 | Admitting: Family Medicine

## 2019-01-19 VITALS — BP 140/90 | Temp 98.2°F | Wt 244.0 lb

## 2019-01-19 DIAGNOSIS — M19041 Primary osteoarthritis, right hand: Secondary | ICD-10-CM

## 2019-01-19 DIAGNOSIS — M6208 Separation of muscle (nontraumatic), other site: Secondary | ICD-10-CM

## 2019-01-19 DIAGNOSIS — M19042 Primary osteoarthritis, left hand: Secondary | ICD-10-CM

## 2019-01-19 DIAGNOSIS — M1990 Unspecified osteoarthritis, unspecified site: Secondary | ICD-10-CM | POA: Insufficient documentation

## 2019-01-19 DIAGNOSIS — M65321 Trigger finger, right index finger: Secondary | ICD-10-CM

## 2019-01-19 MED ORDER — CELECOXIB 200 MG PO CAPS: 200.0000 mg | ORAL_CAPSULE | Freq: Two times a day (BID) | ORAL | 5 refills | Status: DC

## 2019-01-19 NOTE — Progress Notes (Signed)
   Subjective:    Patient ID: Michael Garner, male    DOB: 1955/10/25, 63 y.o.   MRN: [id number]  HPI Here for several issues. First he has had worsening stiffness and pain in both hands and all the fingers. Meloxicam did not help very much. No obvious swelling. Also his right index finger is often stuck in a flexed position when he wakes up in the morning, and he often has to use his other hand to pop it open. Lastly he thinks he has a hernia because his abdomen balloons out when he does a sit up. There is no pain at all.    Review of Systems  Respiratory: Negative.   Cardiovascular: Negative.   Gastrointestinal: Negative for abdominal distention, abdominal pain, anal bleeding, blood in stool, constipation, diarrhea, nausea, rectal pain and vomiting.  Musculoskeletal: Positive for arthralgias. Negative for joint swelling.       Objective:   Physical Exam Constitutional:      Appearance: Normal appearance.  Cardiovascular:     Rate and Rhythm: Normal rate and regular rhythm.     Pulses: Normal pulses.     Heart sounds: Normal heart sounds.  Pulmonary:     Effort: Pulmonary effort is normal.     Breath sounds: Normal breath sounds.  Abdominal:     General: Abdomen is flat. Bowel sounds are normal. There is no distension.     Palpations: There is no mass.     Tenderness: There is no abdominal tenderness. There is no guarding or rebound.     Hernia: No hernia is present.     Comments: He has a diastasis recti in the central abdomen    Neurological:     Mental Status: He is alert.           Assessment & Plan:  He has osteoarthritis in the hands, so he will try Celebrex 200 mg bid. He has a trigger finger so we will refer him to Hand Surgery. He has a diastasis recti. I explained that this is not a hernia and that it will not require a surgery. The best way to make it go away is to lose some weight.  Alysia Penna, MD

## 2019-01-23 ENCOUNTER — Encounter: Payer: Self-pay | Admitting: Family Medicine

## 2019-02-12 ENCOUNTER — Encounter: Payer: Self-pay | Admitting: Family Medicine

## 2019-04-10 DIAGNOSIS — J189 Pneumonia, unspecified organism: Secondary | ICD-10-CM

## 2019-04-10 HISTORY — DX: Pneumonia, unspecified organism: J18.9

## 2019-04-17 ENCOUNTER — Encounter: Payer: Self-pay | Admitting: Family Medicine

## 2019-04-19 NOTE — Telephone Encounter (Signed)
It is possible. Have him enroll on [email or web address]

## 2019-06-24 ENCOUNTER — Other Ambulatory Visit: Payer: Self-pay

## 2019-06-24 ENCOUNTER — Encounter (HOSPITAL_COMMUNITY): Payer: Self-pay | Admitting: Physician Assistant

## 2019-06-24 ENCOUNTER — Ambulatory Visit (HOSPITAL_COMMUNITY)
Admission: RE | Admit: 2019-06-24 | Discharge: 2019-06-24 | Disposition: A | Discharge status: Home / Self Care | Payer: 59 | Source: Ambulatory Visit | Attending: Physician Assistant | Admitting: Physician Assistant

## 2019-06-24 VITALS — BP 142/70 | HR 143 | Ht 70.0 in | Wt 253.4 lb

## 2019-06-24 DIAGNOSIS — I480 Paroxysmal atrial fibrillation: Secondary | ICD-10-CM | POA: Insufficient documentation

## 2019-06-24 DIAGNOSIS — Z79899 Other long term (current) drug therapy: Secondary | ICD-10-CM | POA: Diagnosis not present

## 2019-06-24 DIAGNOSIS — E669 Obesity, unspecified: Secondary | ICD-10-CM | POA: Insufficient documentation

## 2019-06-24 DIAGNOSIS — Z87891 Personal history of nicotine dependence: Secondary | ICD-10-CM | POA: Diagnosis not present

## 2019-06-24 DIAGNOSIS — Z7901 Long term (current) use of anticoagulants: Secondary | ICD-10-CM | POA: Diagnosis not present

## 2019-06-24 DIAGNOSIS — G4733 Obstructive sleep apnea (adult) (pediatric): Secondary | ICD-10-CM | POA: Insufficient documentation

## 2019-06-24 DIAGNOSIS — Z6836 Body mass index (BMI) 36.0-36.9, adult: Secondary | ICD-10-CM | POA: Diagnosis not present

## 2019-06-24 MED ORDER — METOPROLOL TARTRATE 25 MG PO TABS: 25.0000 mg | ORAL_TABLET | Freq: Two times a day (BID) | ORAL | 3 refills | Status: DC

## 2019-06-24 MED ORDER — APIXABAN 5 MG PO TABS: 5.0000 mg | ORAL_TABLET | Freq: Two times a day (BID) | ORAL | 3 refills | Status: DC

## 2019-06-24 NOTE — Progress Notes (Signed)
Primary Care Physician: Laurey Morale, MD Referring Physician: [facility]   Michael Garner is a 64 y.o. male with a history of paroxysmal atrial fibrillation who presents for follow up in the [facility].  The patient was initially diagnosed with atrial fibrillation in the ER on 05/16/18 after presenting with symptoms of palpitations and heart racing. EMS transported him to the ER and he was found to be in Afib with RVR. Of note, he was recently prescribed prednisone for his chronic back issues prior to the onset of afib. Patient had a sleep study which showed severe OSA. He did not follow up for CPAP titration. He does admit to drinking 2-3 alcoholic drinks daily.   On follow up today, patient reports that he has had intermittent, brief palpitations which resolve quickly over the last several months. Two weeks ago, he did some strenuous yard work and has had more palpitations associated with SOB since then.   Today, he denies symptoms of chest pain, orthopnea, PND, lower extremity edema, dizziness, presyncope, syncope, bleeding, or neurologic sequela. The patient is tolerating medications without difficulties and is otherwise without complaint today.    Atrial Fibrillation Risk Factors:  he does have symptoms or diagnosis of sleep apnea. He is not compliant with CPAP therapy. he does not have a history of rheumatic fever. he does have a history of alcohol use. The patient does not have a history of early familial atrial fibrillation or other arrhythmias.  he has a BMI of Body mass index is 36.36 kg/m.[name] Filed Weights   06/24/19 1440  Weight: 114.9 kg    Family History  Problem Relation Age of Onset  . Alcohol abuse Father   . Lung cancer Father      Atrial Fibrillation Management history:  Previous antiarrhythmic drugs: none Previous cardioversions: 05/16/18 Previous ablations: none CHADS2VASC score: 0 Anticoagulation history: Eliquis   Past  Medical History:  Diagnosis Date  . BPH with urinary obstruction   . Erectile dysfunction   . Hepatitis B infection    resolved    Past Surgical History:  Procedure Laterality Date  . CARDIOVERSION  05/16/2018  . COLONOSCOPY  07-18-11    per Dr. Sharlett Iles, diverticulosis only, repeat in 10 yrs   . LUMBAR MICRODISCECTOMY  2002    Current Outpatient Medications  Medication Sig Dispense Refill  . celecoxib (CELEBREX) 200 MG capsule Take 1 capsule (200 mg total) by mouth 2 (two) times daily. 60 capsule 5  . ibuprofen (ADVIL,MOTRIN) 400 MG tablet Take 400 mg by mouth every 6 (six) hours as needed for mild pain.     . tadalafil (CIALIS) 20 MG tablet Take 1 tablet (20 mg total) by mouth as needed for erectile dysfunction. 10 tablet 11  . tamsulosin (FLOMAX) 0.4 MG CAPS capsule Take 0.4 mg by mouth daily.    [name] apixaban (ELIQUIS) 5 MG TABS tablet Take 1 tablet (5 mg total) by mouth 2 (two) times daily. 60 tablet 3  . metoprolol tartrate (LOPRESSOR) 25 MG tablet Take 1 tablet (25 mg total) by mouth 2 (two) times daily. 60 tablet 3   No current facility-administered medications for this encounter.    Allergies  Allergen Reactions  . Penicillins     Not sure but immediate family members are allergic    Social History   Socioeconomic History  . Marital status: Divorced    Spouse name: Not on file  . Number of children: Not on file  .  Years of education: Not on file  . Highest education level: Not on file  Occupational History  . Not on file  Tobacco Use  . Smoking status: Former Research scientist (life sciences)  . Smokeless tobacco: Never Used  Substance and Sexual Activity  . Alcohol use: Yes    Alcohol/week: 2.0 - 3.0 standard drinks    Types: 2 - 3 Cans of beer per week  . Drug use: No  . Sexual activity: Not on file  Other Topics Concern  . Not on file  Social History Narrative  . Not on file   Social Determinants of Health   Financial Resource Strain:   . Difficulty of Paying Living  Expenses:   Food Insecurity:   . Worried About Charity fundraiser in the Last Year:   . Arboriculturist in the Last Year:   Transportation Needs:   . Film/video editor (Medical):   [name] Lack of Transportation (Non-Medical):   Physical Activity:   . Days of Exercise per Week:   . Minutes of Exercise per Session:   Stress:   . Feeling of Stress :   Social Connections:   . Frequency of Communication with Friends and Family:   . Frequency of Social Gatherings with Friends and Family:   . Attends Religious Services:   . Active Member of Clubs or Organizations:   . Attends Archivist Meetings:   [name] Marital Status:   Intimate Partner Violence:   . Fear of Current or Ex-Partner:   . Emotionally Abused:   [name] Physically Abused:   . Sexually Abused:      ROS- All systems are reviewed and negative except as per the HPI above.  Physical Exam: Vitals:   06/24/19 1440  BP: (!) 142/70  Pulse: (!) 143  Weight: 114.9 kg  Height: 5\' 10"  (1.778 m)   GEN- The patient is well appearing obese male, alert and oriented x 3 today.   HEENT-head normocephalic, atraumatic, sclera clear, conjunctiva pink, hearing intact, trachea midline. Lungs- Clear to ausculation bilaterally, normal work of breathing Heart- irregular rate and rhythm, tachycardia, no murmurs, rubs or gallops  GI- soft, NT, ND, + BS Extremities- no clubbing, cyanosis, or edema MS- no significant deformity or atrophy Skin- no rash or lesion Psych- euthymic mood, full affect Neuro- strength and sensation are intact   Wt Readings from Last 3 Encounters:  06/24/19 114.9 kg  01/19/19 110.7 kg  11/26/18 108.9 kg    EKG today demonstrates afib HR 143, QRS 84, QTc 401  Echo 10/02/18 demonstrated 1. The left ventricle has normal systolic function with an ejection  fraction of 60-65%. The cavity size was normal. Left ventricular diastolic  Doppler parameters are consistent with impaired relaxation. No evidence of    left ventricular regional wall  motion abnormalities.  2. The right ventricle has normal systolic function. The cavity was  normal. There is no increase in right ventricular wall thickness.  3. No evidence of mitral valve stenosis. No significant mitral  regurgitation.  4. The aortic valve is tricuspid. Mild calcification of the aortic valve.  No stenosis of the aortic valve.  5. The aortic root is normal in size and structure.  6. The IVC is normal in size. No complete TR doppler jet so unable to  estimate PA systolic pressure.   Epic records are reviewed at length today  Assessment and Plan:  1. Paroxysmal atrial fibrillation Patient back in rapid afib. Likely multiple triggers with possible  dehydration, untreated OSA, and alcohol use. Will resume Lopressor 25 mg BID for rate control. Will resume Eliquis 5 mg BID in anticipation of DCCV if needed. We also discussed AAD therapy today if his afib should be persistent.   Lifestyle changes as below.  This patients CHA2DS2-VASc Score and unadjusted Ischemic Stroke Rate (% per year) is equal to 0.2 % stroke rate/year from a score of 0  Above score calculated as 1 point each if present [CHF, HTN, DM, Vascular=MI/PAD/Aortic Plaque, Age if 65-74, or Male] Above score calculated as 2 points each if present [Age > 75, or Stroke/TIA/TE]   2. Obesity Body mass index is 36.36 kg/m. Lifestyle modification was discussed and encouraged including regular physical activity and weight reduction.  3. OSA The importance of adequate treatment of sleep apnea was discussed today in order to improve our ability to maintain sinus rhythm long term. Patient did not follow up for CPAP titration. Encouraged him to reach out to make appt.   Follow up in the AF clinic in one week.   [facility] [address] [place], [place] [address] [phone] 06/24/2019 3:25 PM

## 2019-06-24 NOTE — Patient Instructions (Signed)
Start metoprolol 25mg  twice a day  Start Eliquis 5mg  twice a day  Stop aspirin

## 2019-06-25 ENCOUNTER — Ambulatory Visit (HOSPITAL_COMMUNITY): Payer: 59 | Admitting: Physician Assistant

## 2019-07-02 ENCOUNTER — Telehealth: Payer: Self-pay | Admitting: *Deleted

## 2019-07-02 DIAGNOSIS — G4700 Insomnia, unspecified: Secondary | ICD-10-CM

## 2019-07-02 NOTE — Telephone Encounter (Signed)
Patients insurance recommended he have a APAP.  DME selection is [facility]. Patient understands he will be contacted by [facility] to set up his cpap. Patient understands to call if [facility] does not contact him with new setup in a timely manner. Patient understands they will be called once confirmation has been received from [facility] that they have received their new machine to schedule 10 week follow up appointment.  [facility] notified of new cpap order  Please add to airview Patient was grateful for the call and thanked me.

## 2019-07-02 NOTE — Telephone Encounter (Signed)
ResMed CPAP on auto from 4 to 20cm H2O with heated humidity and mask of choice and followup with me in 8 weeks

## 2019-07-02 NOTE — Telephone Encounter (Signed)
-----   Message from Sueanne Margarita, MD sent at 07/02/2019 11:51 AM EDT ----- Regarding: RE: APAP RECOMMENDED Order ResMed CPAP on auto from 4 to 20cm H2O with heated humidity and mask of choice and followup with me in 8 weeks  Traci ----- Message ----- From: Freada Bergeron, CMA Sent: 07/02/2019   9:45 AM EDT To: Sueanne Margarita, MD Subject: [facility] denied CPAP titration. APAP is recommended.   Please write settings. Thanks

## 2019-07-02 NOTE — Progress Notes (Signed)
Primary Care Physician: Laurey Morale, MD Referring Physician: [facility]   Michael Garner is a 64 y.o. male with a history of paroxysmal atrial fibrillation who presents for follow up in the [facility].  The patient was initially diagnosed with atrial fibrillation in the ER on 05/16/18 after presenting with symptoms of palpitations and heart racing. EMS transported him to the ER and he was found to be in Afib with RVR. Of note, he was recently prescribed prednisone for his chronic back issues prior to the onset of afib. He was cardioverted in the ER and discharged on Eliquis and metoprolol. Patient had a sleep study which showed severe OSA. He did not follow up for CPAP titration. He does admit to drinking 2-3 alcoholic drinks daily.   On follow up today, patient reports that he has done well since his last visit. He converted to SR after resuming his BB. His smart watch has shown SR since then. He is tolerating the medication without difficulty. He is scheduled to get an APAP machine soon.  Today, he denies symptoms of palpitations, chest pain, orthopnea, PND, lower extremity edema, dizziness, presyncope, syncope, bleeding, or neurologic sequela. The patient is tolerating medications without difficulties and is otherwise without complaint today.    Atrial Fibrillation Risk Factors:  he does have symptoms or diagnosis of sleep apnea. He is scheduled for APAP. he does not have a history of rheumatic fever. he does have a history of alcohol use. The patient does not have a history of early familial atrial fibrillation or other arrhythmias.  he has a BMI of Body mass index is 36.53 kg/m.[name] Filed Weights   07/03/19 0916  Weight: 115.5 kg    Family History  Problem Relation Age of Onset  . Alcohol abuse Father   . Lung cancer Father      Atrial Fibrillation Management history:  Previous antiarrhythmic drugs: none Previous cardioversions:  05/16/18 Previous ablations: none CHADS2VASC score: 0 Anticoagulation history: Eliquis   Past Medical History:  Diagnosis Date  . BPH with urinary obstruction   . Erectile dysfunction   . Hepatitis B infection    resolved    Past Surgical History:  Procedure Laterality Date  . CARDIOVERSION  05/16/2018  . COLONOSCOPY  07-18-11    per Dr. Sharlett Iles, diverticulosis only, repeat in 10 yrs   . LUMBAR MICRODISCECTOMY  2002    Current Outpatient Medications  Medication Sig Dispense Refill  . apixaban (ELIQUIS) 5 MG TABS tablet Take 1 tablet (5 mg total) by mouth 2 (two) times daily. 60 tablet 3  . celecoxib (CELEBREX) 200 MG capsule Take 1 capsule (200 mg total) by mouth 2 (two) times daily. 60 capsule 5  . ibuprofen (ADVIL,MOTRIN) 400 MG tablet Take 400 mg by mouth as needed for mild pain.     . metoprolol tartrate (LOPRESSOR) 25 MG tablet Take 1 tablet (25 mg total) by mouth 2 (two) times daily. 60 tablet 3  . tadalafil (CIALIS) 20 MG tablet Take 1 tablet (20 mg total) by mouth as needed for erectile dysfunction. 10 tablet 11  . tamsulosin (FLOMAX) 0.4 MG CAPS capsule Take 0.4 mg by mouth daily.     No current facility-administered medications for this encounter.    Allergies  Allergen Reactions  . Penicillins     Not sure but immediate family members are allergic    Social History   Socioeconomic History  . Marital status: Divorced    Spouse  name: Not on file  . Number of children: Not on file  . Years of education: Not on file  . Highest education level: Not on file  Occupational History  . Not on file  Tobacco Use  . Smoking status: Former Research scientist (life sciences)  . Smokeless tobacco: Never Used  Substance and Sexual Activity  . Alcohol use: Yes    Alcohol/week: 2.0 - 3.0 standard drinks    Types: 2 - 3 Cans of beer per week  . Drug use: No  . Sexual activity: Not on file  Other Topics Concern  . Not on file  Social History Narrative  . Not on file   Social Determinants of  Health   Financial Resource Strain:   . Difficulty of Paying Living Expenses:   Food Insecurity:   . Worried About Charity fundraiser in the Last Year:   . Arboriculturist in the Last Year:   Transportation Needs:   . Film/video editor (Medical):   [name] Lack of Transportation (Non-Medical):   Physical Activity:   . Days of Exercise per Week:   . Minutes of Exercise per Session:   Stress:   . Feeling of Stress :   Social Connections:   . Frequency of Communication with Friends and Family:   . Frequency of Social Gatherings with Friends and Family:   . Attends Religious Services:   . Active Member of Clubs or Organizations:   . Attends Archivist Meetings:   [name] Marital Status:   Intimate Partner Violence:   . Fear of Current or Ex-Partner:   . Emotionally Abused:   [name] Physically Abused:   . Sexually Abused:      ROS- All systems are reviewed and negative except as per the HPI above.  Physical Exam: Vitals:   07/03/19 0916  BP: 116/82  Pulse: 63  Weight: 115.5 kg  Height: 5\' 10"  (1.778 m)    GEN- The patient is well appearing obese male, alert and oriented x 3 today.   HEENT-head normocephalic, atraumatic, sclera clear, conjunctiva pink, hearing intact, trachea midline. Lungs- Clear to ausculation bilaterally, normal work of breathing Heart- Regular rate and rhythm, no murmurs, rubs or gallops  GI- soft, NT, ND, + BS Extremities- no clubbing, cyanosis, or edema MS- no significant deformity or atrophy Skin- no rash or lesion Psych- euthymic mood, full affect Neuro- strength and sensation are intact   Wt Readings from Last 3 Encounters:  07/03/19 115.5 kg  06/24/19 114.9 kg  01/19/19 110.7 kg    EKG today demonstrates SR HR 63, inc RBBB, PR 154, QRS 84, QTc 425  Echo 10/02/18 demonstrated 1. The left ventricle has normal systolic function with an ejection  fraction of 60-65%. The cavity size was normal. Left ventricular diastolic  Doppler  parameters are consistent with impaired relaxation. No evidence of  left ventricular regional wall  motion abnormalities.  2. The right ventricle has normal systolic function. The cavity was  normal. There is no increase in right ventricular wall thickness.  3. No evidence of mitral valve stenosis. No significant mitral  regurgitation.  4. The aortic valve is tricuspid. Mild calcification of the aortic valve.  No stenosis of the aortic valve.  5. The aortic root is normal in size and structure.  6. The IVC is normal in size. No complete TR doppler jet so unable to  estimate PA systolic pressure.   Epic records are reviewed at length today  Assessment and Plan:  1. Paroxysmal atrial fibrillation Patient converted to SR with BB. No further heart racing. Continue Lopressor 25 mg BID for rate control. Given his low stroke risk and no need for DCCV, will stop Eliquis at this time.  Will focus on aggressive lifestyle modification including alcohol cessation.   This patients CHA2DS2-VASc Score and unadjusted Ischemic Stroke Rate (% per year) is equal to 0.2 % stroke rate/year from a score of 0  Above score calculated as 1 point each if present [CHF, HTN, DM, Vascular=MI/PAD/Aortic Plaque, Age if 65-74, or Male] Above score calculated as 2 points each if present [Age > 75, or Stroke/TIA/TE]  2. Obesity Body mass index is 36.53 kg/m. Lifestyle modification was discussed and encouraged including regular physical activity and weight reduction. Patient plans to resume exercising on a regular basis.  3. OSA The importance of adequate treatment of sleep apnea was discussed today in order to improve our ability to maintain sinus rhythm long term. Patient scheduled for APAP titration.   Follow up in the AF clinic in 3 months.   [facility] [address] [place], [place] [address] [phone] 07/03/2019 9:38 AM

## 2019-07-02 NOTE — Telephone Encounter (Signed)
Michael Garner, CMA  Michael Garner, [name] denied CPAP titration. APAP is recommended. Please notify provider

## 2019-07-02 NOTE — Telephone Encounter (Signed)
Order placed to [facility]  

## 2019-07-03 ENCOUNTER — Other Ambulatory Visit: Payer: Self-pay

## 2019-07-03 ENCOUNTER — Ambulatory Visit (HOSPITAL_COMMUNITY)
Admission: RE | Admit: 2019-07-03 | Discharge: 2019-07-03 | Disposition: A | Discharge status: Home / Self Care | Payer: 59 | Source: Ambulatory Visit | Attending: Physician Assistant | Admitting: Physician Assistant

## 2019-07-03 VITALS — BP 116/82 | HR 63 | Ht 70.0 in | Wt 254.6 lb

## 2019-07-03 DIAGNOSIS — Z801 Family history of malignant neoplasm of trachea, bronchus and lung: Secondary | ICD-10-CM | POA: Insufficient documentation

## 2019-07-03 DIAGNOSIS — I4891 Unspecified atrial fibrillation: Secondary | ICD-10-CM | POA: Diagnosis present

## 2019-07-03 DIAGNOSIS — Z880 Allergy status to penicillin: Secondary | ICD-10-CM | POA: Insufficient documentation

## 2019-07-03 DIAGNOSIS — E669 Obesity, unspecified: Secondary | ICD-10-CM | POA: Diagnosis not present

## 2019-07-03 DIAGNOSIS — Z6836 Body mass index (BMI) 36.0-36.9, adult: Secondary | ICD-10-CM | POA: Insufficient documentation

## 2019-07-03 DIAGNOSIS — G4733 Obstructive sleep apnea (adult) (pediatric): Secondary | ICD-10-CM | POA: Insufficient documentation

## 2019-07-03 DIAGNOSIS — Z8619 Personal history of other infectious and parasitic diseases: Secondary | ICD-10-CM | POA: Diagnosis not present

## 2019-07-03 DIAGNOSIS — Z7901 Long term (current) use of anticoagulants: Secondary | ICD-10-CM | POA: Insufficient documentation

## 2019-07-03 DIAGNOSIS — Z79899 Other long term (current) drug therapy: Secondary | ICD-10-CM | POA: Diagnosis not present

## 2019-07-03 DIAGNOSIS — I480 Paroxysmal atrial fibrillation: Secondary | ICD-10-CM

## 2019-07-03 DIAGNOSIS — Z87891 Personal history of nicotine dependence: Secondary | ICD-10-CM | POA: Diagnosis not present

## 2019-07-14 ENCOUNTER — Other Ambulatory Visit: Payer: Self-pay | Admitting: Family Medicine

## 2019-07-26 ENCOUNTER — Emergency Department (HOSPITAL_COMMUNITY)
Admission: EM | Admit: 2019-07-26 | Discharge: 2019-07-26 | Disposition: A | Discharge status: Home / Self Care | Payer: 59 | Attending: Emergency Medicine | Admitting: Emergency Medicine

## 2019-07-26 ENCOUNTER — Encounter (HOSPITAL_COMMUNITY): Payer: Self-pay

## 2019-07-26 ENCOUNTER — Other Ambulatory Visit: Payer: Self-pay

## 2019-07-26 DIAGNOSIS — R1030 Lower abdominal pain, unspecified: Secondary | ICD-10-CM | POA: Insufficient documentation

## 2019-07-26 DIAGNOSIS — R339 Retention of urine, unspecified: Secondary | ICD-10-CM | POA: Insufficient documentation

## 2019-07-26 DIAGNOSIS — N400 Enlarged prostate without lower urinary tract symptoms: Secondary | ICD-10-CM | POA: Diagnosis not present

## 2019-07-26 LAB — URINALYSIS, ROUTINE W REFLEX MICROSCOPIC
Bacteria, UA: NONE SEEN
Bilirubin Urine: NEGATIVE
Glucose, UA: NEGATIVE mg/dL
Ketones, ur: NEGATIVE mg/dL
Nitrite: NEGATIVE
Protein, ur: NEGATIVE mg/dL
Specific Gravity, Urine: 1.017 (ref 1.005–1.030)
WBC, UA: >50 WBC/hpf — ABNORMAL HIGH (ref 0–5)
pH: 5 (ref 5.0–8.0)

## 2019-07-26 MED ORDER — LIDOCAINE HCL URETHRAL/MUCOSAL 2 % EX GEL
1.0000 "application " | Freq: Once | CUTANEOUS | Status: AC
  Administered 2019-07-26: 1 via URETHRAL
  Filled 2019-07-26: qty 30

## 2019-07-26 NOTE — ED Provider Notes (Signed)
[facility] Emergency Department Provider Note MRN:  [id number]  Arrival date & time: 07/26/19     Chief Complaint   Urinary Retention   History of Present Illness   Michael Garner is a 64 y.o. year-old male with a history of BPH presenting to the ED with chief complaint of urinary retention.  Chronic urinary issues related to enlarged prostate.  Worsening urinary retention over the past 2 or 3 days, having to strain quite a bit to pass urine.  Hardly any urine output today.  Endorsing pain and pressure to the lower abdomen.  Denies fever, no chest pain or shortness of breath, no other symptoms.  Pain is moderate to severe, constant, progressively worsening.  Review of Systems  A complete 10 system review of systems was obtained and all systems are negative except as noted in the HPI and PMH.   Patient's Health History    Past Medical History:  Diagnosis Date  . BPH with urinary obstruction   . Erectile dysfunction   . Hepatitis B infection    resolved     Past Surgical History:  Procedure Laterality Date  . CARDIOVERSION  05/16/2018  . COLONOSCOPY  07-18-11    per Dr. Sharlett Iles, diverticulosis only, repeat in 10 yrs   . LUMBAR MICRODISCECTOMY  2002    Family History  Problem Relation Age of Onset  . Alcohol abuse Father   . Lung cancer Father     Social History   Socioeconomic History  . Marital status: Divorced    Spouse name: Not on file  . Number of children: Not on file  . Years of education: Not on file  . Highest education level: Not on file  Occupational History  . Not on file  Tobacco Use  . Smoking status: Former Research scientist (life sciences)  . Smokeless tobacco: Never Used  Substance and Sexual Activity  . Alcohol use: Yes    Alcohol/week: 2.0 - 3.0 standard drinks    Types: 2 - 3 Cans of beer per week  . Drug use: No  . Sexual activity: Not on file  Other Topics Concern  . Not on file  Social History Narrative  . Not on file   Social  Determinants of Health   Financial Resource Strain:   . Difficulty of Paying Living Expenses:   Food Insecurity:   . Worried About Charity fundraiser in the Last Year:   . Arboriculturist in the Last Year:   Transportation Needs:   . Film/video editor (Medical):   [name] Lack of Transportation (Non-Medical):   Physical Activity:   . Days of Exercise per Week:   . Minutes of Exercise per Session:   Stress:   . Feeling of Stress :   Social Connections:   . Frequency of Communication with Friends and Family:   . Frequency of Social Gatherings with Friends and Family:   . Attends Religious Services:   . Active Member of Clubs or Organizations:   . Attends Archivist Meetings:   [name] Marital Status:   Intimate Partner Violence:   . Fear of Current or Ex-Partner:   . Emotionally Abused:   [name] Physically Abused:   . Sexually Abused:      Physical Exam   Vitals:   07/26/19 1919  BP: (!) 151/94  Pulse: 96  Resp: 18  Temp: 98 F (36.7 C)  SpO2: 95%    CONSTITUTIONAL: Well-appearing, NAD NEURO:  Alert and oriented x  3, no focal deficits EYES:  eyes equal and reactive ENT/NECK:  no LAD, no JVD CARDIO: Regular rate, well-perfused, normal S1 and S2 PULM:  CTAB no wheezing or rhonchi GI/GU:  normal bowel sounds, non-distended, non-tender MSK/SPINE:  No gross deformities, no edema SKIN:  no rash, atraumatic PSYCH:  Appropriate speech and behavior  *Additional and/or pertinent findings included in MDM below  Diagnostic and Interventional Summary    EKG Interpretation  Date/Time:    Ventricular Rate:    PR Interval:    QRS Duration:   QT Interval:    QTC Calculation:   R Axis:     Text Interpretation:        Labs Reviewed  URINALYSIS, ROUTINE W REFLEX MICROSCOPIC - Abnormal; Notable for the following components:      Result Value   Hgb urine dipstick SMALL (*)    Leukocytes,Ua MODERATE (*)    WBC, UA >50 (*)    All other components within normal limits    URINE CULTURE    No orders to display    Medications  lidocaine (XYLOCAINE) 2 % jelly 1 application (1 application Urethral Given 07/26/19 2006)     Procedures  /  Critical Care Procedures  ED Course and Medical Decision Making  I have reviewed the triage vital signs, the nursing notes, and pertinent available records from the EMR.  Listed above are laboratory and imaging tests that I personally ordered, reviewed, and interpreted and then considered in my medical decision making (see below for details).      History of BPH here with worsening urinary retention.  Patient has an established urologist in town with [facility], he is agreeable with plan to place Foley catheter and send him home.  No indication for laboratory evaluation if we successfully drain his urine with the catheter.  Has close follow-up.  Patient is feeling much better, greater than 1000 cc removed from the catheter.  Urinalysis is with some white blood cells, but nitrate negative, will send for culture.  Appropriate for discharge with [facility] follow-up.  Barth Kirks. Sedonia Small, [place] [email or web address]  Final Clinical Impressions(s) / ED Diagnoses     ICD-10-CM   1. Urinary retention  R33.9   2. Enlarged prostate  N40.0     ED Discharge Orders    None       Discharge Instructions Discussed with and Provided to Patient:     Discharge Instructions     You were evaluated in the Emergency Department and after careful evaluation, we did not find any emergent condition requiring admission or further testing in the hospital.  Your exam/testing today was overall reassuring.  Please follow-up with your urologist for further management and eventual removal of the Foley catheter.  Please return to the Emergency Department if you experience any worsening of your condition.  We encourage you to follow up with a primary care provider.  Thank you for  allowing [place] to be a part of your care.        Maudie Flakes, MD 07/26/19 2033

## 2019-07-26 NOTE — ED Triage Notes (Signed)
Prostate procedure back on 3/8. Retention for last couple days. Unable to void today.

## 2019-07-26 NOTE — Discharge Instructions (Addendum)
You were evaluated in the Emergency Department and after careful evaluation, we did not find any emergent condition requiring admission or further testing in the hospital.  Your exam/testing today was overall reassuring.  Please follow-up with your urologist for further management and eventual removal of the Foley catheter.  Please return to the Emergency Department if you experience any worsening of your condition.  We encourage you to follow up with a primary care provider.  Thank you for allowing [place] to be a part of your care.

## 2019-07-28 LAB — URINE CULTURE: Culture: NO GROWTH

## 2019-08-03 ENCOUNTER — Encounter: Payer: Self-pay | Admitting: Family Medicine

## 2019-08-04 NOTE — Telephone Encounter (Signed)
He needs to follow up with the [facility] Surgery clinic. They are the ones treating this

## 2019-08-31 NOTE — Telephone Encounter (Signed)
Patient has a 10 week follow up appointment scheduled for 09/29/19 10 AM. Patient understands he needs to keep this appointment for insurance compliance. Patient was grateful for the call and thanked me.

## 2019-09-29 ENCOUNTER — Other Ambulatory Visit: Payer: Self-pay

## 2019-09-29 ENCOUNTER — Telehealth (INDEPENDENT_AMBULATORY_CARE_PROVIDER_SITE_OTHER): Payer: 59 | Admitting: Cardiology

## 2019-09-29 ENCOUNTER — Encounter: Payer: Self-pay | Admitting: Cardiology

## 2019-09-29 VITALS — HR 69 | Ht 70.0 in | Wt 240.0 lb

## 2019-09-29 DIAGNOSIS — E669 Obesity, unspecified: Secondary | ICD-10-CM

## 2019-09-29 DIAGNOSIS — I10 Essential (primary) hypertension: Secondary | ICD-10-CM

## 2019-09-29 DIAGNOSIS — G4733 Obstructive sleep apnea (adult) (pediatric): Secondary | ICD-10-CM | POA: Diagnosis not present

## 2019-09-29 MED ORDER — METOPROLOL TARTRATE 25 MG PO TABS: 25.0000 mg | ORAL_TABLET | Freq: Two times a day (BID) | ORAL | 3 refills | Status: DC

## 2019-09-29 NOTE — Progress Notes (Signed)
Virtual Visit via Telephone Note   This visit type was conducted due to national recommendations for restrictions regarding the COVID-19 Pandemic (e.g. social distancing) in an effort to limit this patient's exposure and mitigate transmission in our community.  Due to his co-morbid illnesses, this patient is at least at moderate risk for complications without adequate follow up.  This format is felt to be most appropriate for this patient at this time.  The patient did not have access to video technology/had technical difficulties with video requiring transitioning to audio format only (telephone).  All issues noted in this document were discussed and addressed.  No physical exam could be performed with this format.  Please refer to the patient's chart for his  consent to telehealth for [facility].   Evaluation Performed:  Follow-up visit  This visit type was conducted due to national recommendations for restrictions regarding the COVID-19 Pandemic (e.g. social distancing).  This format is felt to be most appropriate for this patient at this time.  All issues noted in this document were discussed and addressed.  No physical exam was performed (except for noted visual exam findings with Video Visits).  Please refer to the patient's chart (MyChart message for video visits and phone note for telephone visits) for the patient's consent to telehealth for [facility].  Date:  09/29/2019   ID:  Michael Garner, DOB Nov 07, 1955, MRN [id number]  Patient Location:  Home  Provider location:   [place]  PCP:  Laurey Morale, MD  Cardiologist:  Adline Peals, PA Electrophysiologist:  None   Chief Complaint:  OSA  History of Present Illness:    Michael Garner is a 64 y.o. male who presents via audio/video conferencing for a telehealth visit today.    This is a 64yo male with PAF and obesity. He is followed in afib clinic and was referred for sleep study due to afib.  He says that he would wake up in  the am and would feel like he was not getting enough oxygen and would have to take deep breaths.  He has problems with feeling tired during the day but could not nap due to his job.  He has been told that he snores as well.  He underwent home sleep study that showed severe OSA with AHI of 44.8/hr with no significant central apnea and drop in O2 sats as low as 83%.  He had significant snoring during sleep. He was started on CPAP and is now here for followup.  He is doing well with his CPAP device and thinks that he has gotten used to it.  He tolerates the mask and feels the pressure is adequate.  Since going on CPAP he feels rested in the am and has no significant daytime sleepiness.  He denies any significant mouth or nasal dryness or nasal congestion.  He does not think that he snores.    The patient does not have symptoms concerning for COVID-19 infection (fever, chills, cough, or new shortness of breath).    Prior CV studies:   The following studies were reviewed today:  Home sleep study  Past Medical History:  Diagnosis Date  . BPH with urinary obstruction   . Erectile dysfunction   . Hepatitis B infection    resolved    Past Surgical History:  Procedure Laterality Date  . CARDIOVERSION  05/16/2018  . COLONOSCOPY  07-18-11    per Dr. Sharlett Iles, diverticulosis only, repeat in 10 yrs   . LUMBAR MICRODISCECTOMY  2002     Current Meds  Medication Sig  . celecoxib (CELEBREX) 200 MG capsule TAKE ONE CAPSULE BY MOUTH TWICE A DAY  . ibuprofen (ADVIL,MOTRIN) 400 MG tablet Take 400 mg by mouth as needed for mild pain.   . metoprolol tartrate (LOPRESSOR) 25 MG tablet Take 1 tablet (25 mg total) by mouth 2 (two) times daily.  . tadalafil (CIALIS) 20 MG tablet Take 1 tablet (20 mg total) by mouth as needed for erectile dysfunction.  . [DISCONTINUED] metoprolol tartrate (LOPRESSOR) 25 MG tablet Take 1 tablet (25 mg total) by mouth 2 (two) times daily.     Allergies:   Penicillins   Social  History   Tobacco Use  . Smoking status: Former Research scientist (life sciences)  . Smokeless tobacco: Never Used  Substance Use Topics  . Alcohol use: Yes    Alcohol/week: 2.0 - 3.0 standard drinks    Types: 2 - 3 Cans of beer per week  . Drug use: No     Family Hx: The patient's family history includes Alcohol abuse in his father; Lung cancer in his father.  ROS:   Please see the history of present illness.     All other systems reviewed and are negative.   Labs/Other Tests and Data Reviewed:    Recent Labs: No results found for requested labs within last 8760 hours.   Recent Lipid Panel Lab Results  Component Value Date/Time   CHOL 236 (H) 09/25/2017 02:32 PM   TRIG 81.0 09/25/2017 02:32 PM   HDL 88.30 09/25/2017 02:32 PM   CHOLHDL 3 09/25/2017 02:32 PM   LDLCALC 131 (H) 09/25/2017 02:32 PM   LDLDIRECT 128.0 05/08/2013 08:02 AM    Wt Readings from Last 3 Encounters:  09/29/19 240 lb (108.9 kg)  07/26/19 250 lb (113.4 kg)  07/03/19 254 lb 9.6 oz (115.5 kg)     Objective:    Vital Signs:  Pulse 69   Ht 5\' 10"  (1.778 m)   Wt 240 lb (108.9 kg)   BMI 34.44 kg/m     ASSESSMENT & PLAN:    1.  OSA - The patient is tolerating PAP therapy well without any problems. The PAP download was reviewed today and showed an AHI of 5.9/hr on auto PAP cm H2O with 100% compliance in using more than 4 hours nightly.  The patient has been using and benefiting from PAP use and will continue to benefit from therapy.  -he appears to have a large mask leak -I have asked him to try to avoid sleeping on his back which will help bring down his AHI  2.  HTN -Continue lopressor 25mg  BID  3.  Obesity -I have encouraged him to get into a routine exercise program and cut back on carbs and portions.    COVID-19 Education: The signs and symptoms of COVID-19 were discussed with the patient and how to seek care for testing (follow up with PCP or arrange E-visit).  The importance of social distancing was discussed  today.  Patient Risk:   After full review of this patient's clinical status, I feel that they are at least moderate risk at this time.  Time:   Today, I have spent 20 minutes on telemedicine discussing medical problems including OSA, HTN, Obesity and reviewing patient's chart including home sleep study and PAP compliance download.  Medication Adjustments/Labs and Tests Ordered: Current medicines are reviewed at length with the patient today.  Concerns regarding medicines are outlined above.  Tests Ordered: No orders  of the defined types were placed in this encounter.  Medication Changes: Meds ordered this encounter  Medications  . metoprolol tartrate (LOPRESSOR) 25 MG tablet    Sig: Take 1 tablet (25 mg total) by mouth 2 (two) times daily.    Dispense:  180 tablet    Refill:  3    Disposition:  Follow up in 1 year(s)  Signed, Fransico Him, MD  09/29/2019 10:26 AM    [facility]

## 2019-09-29 NOTE — Patient Instructions (Signed)

## 2019-10-05 ENCOUNTER — Ambulatory Visit (HOSPITAL_COMMUNITY)
Admission: RE | Admit: 2019-10-05 | Discharge: 2019-10-05 | Disposition: A | Discharge status: Home / Self Care | Payer: 59 | Source: Ambulatory Visit | Attending: Physician Assistant | Admitting: Physician Assistant

## 2019-10-05 ENCOUNTER — Encounter (HOSPITAL_COMMUNITY): Payer: Self-pay | Admitting: Physician Assistant

## 2019-10-05 ENCOUNTER — Other Ambulatory Visit: Payer: Self-pay

## 2019-10-05 VITALS — BP 124/84 | HR 50 | Ht 70.0 in | Wt 247.8 lb

## 2019-10-05 DIAGNOSIS — Z79899 Other long term (current) drug therapy: Secondary | ICD-10-CM | POA: Diagnosis not present

## 2019-10-05 DIAGNOSIS — I480 Paroxysmal atrial fibrillation: Secondary | ICD-10-CM | POA: Diagnosis not present

## 2019-10-05 DIAGNOSIS — Z87891 Personal history of nicotine dependence: Secondary | ICD-10-CM | POA: Insufficient documentation

## 2019-10-05 DIAGNOSIS — Z7901 Long term (current) use of anticoagulants: Secondary | ICD-10-CM | POA: Diagnosis not present

## 2019-10-05 DIAGNOSIS — E669 Obesity, unspecified: Secondary | ICD-10-CM | POA: Insufficient documentation

## 2019-10-05 DIAGNOSIS — Z6835 Body mass index (BMI) 35.0-35.9, adult: Secondary | ICD-10-CM | POA: Insufficient documentation

## 2019-10-05 DIAGNOSIS — G4733 Obstructive sleep apnea (adult) (pediatric): Secondary | ICD-10-CM | POA: Diagnosis not present

## 2019-10-05 MED ORDER — METOPROLOL TARTRATE 25 MG PO TABS: 12.5000 mg | ORAL_TABLET | Freq: Two times a day (BID) | ORAL | 3 refills | Status: DC

## 2019-10-05 NOTE — Progress Notes (Signed)
Primary Care Physician: Laurey Morale, MD Referring Physician: [facility]   Michael Garner is a 64 y.o. male with a history of paroxysmal atrial fibrillation who presents for follow up in the [facility]. The patient was initially diagnosed with atrial fibrillation in the ER on 05/16/18 after presenting with symptoms of palpitations and heart racing. EMS transported him to the ER and he was found to be in Afib with RVR. Of note, he was recently prescribed prednisone for his chronic back issues prior to the onset of afib. He was cardioverted in the ER and discharged on Eliquis and metoprolol. Patient had a sleep study which showed severe OSA. He does admit to drinking 2-3 alcoholic drinks daily.   On follow up today, patient reports doing well since his last visit. He denies any heart racing or palpitations. He has not seen any afib on his smart watch. He has noticed slower heart rates at home and when he exercises. He denies dizziness or fatigue.   Today, he denies symptoms of palpitations, chest pain, orthopnea, PND, lower extremity edema, dizziness, presyncope, syncope, bleeding, or neurologic sequela. The patient is tolerating medications without difficulties and is otherwise without complaint today.    Atrial Fibrillation Risk Factors:  he does have symptoms or diagnosis of sleep apnea. He is compliant with CPAP therapy.  he does not have a history of rheumatic fever. he does have a history of alcohol use. The patient does not have a history of early familial atrial fibrillation or other arrhythmias.  he has a BMI of Body mass index is 35.56 kg/m.[name] Filed Weights   10/05/19 1056  Weight: 112.4 kg    Family History  Problem Relation Age of Onset  . Alcohol abuse Father   . Lung cancer Father      Atrial Fibrillation Management history:  Previous antiarrhythmic drugs: none Previous cardioversions: 05/16/18 Previous ablations: none CHADS2VASC score:  0 Anticoagulation history: Eliquis   Past Medical History:  Diagnosis Date  . BPH with urinary obstruction   . Erectile dysfunction   . Hepatitis B infection    resolved    Past Surgical History:  Procedure Laterality Date  . CARDIOVERSION  05/16/2018  . COLONOSCOPY  07-18-11    per Dr. Sharlett Iles, diverticulosis only, repeat in 10 yrs   . LUMBAR MICRODISCECTOMY  2002    Current Outpatient Medications  Medication Sig Dispense Refill  . celecoxib (CELEBREX) 200 MG capsule TAKE ONE CAPSULE BY MOUTH TWICE A DAY 60 capsule 4  . ibuprofen (ADVIL,MOTRIN) 400 MG tablet Take 400 mg by mouth as needed for mild pain.     . metoprolol tartrate (LOPRESSOR) 25 MG tablet Take 1 tablet (25 mg total) by mouth 2 (two) times daily. 180 tablet 3  . tadalafil (CIALIS) 20 MG tablet Take 1 tablet (20 mg total) by mouth as needed for erectile dysfunction. 10 tablet 11   No current facility-administered medications for this encounter.    Allergies  Allergen Reactions  . Dextromethorphan-Guaifenesin Other (See Comments)    "feels like i'm having a heart attack"  . Penicillins     Not sure but immediate family members are allergic  . Prednisone Palpitations    Patient "put me in afib"    Social History   Socioeconomic History  . Marital status: Divorced    Spouse name: Not on file  . Number of children: Not on file  . Years of education: Not on file  .  Highest education level: Not on file  Occupational History  . Not on file  Tobacco Use  . Smoking status: Former Research scientist (life sciences)  . Smokeless tobacco: Never Used  Substance and Sexual Activity  . Alcohol use: Yes    Alcohol/week: 2.0 - 4.0 standard drinks    Types: 1 - 2 Glasses of wine, 1 - 2 Cans of beer per week  . Drug use: No  . Sexual activity: Not on file  Other Topics Concern  . Not on file  Social History Narrative  . Not on file   Social Determinants of Health   Financial Resource Strain:   . Difficulty of Paying Living Expenses:    Food Insecurity:   . Worried About Charity fundraiser in the Last Year:   . Arboriculturist in the Last Year:   Transportation Needs:   . Film/video editor (Medical):   [name] Lack of Transportation (Non-Medical):   Physical Activity:   . Days of Exercise per Week:   . Minutes of Exercise per Session:   Stress:   . Feeling of Stress :   Social Connections:   . Frequency of Communication with Friends and Family:   . Frequency of Social Gatherings with Friends and Family:   . Attends Religious Services:   . Active Member of Clubs or Organizations:   . Attends Archivist Meetings:   [name] Marital Status:   Intimate Partner Violence:   . Fear of Current or Ex-Partner:   . Emotionally Abused:   [name] Physically Abused:   . Sexually Abused:      ROS- All systems are reviewed and negative except as per the HPI above.  Physical Exam: Vitals:   10/05/19 1056  BP: 124/84  Pulse: (!) 50  Weight: 112.4 kg  Height: 5\' 10"  (1.778 m)    GEN- The patient is well appearing obese male, alert and oriented x 3 today.   HEENT-head normocephalic, atraumatic, sclera clear, conjunctiva pink, hearing intact, trachea midline. Lungs- Clear to ausculation bilaterally, normal work of breathing Heart- Regular rate and rhythm, bradycardia, no murmurs, rubs or gallops  GI- soft, NT, ND, + BS Extremities- no clubbing, cyanosis, or edema MS- no significant deformity or atrophy Skin- no rash or lesion Psych- euthymic mood, full affect Neuro- strength and sensation are intact   Wt Readings from Last 3 Encounters:  10/05/19 112.4 kg  09/29/19 108.9 kg  07/26/19 113.4 kg    EKG today demonstrates SB HR 50, PR 158, QRS 88, QTc 388  Echo 10/02/18 demonstrated 1. The left ventricle has normal systolic function with an ejection  fraction of 60-65%. The cavity size was normal. Left ventricular diastolic  Doppler parameters are consistent with impaired relaxation. No evidence of  left  ventricular regional wall  motion abnormalities.  2. The right ventricle has normal systolic function. The cavity was  normal. There is no increase in right ventricular wall thickness.  3. No evidence of mitral valve stenosis. No significant mitral  regurgitation.  4. The aortic valve is tricuspid. Mild calcification of the aortic valve.  No stenosis of the aortic valve.  5. The aortic root is normal in size and structure.  6. The IVC is normal in size. No complete TR doppler jet so unable to  estimate PA systolic pressure.   Epic records are reviewed at length today  Assessment and Plan:  1. Paroxysmal atrial fibrillation Patient appears to be maintaining SR.  Decrease Lopressor to 12.5  mg BID given bradycardia.  No anticoagulation indicated at this time.  Apple Watch for home monitoring.   This patients CHA2DS2-VASc Score and unadjusted Ischemic Stroke Rate (% per year) is equal to 0.2 % stroke rate/year from a score of 0  Above score calculated as 1 point each if present [CHF, HTN, DM, Vascular=MI/PAD/Aortic Plaque, Age if 65-74, or Male] Above score calculated as 2 points each if present [Age > 75, or Stroke/TIA/TE]  2. Obesity Body mass index is 35.56 kg/m. Lifestyle modification was discussed and encouraged including regular physical activity and weight reduction.  3. OSA Patient reports compliance with CPAP therapy. Followed by Dr Radford Pax.   Follow up in the AF clinic in 6 months.    [facility] [address] [place], [place] [address] [phone] 10/05/2019 11:12 AM

## 2019-12-18 ENCOUNTER — Other Ambulatory Visit: Payer: Self-pay | Admitting: Family Medicine

## 2020-01-12 ENCOUNTER — Encounter: Payer: Self-pay | Admitting: Family Medicine

## 2020-01-12 DIAGNOSIS — M7672 Peroneal tendinitis, left leg: Secondary | ICD-10-CM

## 2020-01-14 NOTE — Telephone Encounter (Signed)
I recommend he see [facility]. I can do a referral if he wishes

## 2020-01-14 NOTE — Progress Notes (Signed)
Primary Care Physician: Laurey Morale, MD Referring Physician: [facility]   Michael Garner is a 64 y.o. male with a history of paroxysmal atrial fibrillation and OSA who presents for follow up in the [facility]. The patient was initially diagnosed with atrial fibrillation in the ER on 05/16/18 after presenting with symptoms of palpitations and heart racing. EMS transported him to the ER and he was found to be in Afib with RVR. Of note, he was recently prescribed prednisone for his chronic back issues prior to the onset of afib. He was cardioverted in the ER and discharged on Eliquis and metoprolol. Patient had a sleep study 11/2018 which showed severe OSA.    On follow up today, patient has been having symptoms of heart racing and dyspnea with exertion intermittently for several weeks and persistently for past week. He has been out of metoprolol for several days but he reports the metoprolol did not help his symptoms when he did take it. He is compliant with his CPAP therapy. He does drink 2 alcoholic beverages daily.    Today, he denies symptoms of chest pain, orthopnea, PND, lower extremity edema, dizziness, presyncope, syncope, bleeding, or neurologic sequela. The patient is tolerating medications without difficulties and is otherwise without complaint today.    Atrial Fibrillation Risk Factors:  he does have symptoms or diagnosis of sleep apnea. He is compliant with CPAP therapy.  he does not have a history of rheumatic fever. he does have a history of alcohol use. 2-3 drinks daily The patient does not have a history of early familial atrial fibrillation or other arrhythmias.  he has a BMI of Body mass index is 35.18 kg/m.[name] Filed Weights   01/15/20 0926  Weight: 111.2 kg    Family History  Problem Relation Age of Onset   Alcohol abuse Father    Lung cancer Father      Atrial Fibrillation Management history:  Previous antiarrhythmic drugs:  none Previous cardioversions: 05/16/18 Previous ablations: none CHADS2VASC score: 0 Anticoagulation history: Eliquis   Past Medical History:  Diagnosis Date   BPH with urinary obstruction    Erectile dysfunction    Hepatitis B infection    resolved    Past Surgical History:  Procedure Laterality Date   CARDIOVERSION  05/16/2018   COLONOSCOPY  07-18-11    per Dr. Sharlett Iles, diverticulosis only, repeat in 10 yrs    LUMBAR MICRODISCECTOMY  2002    Current Outpatient Medications  Medication Sig Dispense Refill   ibuprofen (ADVIL,MOTRIN) 400 MG tablet Take 400 mg by mouth as needed for mild pain.      tadalafil (CIALIS) 20 MG tablet Take 1 tablet (20 mg total) by mouth as needed for erectile dysfunction. 10 tablet 11   apixaban (ELIQUIS) 5 MG TABS tablet Take 1 tablet (5 mg total) by mouth 2 (two) times daily. 60 tablet 3   diltiazem (CARDIZEM CD) 120 MG 24 hr capsule Take 1 capsule (120 mg total) by mouth daily. 30 capsule 3   No current facility-administered medications for this encounter.    Allergies  Allergen Reactions   Dextromethorphan-Guaifenesin Other (See Comments)    "feels like i'm having a heart attack"   Penicillins     Not sure but immediate family members are allergic   Prednisone Palpitations    Patient "put me in afib"    Social History   Socioeconomic History   Marital status: Divorced    Spouse name: Not on  file   Number of children: Not on file   Years of education: Not on file   Highest education level: Not on file  Occupational History   Not on file  Tobacco Use   Smoking status: Former Smoker   Smokeless tobacco: Never Used  Substance and Sexual Activity   Alcohol use: Yes    Alcohol/week: 2.0 - 4.0 standard drinks    Types: 1 - 2 Glasses of wine, 1 - 2 Cans of beer per week   Drug use: No   Sexual activity: Not on file  Other Topics Concern   Not on file  Social History Narrative   Not on file   Social  Determinants of Health   Financial Resource Strain:    Difficulty of Paying Living Expenses: Not on file  Food Insecurity:    Worried About [place] in the Last Year: Not on file   Ran Out of Food in the Last Year: Not on file  Transportation Needs:    Lack of Transportation (Medical): Not on file   Lack of Transportation (Non-Medical): Not on file  Physical Activity:    Days of Exercise per Week: Not on file   Minutes of Exercise per Session: Not on file  Stress:    Feeling of Stress : Not on file  Social Connections:    Frequency of Communication with Friends and Family: Not on file   Frequency of Social Gatherings with Friends and Family: Not on file   Attends Religious Services: Not on file   Active Member of Clubs or Organizations: Not on file   Attends Archivist Meetings: Not on file   Marital Status: Not on file  Intimate Partner Violence:    Fear of Current or Ex-Partner: Not on file   Emotionally Abused: Not on file   Physically Abused: Not on file   Sexually Abused: Not on file     ROS- All systems are reviewed and negative except as per the HPI above.  Physical Exam: Vitals:   01/15/20 0926  BP: 122/90  Pulse: (!) 152  Weight: 111.2 kg  Height: 5\' 10"  (1.778 m)    GEN- The patient is well appearing obese male, alert and oriented x 3 today.   HEENT-head normocephalic, atraumatic, sclera clear, conjunctiva pink, hearing intact, trachea midline. Lungs- Clear to ausculation bilaterally, normal work of breathing Heart- irregular rate and rhythm, tachycardia, no murmurs, rubs or gallops  GI- soft, NT, ND, + BS Extremities- no clubbing, cyanosis, or edema MS- no significant deformity or atrophy Skin- no rash or lesion Psych- euthymic mood, full affect Neuro- strength and sensation are intact   Wt Readings from Last 3 Encounters:  01/15/20 111.2 kg  10/05/19 112.4 kg  09/29/19 108.9 kg    EKG today demonstrates afib  HR 152, QRS 84, QTc 467  Echo 10/02/18 demonstrated 1. The left ventricle has normal systolic function with an ejection  fraction of 60-65%. The cavity size was normal. Left ventricular diastolic  Doppler parameters are consistent with impaired relaxation. No evidence of left ventricular regional wall motion abnormalities.  2. The right ventricle has normal systolic function. The cavity was  normal. There is no increase in right ventricular wall thickness.  3. No evidence of mitral valve stenosis. No significant mitral  regurgitation.  4. The aortic valve is tricuspid. Mild calcification of the aortic valve.  No stenosis of the aortic valve.  5. The aortic root is normal in size and  structure.  6. The IVC is normal in size. No complete TR doppler jet so unable to  estimate PA systolic pressure.   Epic records are reviewed at length today  Assessment and Plan:  1. Persistent atrial fibrillation Patient now in persistent afib with rapid rates. Will stop Lopressor and start diltiazem 120 mg daily. Will also resume Eliquis 5 mg BID for potential DCCV. We discussed therapeutic options long-term including AAD vs ablation. Patient would like to be considered for ablation. LA size normal on echo, compliant with CPAP. Lifestyle modification was discussed and encouraged including alcohol reduction.  This patients CHA2DS2-VASc Score and unadjusted Ischemic Stroke Rate (% per year) is equal to 0.2 % stroke rate/year from a score of 0  Above score calculated as 1 point each if present [CHF, HTN, DM, Vascular=MI/PAD/Aortic Plaque, Age if 65-74, or Male] Above score calculated as 2 points each if present [Age > 75, or Stroke/TIA/TE]  2. Obesity Body mass index is 35.18 kg/m. Lifestyle modification was discussed and encouraged including regular physical activity and weight reduction.  3. OSA Patient reports compliance with CPAP therapy. Followed by Dr Radford Pax.   Follow up in the AF  clinic in one week.    [facility] [address] [place], [place] [address] [phone] 01/15/2020 3:13 PM

## 2020-01-15 ENCOUNTER — Other Ambulatory Visit: Payer: Self-pay

## 2020-01-15 ENCOUNTER — Encounter (HOSPITAL_COMMUNITY): Payer: Self-pay | Admitting: Physician Assistant

## 2020-01-15 ENCOUNTER — Ambulatory Visit (HOSPITAL_COMMUNITY)
Admission: RE | Admit: 2020-01-15 | Discharge: 2020-01-15 | Disposition: A | Discharge status: Home / Self Care | Payer: 59 | Source: Ambulatory Visit | Attending: Physician Assistant | Admitting: Physician Assistant

## 2020-01-15 VITALS — BP 122/90 | HR 152 | Ht 70.0 in | Wt 245.2 lb

## 2020-01-15 DIAGNOSIS — E669 Obesity, unspecified: Secondary | ICD-10-CM | POA: Insufficient documentation

## 2020-01-15 DIAGNOSIS — I4819 Other persistent atrial fibrillation: Secondary | ICD-10-CM | POA: Insufficient documentation

## 2020-01-15 DIAGNOSIS — N529 Male erectile dysfunction, unspecified: Secondary | ICD-10-CM | POA: Diagnosis not present

## 2020-01-15 DIAGNOSIS — Z7901 Long term (current) use of anticoagulants: Secondary | ICD-10-CM | POA: Insufficient documentation

## 2020-01-15 DIAGNOSIS — Z87891 Personal history of nicotine dependence: Secondary | ICD-10-CM | POA: Diagnosis not present

## 2020-01-15 DIAGNOSIS — Z79899 Other long term (current) drug therapy: Secondary | ICD-10-CM | POA: Diagnosis not present

## 2020-01-15 DIAGNOSIS — Z6835 Body mass index (BMI) 35.0-35.9, adult: Secondary | ICD-10-CM | POA: Diagnosis not present

## 2020-01-15 DIAGNOSIS — G4733 Obstructive sleep apnea (adult) (pediatric): Secondary | ICD-10-CM | POA: Diagnosis not present

## 2020-01-15 MED ORDER — DILTIAZEM HCL ER COATED BEADS 120 MG PO CP24: 120.0000 mg | ORAL_CAPSULE | Freq: Every day | ORAL | 3 refills | Status: DC

## 2020-01-15 MED ORDER — APIXABAN 5 MG PO TABS: 5.0000 mg | ORAL_TABLET | Freq: Two times a day (BID) | ORAL | 3 refills | Status: DC

## 2020-01-15 NOTE — Telephone Encounter (Signed)
The referral was done  

## 2020-01-15 NOTE — Patient Instructions (Signed)
Stop metoprolol Stop celebrex  Start Eliquis 5mg  twice a day  Start Cardizem (Diltiazem) 120mg  once a day

## 2020-01-18 ENCOUNTER — Other Ambulatory Visit: Payer: Self-pay | Admitting: Family Medicine

## 2020-01-21 ENCOUNTER — Encounter: Payer: Self-pay | Admitting: Cardiology

## 2020-01-21 ENCOUNTER — Other Ambulatory Visit: Payer: Self-pay

## 2020-01-21 ENCOUNTER — Ambulatory Visit: Payer: 59 | Admitting: Cardiology

## 2020-01-21 VITALS — BP 120/70 | HR 64 | Ht 70.0 in | Wt 248.0 lb

## 2020-01-21 DIAGNOSIS — I480 Paroxysmal atrial fibrillation: Secondary | ICD-10-CM

## 2020-01-21 NOTE — Progress Notes (Signed)
Electrophysiology Office Note   Date:  01/21/2020   ID:  Michael Garner, DOB 11/02/1955, MRN [id number]  PCP:  Laurey Morale, MD  Cardiologist:   Primary Electrophysiologist:  Dhanvi Boesen Meredith Leeds, MD    Chief Complaint: Atrial fibrillation   History of Present Illness: Michael Garner is a 64 y.o. male who is being seen today for the evaluation of atrial fibrillation at the request of Fenton, Clint R, PA. Presenting today for electrophysiology evaluation.  He has a history of paroxysmal atrial fibrillation and obstructive sleep apnea.  He was diagnosed with atrial fibrillation 05/16/2018 after he presented emergency room with palpitations.  More recently, he was having chronic back issues and was prescribed prednisone.  He went into atrial fibrillation but converted to sinus rhythm.  He does have a history of obstructive sleep apnea and is on CPAP.  Ethanol she looks today, he denies symptoms of palpitations, chest pain, shortness of breath, orthopnea, PND, lower extremity edema, claudication, dizziness, presyncope, syncope, bleeding, or neurologic sequela. The patient is tolerating medications without difficulties.  He does continue to have episodes of atrial fibrillation.  There is no exacerbating or alleviating factor.  He feels quite poorly with significant weakness and fatigue during his episodes.   Past Medical History:  Diagnosis Date  . BPH with urinary obstruction   . Erectile dysfunction   . Hepatitis B infection    resolved    Past Surgical History:  Procedure Laterality Date  . CARDIOVERSION  05/16/2018  . COLONOSCOPY  07-18-11    per Dr. Sharlett Iles, diverticulosis only, repeat in 10 yrs   . LUMBAR MICRODISCECTOMY  2002     Current Outpatient Medications  Medication Sig Dispense Refill  . apixaban (ELIQUIS) 5 MG TABS tablet Take 1 tablet (5 mg total) by mouth 2 (two) times daily. 60 tablet 3  . diltiazem (CARDIZEM CD) 120 MG 24 hr capsule Take 1 capsule (120 mg total) by  mouth daily. 30 capsule 3  . ibuprofen (ADVIL,MOTRIN) 400 MG tablet Take 400 mg by mouth as needed for mild pain.     . tadalafil (CIALIS) 20 MG tablet Take 1 tablet (20 mg total) by mouth as needed for erectile dysfunction. 10 tablet 11   No current facility-administered medications for this visit.    Allergies:   Dextromethorphan-guaifenesin, Penicillins, and Prednisone   Social History:  The patient  reports that he has quit smoking. He has never used smokeless tobacco. He reports current alcohol use of about 2.0 - 4.0 standard drinks of alcohol per week. He reports that he does not use drugs.   Family History:  The patient's family history includes Alcohol abuse in his father; Lung cancer in his father.    ROS:  Please see the history of present illness.   Otherwise, review of systems is positive for none.   All other systems are reviewed and negative.    PHYSICAL EXAM: VS:  BP 120/70   Pulse 64   Ht 5\' 10"  (1.778 m)   Wt 248 lb (112.5 kg)   SpO2 96%   BMI 35.58 kg/m  , BMI Body mass index is 35.58 kg/m. GEN: Well nourished, well developed, in no acute distress  HEENT: normal  Neck: no JVD, carotid bruits, or masses Cardiac: RRR; no murmurs, rubs, or gallops,no edema  Respiratory:  clear to auscultation bilaterally, normal work of breathing GI: soft, nontender, nondistended, + BS MS: no deformity or atrophy  Skin: warm and dry Neuro:  Strength and  sensation are intact Psych: euthymic mood, full affect  EKG:  EKG is not ordered today. Personal review of the ekg ordered 01/15/20 shows AF, rate 152  Recent Labs: No results found for requested labs within last 8760 hours.    Lipid Panel     Component Value Date/Time   CHOL 236 (H) 09/25/2017 1432   TRIG 81.0 09/25/2017 1432   HDL 88.30 09/25/2017 1432   CHOLHDL 3 09/25/2017 1432   VLDL 16.2 09/25/2017 1432   LDLCALC 131 (H) 09/25/2017 1432   LDLDIRECT 128.0 05/08/2013 0802     Wt Readings from Last 3  Encounters:  01/21/20 248 lb (112.5 kg)  01/15/20 245 lb 3.2 oz (111.2 kg)  10/05/19 247 lb 12.8 oz (112.4 kg)      Other studies Reviewed: Additional studies/ records that were reviewed today include: TTE 10/02/18  Review of the above records today demonstrates:  1. The left ventricle has normal systolic function with an ejection  fraction of 60-65%. The cavity size was normal. Left ventricular diastolic  Doppler parameters are consistent with impaired relaxation. No evidence of  left ventricular regional wall  motion abnormalities.  2. The right ventricle has normal systolic function. The cavity was  normal. There is no increase in right ventricular wall thickness.  3. No evidence of mitral valve stenosis. No significant mitral  regurgitation.  4. The aortic valve is tricuspid. Mild calcification of the aortic valve.  No stenosis of the aortic valve.  5. The aortic root is normal in size and structure.  6. The IVC is normal in size. No complete TR doppler jet so unable to  estimate PA systolic pressure.    ASSESSMENT AND PLAN:  1.  Persistent atrial fibrillation: Currently on diltiazem and Eliquis.  CHA2DS2-VASc of 0.  He has had recurrent episodes and would thus benefit from a rhythm control.  He would prefer to not be on antiarrhythmics.  We did discuss ablation.  Risks and benefits were discussed risk of bleeding, tamponade, heart block, stroke, damage to chest organs.  He understands these risks and is agreed to the procedure.  2.  Obstructive sleep apnea: Compliant with CPAP.  3.  Obesity: Diet and exercise encouraged.    Current medicines are reviewed at length with the patient today.   The patient does not have concerns regarding his medicines.  The following changes were made today:  none  Labs/ tests ordered today include:  No orders of the defined types were placed in this encounter.    Disposition:   FU with Marcos Ruelas 3 months  Signed, Dareon Nunziato Meredith Leeds, MD  01/21/2020 11:22 AM     [facility] [address] [place] [address] [phone] (office) [phone] (fax) There is a

## 2020-01-21 NOTE — Patient Instructions (Signed)
Medication Instructions:  Your physician recommends that you continue on your current medications as directed. Please refer to the Current Medication list given to you today.  *If you need a refill on your cardiac medications before your next appointment, please call your pharmacy*   Lab Work: Pre procedure labs on ___________: BMET & CBC If you have labs (blood work) drawn today and your tests are completely normal, you will receive your results only by: [name] MyChart Message (if you have MyChart) OR . A paper copy in the mail If you have any lab test that is abnormal or we need to change your treatment, we will call you to review the results.   Testing/Procedures: Your physician has requested that you have cardiac CT within 7 days PRIOR to your ablation. Cardiac computed tomography (CT) is a painless test that uses an x-ray machine to take clear, detailed pictures of your heart.  Please follow instructions below located under "other instructions".   Your physician has recommended that you have an ablation. Catheter ablation is a medical procedure used to treat some cardiac arrhythmias (irregular heartbeats). During catheter ablation, a long, thin, flexible tube is put into a blood vessel in your groin (upper thigh), or neck. This tube is called an ablation catheter. It is then guided to your heart through the blood vessel. Radio frequency waves destroy small areas of heart tissue where abnormal heartbeats may cause an arrhythmia to start. Please follow instructions below located under "other instructions".   The following dates are available (these are subject to change): 11/24, 11/26, 12/01, 12/03, 12/08, 12/15, 12/17, 12/28, 12/29   Follow-Up: At [facility], you and your health needs are our priority.  As part of our continuing mission to provide you with exceptional heart care, we have created designated Provider Care Teams.  These Care Teams include your primary Cardiologist (physician)  and Advanced Practice Providers (APPs -  Physician Assistants and Nurse Practitioners) who all work together to provide you with the care you need, when you need it.  Your next appointment:   4 week(s) after your ablation on ____________  The format for your next appointment:   In Person  Provider:   You will follow up in the [facility] located at [facility]. Your provider will be: Roderic Palau, NP or Clint R. Fenton, PA-C    Thank you for choosing [facility]!!   Trinidad Curet, RN [phone]   Other Instructions  CT INSTRUCTIONS Your cardiac CT will be scheduled at:  [facility] [address] [place], [place] [address] [phone]  Please arrive at the [facility] main entrance of [facility] 30 minutes prior to test start time on _________________ Proceed to the [facility] Radiology Department (first floor) to check-in and test prep.  Please follow these instructions carefully (unless otherwise directed):  Hold all erectile dysfunction medications at least 3 days (72 hrs) prior to test.  On the Night Before the Test: . Be sure to Drink plenty of water. . Do not consume any caffeinated/decaffeinated beverages or chocolate 12 hours prior to your test. . Do not take any antihistamines 12 hours prior to your test.  On the Day of the Test: . Drink plenty of water. Do not drink any water within one hour of the test. . Do not eat any food 4 hours prior to the test. . You may take your regular medications prior to the test.  . Take metoprolol (Lopressor) two  hours prior to test. . HOLD Furosemide/Hydrochlorothiazide morning of the test.      After the Test: . Drink plenty of water. . After receiving IV contrast, you may experience a mild flushed feeling. This is normal. . On occasion, you may experience a mild rash up to 24 hours after the test. This is not dangerous. If this occurs, you can take Benadryl 25 mg and  increase your fluid intake. . If you experience trouble breathing, this can be serious. If it is severe call 911 IMMEDIATELY. If it is mild, please call our office. . If you take any of these medications: Glipizide/Metformin, Avandament, Glucavance, please do not take 48 hours after completing test unless otherwise instructed.   Once we have confirmed authorization from your insurance company, we will call you to set up a date and time for your test. Based on how quickly your insurance processes prior authorizations requests, please allow up to 4 weeks to be contacted for scheduling your Cardiac CT appointment. Be advised that routine Cardiac CT appointments could be scheduled as many as 8 weeks after your provider has ordered it.  For non-scheduling related questions, please contact the cardiac imaging nurse navigator should you have any questions/concerns: Marchia Bond, Cardiac Imaging Nurse Navigator Burley Saver, Interim Cardiac Imaging Nurse [facility] Direct Office Dial: [phone]   For scheduling needs, including cancellations and rescheduling, please call Vivien Rota at [phone], option 3.      Electrophysiology/Ablation Procedure Instructions   You are scheduled for a(n)  ablation on ___________  with Dr. Allegra Lai.   1.   Pre procedure testing-             A.  LAB WORK --- On ___________  for your pre procedure blood work.  You do NOT need to be fasting.               B. COVID TEST-- On __________ @ __________ - This is a Drive Up Visit at [address] [place], [place] [address].  Someone will direct you to the appropriate testing line. Stay in your car and someone will be with you shortly.   After you are tested please go home and self quarantine until the day of your procedure.     2. On the day of your procedure ____________ you will go to [facility] hospital [phone] [address] [employer]) at _____________.  You will go to the main entrance A  [employer]) and enter where the [employer] parking staff are.  Your driver will drop you off and you will head down the hallway to ADMITTING.  You may have one support person come in to the hospital with you.  They will be asked to wait in the waiting room.   3.   Do not eat or drink after midnight prior to your procedure.   4.   Do not miss any doses of your blood thinner prior to the morning of your procedure or your procedure will need to be rescheduled.       Do NOT take any medications the morning of your procedure.   5.  Plan for an overnight stay, but you may be discharged home after your procedure.    If you use your phone frequently bring your phone charger, in case you have to stay.  If you are discharged after your procedure you will need someone to drive you home and be with your for 24 hours after your procedure.  6. You will follow up with the AFIB clinic 4 weeks after your procedure.  You will follow up with Dr. Curt Bears  3 months after your procedure.  These appointments will be made for you.   * If you have ANY questions please call the office [phone] and ask for Roshanna Cimino RN or send me a MyChart message   * Occasionally, EP Studies and ablations can become lengthy.  Please make your family aware of this before your procedure starts.  Average time ranges from 2-8 hours for EP studies/ablations.  Your physician will call your family after the procedure with the results.                                    Cardiac Ablation  Cardiac ablation is a procedure to stop some heart tissue from causing problems. The heart has many electrical connections. Sometimes these connections make the heart beat very fast or irregularly. Removing some problem areas can improve the heart rhythm or make it normal. What happens before the procedure?  Follow instructions from your doctor about what you cannot eat or drink.  Ask your doctor about: ? Changing or stopping your normal medicines. This is  important if you take diabetes medicines or blood thinners. ? Taking medicines such as aspirin and ibuprofen. These medicines can thin your blood. Do not take these medicines before your procedure if your doctor tells you not to.  Plan to have someone take you home.  If you will be going home right after the procedure, plan to have someone with you for 24 hours. What happens during the procedure?  To lower your risk of infection: ? Your health care team will wash or sanitize their hands. ? Your skin will be washed with soap. ? Hair may be removed from your neck or groin.  An IV tube will be put into one of your veins.  You will be given a medicine to help you relax (sedative).  Skin on your neck or groin will be numbed.  A cut (incision) will be made in your neck or groin.  A needle will be put through your cut and into a vein in your neck or groin.  A tube (catheter) will be put into the needle. The tube will be moved to your heart. X-rays (fluoroscopy) will be used to help guide the tube.  Small devices (electrodes) on the tip of the tube will send out electrical currents.  Dye may be put through the tube. This helps your surgeon see your heart.  Electrical energy will be used to scar (ablate) some heart tissue. Your surgeon may use: ? Heat (radiofrequency energy). ? Laser energy. ? Extreme cold (cryoablation).  The tube will be taken out.  Pressure will be held on your cut. This helps stop bleeding.  A bandage (dressing) will be put on your cut. The procedure may vary. What happens after the procedure?  You will be monitored until your medicines have worn off.  Your cut will be watched for bleeding. You will need to lie still for a few hours.  Do not drive for 24 hours or as long as your doctor tells you. Summary  Cardiac ablation is a procedure to stop some heart tissue from causing problems.  Electrical energy will be used to scar (ablate) some heart  tissue. This information is not intended to replace advice given to you by your  health care provider. Make sure you discuss any questions you have with your health care provider. Document Revised: 03/08/2017 Document Reviewed: 02/13/2016 Elsevier Patient Education  2020 [employer].

## 2020-01-22 ENCOUNTER — Ambulatory Visit (HOSPITAL_COMMUNITY): Payer: 59 | Admitting: Physician Assistant

## 2020-01-22 DIAGNOSIS — Z01812 Encounter for preprocedural laboratory examination: Secondary | ICD-10-CM

## 2020-01-22 DIAGNOSIS — I4819 Other persistent atrial fibrillation: Secondary | ICD-10-CM

## 2020-01-27 ENCOUNTER — Ambulatory Visit (INDEPENDENT_AMBULATORY_CARE_PROVIDER_SITE_OTHER): Payer: 59

## 2020-01-27 ENCOUNTER — Ambulatory Visit: Payer: 59 | Admitting: Podiatry

## 2020-01-27 ENCOUNTER — Other Ambulatory Visit: Payer: Self-pay

## 2020-01-27 DIAGNOSIS — L989 Disorder of the skin and subcutaneous tissue, unspecified: Secondary | ICD-10-CM | POA: Diagnosis not present

## 2020-01-27 NOTE — Progress Notes (Signed)
   Subjective: 64 y.o. male presenting to the office today as a new patient for evaluation of pain to the lateral aspect of the left foot.  Patient states he has been diagnosed in the past with peroneal tendinitis.  He had injections in the past which did not help to alleviate his symptoms.  He also notices a callus to the area and he follows done routinely.  He presents for further treatment evaluation   Past Medical History:  Diagnosis Date  . BPH with urinary obstruction   . Erectile dysfunction   . Hepatitis B infection    resolved      Objective:  Physical Exam General: Alert and oriented x3 in no acute distress  Dermatology: Hyperkeratotic lesion(s) present overlying the fifth metatarsal tubercle of the left foot. Pain on palpation with a central nucleated core noted. Skin is warm, dry and supple bilateral lower extremities. Negative for open lesions or macerations.  Vascular: Palpable pedal pulses bilaterally. No edema or erythema noted. Capillary refill within normal limits.  Neurological: Epicritic and protective threshold grossly intact bilaterally.   Musculoskeletal Exam: Pain on palpation at the keratotic lesion(s) noted. Range of motion within normal limits bilateral. Muscle strength 5/5 in all groups bilateral.  Negative pain along the peroneal tendons.  I do not suspect this is a peroneal tendinitis rather than pain coming from the porokeratosis  Assessment: 1.  Porokeratosis/keratoderma fifth metatarsal tubercle left   Plan of Care:  1. Patient evaluated 2. Excisional debridement of keratoic lesion(s) using a chisel blade was performed without incident.  Cantharone applied 3. Dressed area with light dressing. 4. 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 Edrick Kins, [place]                                        [place], [place] [address]                 Office [phone]  Fax [phone]

## 2020-02-02 ENCOUNTER — Institutional Professional Consult (permissible substitution): Payer: 59 | Admitting: Cardiology

## 2020-02-10 ENCOUNTER — Other Ambulatory Visit: Payer: Self-pay

## 2020-02-10 ENCOUNTER — Ambulatory Visit: Payer: 59 | Admitting: Podiatry

## 2020-02-10 DIAGNOSIS — L989 Disorder of the skin and subcutaneous tissue, unspecified: Secondary | ICD-10-CM

## 2020-02-10 NOTE — Progress Notes (Signed)
   Subjective: 64 y.o. male presenting to the office today for follow-up evaluation of a porokeratosis to the fifth metatarsal tubercle of the left foot.  Patient states he is feeling much better.  No new complaints at this time  Past Medical History:  Diagnosis Date  . BPH with urinary obstruction   . Erectile dysfunction   . Hepatitis B infection    resolved      Objective:  Physical Exam General: Alert and oriented x3 in no acute distress  Dermatology: Hyperkeratotic lesion(s) present overlying the fifth metatarsal tubercle of the left foot. Pain on palpation with a central nucleated core noted. Skin is warm, dry and supple bilateral lower extremities. Negative for open lesions or macerations.  Vascular: Palpable pedal pulses bilaterally. No edema or erythema noted. Capillary refill within normal limits.  Neurological: Epicritic and protective threshold grossly intact bilaterally.   Musculoskeletal Exam: Today there is negative pain on palpation at the area of the keratotic lesion(s) noted. Range of motion within normal limits bilateral. Muscle strength 5/5 in all groups bilateral.  Negative pain along the peroneal tendons.  I do not suspect this is a peroneal tendinitis rather than pain coming from the porokeratosis  Assessment: 1.  Porokeratosis/keratoderma fifth metatarsal tubercle left   Plan of Care:  1. Patient evaluated 2. Excisional debridement of keratoic lesion(s) using a chisel blade was performed without incident.  Recommend OTC corn and callus remover x 1 week.  3. Dressed area with light dressing. 4. Patient is to return to the clinic PRN.   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 Edrick Kins, [place]                                        [place], [place] [address]                Office [phone]  Fax [phone]

## 2020-02-12 ENCOUNTER — Telehealth: Payer: Self-pay | Admitting: *Deleted

## 2020-02-12 NOTE — Telephone Encounter (Signed)
Left message to call back (to review CT & procedure instructions and schedule needed pre op testing)

## 2020-02-12 NOTE — Telephone Encounter (Signed)
Pt will stop by the office on 11/15 for pre procedure lab Virtual phone call scheduled for 11/18 Covid screening scheduled for 11/24 Aware I will send CT & procedure instructions via Mychart. Advised to call with any questions after reviewing instructions. Patient verbalized understanding and agreeable to plan.

## 2020-02-15 ENCOUNTER — Encounter: Payer: Self-pay | Admitting: *Deleted

## 2020-02-22 ENCOUNTER — Other Ambulatory Visit: Payer: Self-pay

## 2020-02-22 ENCOUNTER — Other Ambulatory Visit: Payer: 59

## 2020-02-22 DIAGNOSIS — Z01812 Encounter for preprocedural laboratory examination: Secondary | ICD-10-CM

## 2020-02-22 DIAGNOSIS — I4819 Other persistent atrial fibrillation: Secondary | ICD-10-CM

## 2020-02-23 LAB — CBC
Hematocrit: 45.4 % (ref 37.5–51.0)
Hemoglobin: 15.2 g/dL (ref 13.0–17.7)
MCH: 30 pg (ref 26.6–33.0)
MCHC: 33.5 g/dL (ref 31.5–35.7)
MCV: 90 fL (ref 79–97)
Platelets: 193 10*3/uL (ref 150–450)
RBC: 5.06 x10E6/uL (ref 4.14–5.80)
RDW: 13.1 % (ref 11.6–15.4)
WBC: 5.6 10*3/uL (ref 3.4–10.8)

## 2020-02-23 LAB — BASIC METABOLIC PANEL
BUN/Creatinine Ratio: 27 — ABNORMAL HIGH (ref 10–24)
BUN: 28 mg/dL — ABNORMAL HIGH (ref 8–27)
CO2: 21 mmol/L (ref 20–29)
Calcium: 9.6 mg/dL (ref 8.6–10.2)
Chloride: 105 mmol/L (ref 96–106)
Creatinine, Ser: 1.05 mg/dL (ref 0.76–1.27)
GFR calc Af Amer: 86 mL/min/{1.73_m2} (ref >59)
GFR calc non Af Amer: 75 mL/min/{1.73_m2} (ref >59)
Glucose: 99 mg/dL (ref 65–99)
Potassium: 4.7 mmol/L (ref 3.5–5.2)
Sodium: 140 mmol/L (ref 134–144)

## 2020-02-24 NOTE — Telephone Encounter (Signed)
  Patient Consent for Virtual Visit         Michael Garner has provided verbal consent on 02/24/2020 for a virtual visit (video or telephone).   CONSENT FOR VIRTUAL VISIT FOR:  Michael Garner  By participating in this virtual visit I agree to the following:  I hereby voluntarily request, consent and authorize [facility] and its employed or contracted physicians, physician assistants, nurse practitioners or other licensed health care professionals (the Practitioner), to provide me with telemedicine health care services (the "Services") as deemed necessary by the treating Practitioner. I acknowledge and consent to receive the Services by the Practitioner via telemedicine. I understand that the telemedicine visit will involve communicating with the Practitioner through live audiovisual communication technology and the disclosure of certain medical information by electronic transmission. I acknowledge that I have been given the opportunity to request an in-person assessment or other available alternative prior to the telemedicine visit and am voluntarily participating in the telemedicine visit.  I understand that I have the right to withhold or withdraw my consent to the use of telemedicine in the course of my care at any time, without affecting my right to future care or treatment, and that the Practitioner or I may terminate the telemedicine visit at any time. I understand that I have the right to inspect all information obtained and/or recorded in the course of the telemedicine visit and may receive copies of available information for a reasonable fee.  I understand that some of the potential risks of receiving the Services via telemedicine include:  [name] Delay or interruption in medical evaluation due to technological equipment failure or disruption; . Information transmitted may not be sufficient (e.g. poor resolution of images) to allow for appropriate medical decision making by the Practitioner; and/or   . In rare instances, security protocols could fail, causing a breach of personal health information.  Furthermore, I acknowledge that it is my responsibility to provide information about my medical history, conditions and care that is complete and accurate to the best of my ability. I acknowledge that Practitioner's advice, recommendations, and/or decision may be based on factors not within their control, such as incomplete or inaccurate data provided by me or distortions of diagnostic images or specimens that may result from electronic transmissions. I understand that the practice of medicine is not an exact science and that Practitioner makes no warranties or guarantees regarding treatment outcomes. I acknowledge that a copy of this consent can be made available to me via my patient portal ([facility]), or I can request a printed copy by calling the office of [facility].    I understand that my insurance will be billed for this visit.   I have read or had this consent read to me. . I understand the contents of this consent, which adequately explains the benefits and risks of the Services being provided via telemedicine.  . I have been provided ample opportunity to ask questions regarding this consent and the Services and have had my questions answered to my satisfaction. . I give my informed consent for the services to be provided through the use of telemedicine in my medical care

## 2020-02-25 ENCOUNTER — Other Ambulatory Visit: Payer: Self-pay

## 2020-02-25 ENCOUNTER — Telehealth (HOSPITAL_COMMUNITY): Payer: Self-pay | Admitting: Emergency Medicine

## 2020-02-25 ENCOUNTER — Encounter: Payer: Self-pay | Admitting: Cardiology

## 2020-02-25 ENCOUNTER — Telehealth (INDEPENDENT_AMBULATORY_CARE_PROVIDER_SITE_OTHER): Payer: 59 | Admitting: Cardiology

## 2020-02-25 VITALS — HR 102

## 2020-02-25 DIAGNOSIS — I4819 Other persistent atrial fibrillation: Secondary | ICD-10-CM

## 2020-02-25 NOTE — Progress Notes (Signed)
Electrophysiology TeleHealth Note   Due to national recommendations of social distancing due to COVID 19, an audio/video telehealth visit is felt to be most appropriate for this patient at this time.  See Epic message for the patient's consent to telehealth for [facility].   Date:  02/25/2020   ID:  Michael Garner, DOB 03-12-1956, MRN [id number]  Location: patient's home  Provider location: [address], [place]  Evaluation Performed: Follow-up visit  PCP:  Laurey Morale, MD  Cardiologist:  No primary care provider on file.  Electrophysiologist:  Dr Curt Bears  Chief Complaint:  AF  History of Present Illness:    Michael Garner is a 64 y.o. male who presents via audio/video conferencing for a telehealth visit today.  Since last being seen in our clinic, the patient reports doing very well.  Today, he denies symptoms of palpitations, chest pain, shortness of breath,  lower extremity edema, dizziness, presyncope, or syncope.  The patient is otherwise without complaint today.  The patient denies symptoms of fevers, chills, cough, or new SOB worrisome for COVID 19.  He has a history of paroxysmal atrial fibrillation and obstructive sleep apnea. Atrial fibrillation was diagnosed 05/16/2018 when he presented to the emergency room with palpitations. He has a plan for AF ablation 03/04/2020.  Today, denies symptoms of palpitations, chest pain, shortness of breath, orthopnea, PND, lower extremity edema, claudication, dizziness, presyncope, syncope, bleeding, or neurologic sequela. The patient is tolerating medications without difficulties.    Past Medical History:  Diagnosis Date  . BPH with urinary obstruction   . Erectile dysfunction   . Hepatitis B infection    resolved     Past Surgical History:  Procedure Laterality Date  . CARDIOVERSION  05/16/2018  . COLONOSCOPY  07-18-11    per Dr. Sharlett Iles, diverticulosis only, repeat in 10 yrs   . LUMBAR MICRODISCECTOMY  2002     Current Outpatient Medications  Medication Sig Dispense Refill  . apixaban (ELIQUIS) 5 MG TABS tablet Take 1 tablet (5 mg total) by mouth 2 (two) times daily. 60 tablet 3  . diltiazem (CARDIZEM CD) 120 MG 24 hr capsule Take 1 capsule (120 mg total) by mouth daily. 30 capsule 3  . ibuprofen (ADVIL,MOTRIN) 400 MG tablet Take 400 mg by mouth as needed for mild pain.     . tadalafil (CIALIS) 20 MG tablet Take 1 tablet (20 mg total) by mouth as needed for erectile dysfunction. 10 tablet 11   No current facility-administered medications for this visit.    Allergies:   Dextromethorphan-guaifenesin, Penicillins, and Prednisone   Social History:  The patient  reports that he has quit smoking. He has never used smokeless tobacco. He reports current alcohol use of about 2.0 - 4.0 standard drinks of alcohol per week. He reports that he does not use drugs.   Family History:  The patient's family history includes Alcohol abuse in his father; Lung cancer in his father.   ROS:  Please see the history of present illness.   All other systems are personally reviewed and negative.   Exam:    Vital Signs:  Pulse (!) 102   no acute distress, no shortness of breath.  Labs/Other Tests and Data Reviewed:    Recent Labs: 02/22/2020: BUN 28; Creatinine, Ser 1.05; Hemoglobin 15.2; Platelets 193; Potassium 4.7; Sodium 140   Wt Readings from Last 3 Encounters:  01/21/20 248 lb (112.5 kg)  01/15/20 245 lb 3.2 oz (111.2 kg)  10/05/19  247 lb 12.8 oz (112.4 kg)     Other studies personally reviewed: Additional studies/ records that were reviewed today include: ECG 01/15/20  Review of the above records today demonstrates:  AF, rate 152  ASSESSMENT & PLAN:    1. Persistent atrial fibrillation: Currently on Eliquis and diltiazem. CHA2DS2-VASc of zero. He would benefit from rhythm control as he is quite symptomatic. Has plans for ablation 03/04/2020. Risks and benefits of been discussed and include bleeding,  tamponade, heart block, stroke, damage to chest organs. He understands his risks and is agreed to the procedure.  2. Obstructive sleep apnea: CPAP compliance encouraged   COVID 19 screen The patient denies symptoms of COVID 19 at this time.  The importance of social distancing was discussed today.  Follow-up: 3 months   Current medicines are reviewed at length with the patient today.   The patient does not have concerns regarding his medicines.  The following changes were made today:  none  Labs/ tests ordered today include:  No orders of the defined types were placed in this encounter.    Patient Risk:  after full review of this patients clinical status, I feel that they are at moderate risk at this time.  Today, I have spent 10 minutes with the patient with telehealth technology discussing atrial fibrillation.    Signed, Selby Foisy Meredith Leeds, MD  02/25/2020 9:51 AM     [facility] [address] [place] [address] [phone] (office) [phone] (fax) short

## 2020-02-25 NOTE — Telephone Encounter (Signed)
Attempted to call patient regarding upcoming cardiac CT appointment. °Left message on voicemail with name and callback number °Olajuwon Fosdick RN Navigator Cardiac Imaging °[facility] °[phone] Office °[phone] Cell ° °

## 2020-02-25 NOTE — H&P (View-Only) (Signed)
Electrophysiology TeleHealth Note   Due to national recommendations of social distancing due to COVID 19, an audio/video telehealth visit is felt to be most appropriate for this patient at this time.  See Epic message for the patient's consent to telehealth for [facility].   Date:  02/25/2020   ID:  Michael Garner, DOB 21-Apr-1955, MRN [id number]  Location: patient's home  Provider location: [address], [place] [employer] [place]  Evaluation Performed: Follow-up visit  PCP:  Laurey Morale, MD  Cardiologist:  No primary care provider on file.  Electrophysiologist:  Dr Curt Bears  Chief Complaint:  AF  History of Present Illness:    Michael Garner is a 64 y.o. male who presents via audio/video conferencing for a telehealth visit today.  Since last being seen in our clinic, the patient reports doing very well.  Today, he denies symptoms of palpitations, chest pain, shortness of breath,  lower extremity edema, dizziness, presyncope, or syncope.  The patient is otherwise without complaint today.  The patient denies symptoms of fevers, chills, cough, or new SOB worrisome for COVID 19.  He has a history of paroxysmal atrial fibrillation and obstructive sleep apnea. Atrial fibrillation was diagnosed 05/16/2018 when he presented to the emergency room with palpitations. He has a plan for AF ablation 03/04/2020.  Today, denies symptoms of palpitations, chest pain, shortness of breath, orthopnea, PND, lower extremity edema, claudication, dizziness, presyncope, syncope, bleeding, or neurologic sequela. The patient is tolerating medications without difficulties.    Past Medical History:  Diagnosis Date  . BPH with urinary obstruction   . Erectile dysfunction   . Hepatitis B infection    resolved     Past Surgical History:  Procedure Laterality Date  . CARDIOVERSION  05/16/2018  . COLONOSCOPY  07-18-11    per Dr. Sharlett Iles, diverticulosis only, repeat in 10 yrs   . LUMBAR MICRODISCECTOMY  2002     Current Outpatient Medications  Medication Sig Dispense Refill  . apixaban (ELIQUIS) 5 MG TABS tablet Take 1 tablet (5 mg total) by mouth 2 (two) times daily. 60 tablet 3  . diltiazem (CARDIZEM CD) 120 MG 24 hr capsule Take 1 capsule (120 mg total) by mouth daily. 30 capsule 3  . ibuprofen (ADVIL,MOTRIN) 400 MG tablet Take 400 mg by mouth as needed for mild pain.     . tadalafil (CIALIS) 20 MG tablet Take 1 tablet (20 mg total) by mouth as needed for erectile dysfunction. 10 tablet 11   No current facility-administered medications for this visit.    Allergies:   Dextromethorphan-guaifenesin, Penicillins, and Prednisone   Social History:  The patient  reports that he has quit smoking. He has never used smokeless tobacco. He reports current alcohol use of about 2.0 - 4.0 standard drinks of alcohol per week. He reports that he does not use drugs.   Family History:  The patient's family history includes Alcohol abuse in his father; Lung cancer in his father.   ROS:  Please see the history of present illness.   All other systems are personally reviewed and negative.   Exam:    Vital Signs:  Pulse (!) 102   no acute distress, no shortness of breath.  Labs/Other Tests and Data Reviewed:    Recent Labs: 02/22/2020: BUN 28; Creatinine, Ser 1.05; Hemoglobin 15.2; Platelets 193; Potassium 4.7; Sodium 140   Wt Readings from Last 3 Encounters:  01/21/20 248 lb (112.5 kg)  01/15/20 245 lb 3.2 oz (111.2 kg)  10/05/19  247 lb 12.8 oz (112.4 kg)     Other studies personally reviewed: Additional studies/ records that were reviewed today include: ECG 01/15/20  Review of the above records today demonstrates:  AF, rate 152  ASSESSMENT & PLAN:    1. Persistent atrial fibrillation: Currently on Eliquis and diltiazem. CHA2DS2-VASc of zero. He would benefit from rhythm control as he is quite symptomatic. Has plans for ablation 03/04/2020. Risks and benefits of been discussed and include bleeding,  tamponade, heart block, stroke, damage to chest organs. He understands his risks and is agreed to the procedure.  2. Obstructive sleep apnea: CPAP compliance encouraged   COVID 19 screen The patient denies symptoms of COVID 19 at this time.  The importance of social distancing was discussed today.  Follow-up: 3 months   Current medicines are reviewed at length with the patient today.   The patient does not have concerns regarding his medicines.  The following changes were made today:  none  Labs/ tests ordered today include:  No orders of the defined types were placed in this encounter.    Patient Risk:  after full review of this patients clinical status, I feel that they are at moderate risk at this time.  Today, I have spent 10 minutes with the patient with telehealth technology discussing atrial fibrillation.    Signed, Corneluis Allston Meredith Leeds, MD  02/25/2020 9:51 AM     [facility] [address] [place] [address] [phone] (office) [phone] (fax) short

## 2020-02-26 ENCOUNTER — Other Ambulatory Visit: Payer: Self-pay

## 2020-02-26 ENCOUNTER — Ambulatory Visit (HOSPITAL_COMMUNITY)
Admission: RE | Admit: 2020-02-26 | Discharge: 2020-02-26 | Disposition: A | Discharge status: Home / Self Care | Payer: 59 | Source: Ambulatory Visit | Attending: Cardiology | Admitting: Cardiology

## 2020-02-26 ENCOUNTER — Encounter (HOSPITAL_COMMUNITY): Payer: Self-pay

## 2020-02-26 DIAGNOSIS — I4819 Other persistent atrial fibrillation: Secondary | ICD-10-CM | POA: Diagnosis not present

## 2020-02-26 MED ORDER — METOPROLOL TARTRATE 5 MG/5ML IV SOLN: 5.0000 mg | Freq: Once | INTRAVENOUS | Status: AC

## 2020-02-26 MED ORDER — METOPROLOL TARTRATE 5 MG/5ML IV SOLN
INTRAVENOUS | Status: AC
  Administered 2020-02-26: 5 mg via INTRAVENOUS
  Filled 2020-02-26: qty 5

## 2020-02-26 MED ORDER — METOPROLOL TARTRATE 5 MG/5ML IV SOLN
5.0000 mg | Freq: Once | INTRAVENOUS | Status: AC
  Administered 2020-02-26: 5 mg via INTRAVENOUS

## 2020-02-26 MED ORDER — METOPROLOL TARTRATE 5 MG/5ML IV SOLN
INTRAVENOUS | Status: AC
  Filled 2020-02-26: qty 5

## 2020-02-26 MED ORDER — IOHEXOL 350 MG/ML SOLN
80.0000 mL | Freq: Once | INTRAVENOUS | Status: AC | PRN
  Administered 2020-02-26: 80 mL via INTRAVENOUS

## 2020-02-26 NOTE — Progress Notes (Signed)
Notified Dr Marisue Ivan that patient HR 120-160's.  Received verbal order to admin 5mg  IV metoprolol x1 now an d notify Dr Marisue Ivan with the results

## 2020-03-02 ENCOUNTER — Other Ambulatory Visit (HOSPITAL_COMMUNITY)
Admission: RE | Admit: 2020-03-02 | Discharge: 2020-03-02 | Disposition: A | Discharge status: Home / Self Care | Payer: 59 | Source: Ambulatory Visit | Attending: Cardiology | Admitting: Cardiology

## 2020-03-02 DIAGNOSIS — Z01812 Encounter for preprocedural laboratory examination: Secondary | ICD-10-CM | POA: Insufficient documentation

## 2020-03-02 DIAGNOSIS — Z20822 Contact with and (suspected) exposure to covid-19: Secondary | ICD-10-CM | POA: Diagnosis not present

## 2020-03-02 LAB — SARS CORONAVIRUS 2 (TAT 6-24 HRS): SARS Coronavirus 2: NEGATIVE

## 2020-03-02 NOTE — Progress Notes (Signed)
Attempted to call patient regarding instructions for procedure for tomorrow.  Left voicemail on the following items: Arrival time 0530 Nothing to eat or drink after midnight No meds AM of procedure Responsible person to drive you home and stay with you for 24 hrs  Have you missed any doses of anti-coagulant Eliquis

## 2020-03-03 NOTE — Anesthesia Preprocedure Evaluation (Addendum)
Anesthesia Evaluation  Patient identified by MRN, date of birth, ID band Patient awake    Reviewed: Allergy & Precautions, NPO status , Patient's Chart, lab work & pertinent test results  Airway Mallampati: II  TM Distance: >3 FB Neck ROM: Full    Dental no notable dental hx.    Pulmonary former smoker,    Pulmonary exam normal breath sounds clear to auscultation       Cardiovascular + dysrhythmias Atrial Fibrillation  Rhythm:Irregular Rate:Tachycardia  ECG: a-fib with RVR, rate 152   Neuro/Psych PSYCHIATRIC DISORDERS Depression negative neurological ROS     GI/Hepatic negative GI ROS, (+) Hepatitis -  Endo/Other  negative endocrine ROS  Renal/GU negative Renal ROS     Musculoskeletal  (+) Arthritis ,   Abdominal (+) + obese,   Peds  Hematology negative hematology ROS (+)   Anesthesia Other Findings Atrial fibrillation with RVR  Reproductive/Obstetrics                           Anesthesia Physical Anesthesia Plan  ASA: IV  Anesthesia Plan: General   Post-op Pain Management:    Induction: Intravenous  PONV Risk Score and Plan: 2 and Ondansetron, Dexamethasone, Midazolam and Treatment may vary due to age or medical condition  Airway Management Planned: Oral ETT  Additional Equipment:   Intra-op Plan:   Post-operative Plan: Extubation in OR  Informed Consent: I have reviewed the patients History and Physical, chart, labs and discussed the procedure including the risks, benefits and alternatives for the proposed anesthesia with the patient or authorized representative who has indicated his/her understanding and acceptance.     Dental advisory given  Plan Discussed with: CRNA  Anesthesia Plan Comments:       Anesthesia Quick Evaluation

## 2020-03-04 ENCOUNTER — Ambulatory Visit (HOSPITAL_COMMUNITY): Payer: 59 | Admitting: Anesthesiology

## 2020-03-04 ENCOUNTER — Other Ambulatory Visit: Payer: Self-pay

## 2020-03-04 ENCOUNTER — Encounter (HOSPITAL_COMMUNITY): Payer: Self-pay | Admitting: Cardiology

## 2020-03-04 ENCOUNTER — Ambulatory Visit (HOSPITAL_COMMUNITY)
Admission: RE | Admit: 2020-03-04 | Discharge: 2020-03-04 | Disposition: A | Discharge status: Home / Self Care | Payer: 59 | Source: Home / Self Care | Attending: Cardiology | Admitting: Cardiology

## 2020-03-04 ENCOUNTER — Encounter (HOSPITAL_COMMUNITY)
Admission: RE | Disposition: A | Discharge status: Home / Self Care | Payer: 59 | Source: Home / Self Care | Attending: Cardiology

## 2020-03-04 DIAGNOSIS — Z7901 Long term (current) use of anticoagulants: Secondary | ICD-10-CM | POA: Insufficient documentation

## 2020-03-04 DIAGNOSIS — Z79899 Other long term (current) drug therapy: Secondary | ICD-10-CM | POA: Insufficient documentation

## 2020-03-04 DIAGNOSIS — Z888 Allergy status to other drugs, medicaments and biological substances status: Secondary | ICD-10-CM | POA: Insufficient documentation

## 2020-03-04 DIAGNOSIS — I4819 Other persistent atrial fibrillation: Secondary | ICD-10-CM | POA: Insufficient documentation

## 2020-03-04 DIAGNOSIS — J810 Acute pulmonary edema: Secondary | ICD-10-CM | POA: Diagnosis not present

## 2020-03-04 DIAGNOSIS — Z87891 Personal history of nicotine dependence: Secondary | ICD-10-CM | POA: Insufficient documentation

## 2020-03-04 DIAGNOSIS — Z880 Allergy status to penicillin: Secondary | ICD-10-CM | POA: Insufficient documentation

## 2020-03-04 DIAGNOSIS — G4733 Obstructive sleep apnea (adult) (pediatric): Secondary | ICD-10-CM | POA: Insufficient documentation

## 2020-03-04 DIAGNOSIS — R0602 Shortness of breath: Secondary | ICD-10-CM | POA: Diagnosis not present

## 2020-03-04 HISTORY — PX: ATRIAL FIBRILLATION ABLATION: EP1191

## 2020-03-04 LAB — POCT ACTIVATED CLOTTING TIME
Activated Clotting Time: 257 seconds
Activated Clotting Time: 279 seconds
Activated Clotting Time: 301 seconds
Activated Clotting Time: 318 seconds

## 2020-03-04 SURGERY — ATRIAL FIBRILLATION ABLATION
Anesthesia: General

## 2020-03-04 MED ORDER — ROCURONIUM BROMIDE 10 MG/ML (PF) SYRINGE
PREFILLED_SYRINGE | INTRAVENOUS | Status: DC | PRN
  Administered 2020-03-04: 40 mg via INTRAVENOUS
  Administered 2020-03-04: 20 mg via INTRAVENOUS
  Administered 2020-03-04: 60 mg via INTRAVENOUS
  Administered 2020-03-04: 30 mg via INTRAVENOUS

## 2020-03-04 MED ORDER — PROPOFOL 10 MG/ML IV BOLUS
INTRAVENOUS | Status: DC | PRN
  Administered 2020-03-04: 150 mg via INTRAVENOUS

## 2020-03-04 MED ORDER — SODIUM CHLORIDE 0.9 % IV SOLN: 250.0000 mL | INTRAVENOUS | Status: DC | PRN

## 2020-03-04 MED ORDER — PROTAMINE SULFATE 10 MG/ML IV SOLN
INTRAVENOUS | Status: DC | PRN
  Administered 2020-03-04: 40 mg via INTRAVENOUS

## 2020-03-04 MED ORDER — LIDOCAINE 2% (20 MG/ML) 5 ML SYRINGE
INTRAMUSCULAR | Status: DC | PRN
  Administered 2020-03-04: 60 mg via INTRAVENOUS

## 2020-03-04 MED ORDER — ACETAMINOPHEN 500 MG PO TABS
1000.0000 mg | ORAL_TABLET | Freq: Once | ORAL | Status: AC
  Administered 2020-03-04: 1000 mg via ORAL
  Filled 2020-03-04 (×2): qty 2

## 2020-03-04 MED ORDER — ONDANSETRON HCL 4 MG/2ML IJ SOLN: 4.0000 mg | Freq: Four times a day (QID) | INTRAMUSCULAR | Status: DC | PRN

## 2020-03-04 MED ORDER — HEPARIN SODIUM (PORCINE) 1000 UNIT/ML IJ SOLN
INTRAMUSCULAR | Status: DC | PRN
  Administered 2020-03-04: 1000 [IU] via INTRAVENOUS

## 2020-03-04 MED ORDER — FENTANYL CITRATE (PF) 100 MCG/2ML IJ SOLN
INTRAMUSCULAR | Status: DC | PRN
Start: 2020-03-04 — End: 2020-03-04
  Administered 2020-03-04: 100 ug via INTRAVENOUS

## 2020-03-04 MED ORDER — HEPARIN SODIUM (PORCINE) 1000 UNIT/ML IJ SOLN
INTRAMUSCULAR | Status: DC | PRN
  Administered 2020-03-04: 7000 [IU] via INTRAVENOUS
  Administered 2020-03-04: 6000 [IU] via INTRAVENOUS
  Administered 2020-03-04: 3000 [IU] via INTRAVENOUS
  Administered 2020-03-04: 4000 [IU] via INTRAVENOUS
  Administered 2020-03-04: 15000 [IU] via INTRAVENOUS

## 2020-03-04 MED ORDER — SODIUM CHLORIDE 0.9% FLUSH: 3.0000 mL | INTRAVENOUS | Status: DC | PRN

## 2020-03-04 MED ORDER — ONDANSETRON HCL 4 MG/2ML IJ SOLN
INTRAMUSCULAR | Status: DC | PRN
  Administered 2020-03-04: 4 mg via INTRAVENOUS

## 2020-03-04 MED ORDER — HEPARIN SODIUM (PORCINE) 1000 UNIT/ML IJ SOLN
INTRAMUSCULAR | Status: AC
  Filled 2020-03-04: qty 1

## 2020-03-04 MED ORDER — HEPARIN (PORCINE) IN NACL 1000-0.9 UT/500ML-% IV SOLN
INTRAVENOUS | Status: AC
  Filled 2020-03-04: qty 2500

## 2020-03-04 MED ORDER — MIDAZOLAM HCL 2 MG/2ML IJ SOLN
INTRAMUSCULAR | Status: DC | PRN
  Administered 2020-03-04: 2 mg via INTRAVENOUS

## 2020-03-04 MED ORDER — SODIUM CHLORIDE 0.9 % IV SOLN: INTRAVENOUS | Status: DC

## 2020-03-04 MED ORDER — ACETAMINOPHEN 325 MG PO TABS
650.0000 mg | ORAL_TABLET | ORAL | Status: DC | PRN
  Administered 2020-03-04: 650 mg via ORAL

## 2020-03-04 MED ORDER — PHENYLEPHRINE HCL-NACL 10-0.9 MG/250ML-% IV SOLN
INTRAVENOUS | Status: DC | PRN
  Administered 2020-03-04: 25 ug/min via INTRAVENOUS

## 2020-03-04 MED ORDER — ACETAMINOPHEN 325 MG PO TABS
ORAL_TABLET | ORAL | Status: AC
  Filled 2020-03-04: qty 2

## 2020-03-04 MED ORDER — SUGAMMADEX SODIUM 200 MG/2ML IV SOLN
INTRAVENOUS | Status: DC | PRN
  Administered 2020-03-04: 200 mg via INTRAVENOUS

## 2020-03-04 MED ORDER — ESMOLOL HCL 100 MG/10ML IV SOLN
INTRAVENOUS | Status: DC | PRN
Start: 2020-03-04 — End: 2020-03-04
  Administered 2020-03-04 (×2): 30 mg via INTRAVENOUS
  Administered 2020-03-04: 20 mg via INTRAVENOUS

## 2020-03-04 MED ORDER — HEPARIN (PORCINE) IN NACL 1000-0.9 UT/500ML-% IV SOLN
INTRAVENOUS | Status: DC | PRN
  Administered 2020-03-04 (×5): 500 mL

## 2020-03-04 MED ORDER — SODIUM CHLORIDE 0.9% FLUSH: 3.0000 mL | Freq: Two times a day (BID) | INTRAVENOUS | Status: DC

## 2020-03-04 MED ORDER — PHENYLEPHRINE 40 MCG/ML (10ML) SYRINGE FOR IV PUSH (FOR BLOOD PRESSURE SUPPORT)
PREFILLED_SYRINGE | INTRAVENOUS | Status: DC | PRN
  Administered 2020-03-04 (×3): 80 ug via INTRAVENOUS

## 2020-03-04 SURGICAL SUPPLY — 20 items
BAG SNAP BAND KOVER 36X36 (MISCELLANEOUS) ×2 IMPLANT
BLANKET WARM UNDERBOD FULL ACC (MISCELLANEOUS) ×2 IMPLANT
CATH 8FR REPROCESSED SOUNDSTAR (CATHETERS) ×2 IMPLANT
CATH MAPPNG PENTARAY F 2-6-2MM (CATHETERS) ×1 IMPLANT
CATH S CIRCA THERM PROBE 10F (CATHETERS) ×2 IMPLANT
CATH SMTCH THERMOCOOL SF DF (CATHETERS) ×2 IMPLANT
CATH WEBSTER BI DIR CS D-F CRV (CATHETERS) ×2 IMPLANT
CLOSURE PERCLOSE PROSTYLE (VASCULAR PRODUCTS) ×8 IMPLANT
COVER SWIFTLINK CONNECTOR (BAG) ×2 IMPLANT
KIT VERSACROSS STEERABLE D1 (CATHETERS) ×2 IMPLANT
PACK EP LATEX FREE (CUSTOM PROCEDURE TRAY) ×2
PACK EP LF (CUSTOM PROCEDURE TRAY) ×1 IMPLANT
PAD PRO RADIOLUCENT 2001M-C (PAD) ×2 IMPLANT
PATCH CARTO3 (PAD) ×2 IMPLANT
PENTARAY F 2-6-2MM (CATHETERS) ×2
SHEATH CARTO VIZIGO SM CVD (SHEATH) ×4 IMPLANT
SHEATH PINNACLE 7F 10CM (SHEATH) ×2 IMPLANT
SHEATH PINNACLE 8F 10CM (SHEATH) ×4 IMPLANT
SHEATH PINNACLE 9F 10CM (SHEATH) ×2 IMPLANT
TUBING SMART ABLATE COOLFLOW (TUBING) ×2 IMPLANT

## 2020-03-04 NOTE — Transfer of Care (Signed)
Immediate Anesthesia Transfer of Care Note  Patient: Michael Garner  Procedure(s) Performed: ATRIAL FIBRILLATION ABLATION (N/A )  Patient Location: PACU  Anesthesia Type:General  Level of Consciousness: awake, alert  and oriented  Airway & Oxygen Therapy: Patient Spontanous Breathing and Patient connected to nasal cannula oxygen  Post-op Assessment: Report given to RN  Post vital signs: Reviewed and stable  Last Vitals:  Vitals Value Taken Time  BP    Temp    Pulse    Resp    SpO2      Last Pain:  Vitals:   03/04/20 0605  TempSrc:   PainSc: 0-No pain         Complications: No complications documented.

## 2020-03-04 NOTE — Progress Notes (Signed)
Up and walked and tolerated well; bilat groins stable, no bleeding or hematoma 

## 2020-03-04 NOTE — Anesthesia Procedure Notes (Addendum)
Procedure Name: Intubation Date/Time: 03/04/2020 7:44 AM Performed by: Barrington Ellison, CRNA Pre-anesthesia Checklist: Patient identified, Emergency Drugs available, Suction available and Patient being monitored Patient Re-evaluated:Patient Re-evaluated prior to induction Oxygen Delivery Method: Circle System Utilized Preoxygenation: Pre-oxygenation with 100% oxygen Induction Type: IV induction Ventilation: Mask ventilation without difficulty and Two handed mask ventilation required Laryngoscope Size: Mac and 4 Grade View: Grade I Tube type: Oral Number of attempts: 1 Airway Equipment and Method: Stylet and Oral airway Placement Confirmation: ETT inserted through vocal cords under direct vision,  positive ETCO2 and breath sounds checked- equal and bilateral Secured at: 23 cm Tube secured with: Tape Dental Injury: Teeth and Oropharynx as per pre-operative assessment

## 2020-03-04 NOTE — Anesthesia Postprocedure Evaluation (Signed)
Anesthesia Post Note  Patient: Michael Garner  Procedure(s) Performed: ATRIAL FIBRILLATION ABLATION (N/A )     Patient location during evaluation: PACU Anesthesia Type: General Level of consciousness: awake and alert Pain management: pain level controlled Vital Signs Assessment: post-procedure vital signs reviewed and stable Respiratory status: spontaneous breathing, nonlabored ventilation, respiratory function stable and patient connected to nasal cannula oxygen Cardiovascular status: blood pressure returned to baseline and stable Postop Assessment: no apparent nausea or vomiting Anesthetic complications: no   No complications documented.  Last Vitals:  Vitals:   03/04/20 1323 03/04/20 1338  BP: (!) 131/58 128/69  Pulse: 72 74  Resp: 20 16  Temp:    SpO2: 100% 98%    Last Pain:  Vitals:   03/04/20 1150  TempSrc: Temporal  PainSc:                  Shigeo Baugh DAVID

## 2020-03-04 NOTE — Discharge Instructions (Addendum)
Femoral Site Care This sheet gives you information about how to care for yourself after your procedure. Your health care provider may also give you more specific instructions. If you have problems or questions, contact your health care provider. What can I expect after the procedure? After the procedure, it is common to have:  Bruising that usually fades within 1-2 weeks.  Tenderness at the site. Follow these instructions at home: Wound care  Follow instructions from your health care provider about how to take care of your insertion site. Make sure you: ? Wash your hands with soap and water before you change your bandage (dressing). If soap and water are not available, use hand sanitizer. ? Change your dressing as told by your health care provider. ? Leave stitches (sutures), skin glue, or adhesive strips in place. These skin closures may need to stay in place for 2 weeks or longer. If adhesive strip edges start to loosen and curl up, you may trim the loose edges. Do not remove adhesive strips completely unless your health care provider tells you to do that.  Do not take baths, swim, or use a hot tub until your health care provider approves.  You may shower 24-48 hours after the procedure or as told by your health care provider. ? Gently wash the site with plain soap and water. ? Pat the area dry with a clean towel. ? Do not rub the site. This may cause bleeding.  Do not apply powder or lotion to the site. Keep the site clean and dry.  Check your femoral site every day for signs of infection. Check for: ? Redness, swelling, or pain. ? Fluid or blood. ? Warmth. ? Pus or a bad smell. Activity  For the first 2-3 days after your procedure, or as long as directed: ? Avoid climbing stairs as much as possible. ? Do not squat.  Do not lift anything that is heavier than 10 lb (4.5 kg), or the limit that you are told, until your health care provider says that it is safe.  Rest as  directed. ? Avoid sitting for a long time without moving. Get up to take short walks every 1-2 hours.  Do not drive for 24 hours if you were given a medicine to help you relax (sedative). General instructions  Take over-the-counter and prescription medicines only as told by your health care provider.  Keep all follow-up visits as told by your health care provider. This is important. Contact a health care provider if you have:  A fever or chills.  You have redness, swelling, or pain around your insertion site. Get help right away if:  The catheter insertion area swells very fast.  You pass out.  You suddenly start to sweat or your skin gets clammy.  The catheter insertion area is bleeding, and the bleeding does not stop when you hold steady pressure on the area.  The area near or just beyond the catheter insertion site becomes pale, cool, tingly, or numb. These symptoms may represent a serious problem that is an emergency. Do not wait to see if the symptoms will go away. Get medical help right away. Call your local emergency services (911 in the [place] Do not drive yourself to the hospital. Summary  After the procedure, it is common to have bruising that usually fades within 1-2 weeks.  Check your femoral site every day for signs of infection.  Do not lift anything that is heavier than 10 lb (4.5 kg), or the   limit that you are told, until your health care provider says that it is safe. This information is not intended to replace advice given to you by your health care provider. Make sure you discuss any questions you have with your health care provider. Document Revised: 04/08/2017 Document Reviewed: 04/08/2017 Elsevier Patient Education  [place] After  This sheet gives you information about how to care for yourself after your procedure. Your health care provider may also give you more specific instructions. If you have problems or questions,  contact your health care provider. What can I expect after the procedure? After the procedure, it is common to have:  Bruising around your puncture site.  Tenderness around your puncture site.  Skipped heartbeats.  Tiredness (fatigue).  Follow these instructions at home: Puncture site care   Follow instructions from your health care provider about how to take care of your puncture site. Make sure you: ? If present, leave stitches (sutures), skin glue, or adhesive strips in place. These skin closures may need to stay in place for up to 2 weeks. If adhesive strip edges start to loosen and curl up, you may trim the loose edges. Do not remove adhesive strips completely unless your health care provider tells you to do that. ? If a large square bandage is present, this may be removed 24 hours after surgery.   Check your puncture site every day for signs of infection. Check for: ? Redness, swelling, or pain. ? Fluid or blood. If your puncture site starts to bleed, lie down on your back, apply firm pressure to the area, and contact your health care provider. ? Warmth. ? Pus or a bad smell. Driving  Do not drive for at least 4 days after your procedure or however long your health care provider recommends. (Do not resume driving if you have previously been instructed not to drive for other health reasons.)  Do not drive or use heavy machinery while taking prescription pain medicine. Activity  Avoid activities that take a lot of effort for at least 7 days after your procedure.  Do not lift anything that is heavier than 5 lb (4.5 kg) for one week.   No sexual activity for 1 week.   Return to your normal activities as told by your health care provider. Ask your health care provider what activities are safe for you. General instructions  Take over-the-counter and prescription medicines only as told by your health care provider.  Do not use any products that contain nicotine or tobacco,  such as cigarettes and e-cigarettes. If you need help quitting, ask your health care provider.  You may shower after 24 hours, but Do not take baths, swim, or use a hot tub for 1 week.   Do not drink alcohol for 24 hours after your procedure.  Keep all follow-up visits as told by your health care provider. This is important. Contact a health care provider if:  You have redness, mild swelling, or pain around your puncture site.  You have fluid or blood coming from your puncture site that stops after applying firm pressure to the area.  Your puncture site feels warm to the touch.  You have pus or a bad smell coming from your puncture site.  You have a fever.  You have chest pain or discomfort that spreads to your neck, jaw, or arm.  You are sweating a lot.  You feel nauseous.  You have a fast or irregular heartbeat.  You have shortness of breath.  You are dizzy or light-headed and feel the need to lie down.  You have pain or numbness in the arm or leg closest to your puncture site. Get help right away if:  Your puncture site suddenly swells.  Your puncture site is bleeding and the bleeding does not stop after applying firm pressure to the area. These symptoms may represent a serious problem that is an emergency. Do not wait to see if the symptoms will go away. Get medical help right away. Call your local emergency services (911 in the [place] Do not drive yourself to the hospital. Summary  After the procedure, it is normal to have bruising and tenderness at the puncture site in your groin, neck, or forearm.  Check your puncture site every day for signs of infection.  Get help right away if your puncture site is bleeding and the bleeding does not stop after applying firm pressure to the area. This is a medical emergency. This information is not intended to replace advice given to you by your health care provider. Make sure you discuss any questions you have with your health  care provider.

## 2020-03-04 NOTE — Interval H&P Note (Signed)
History and Physical Interval Note:  Michael Garner has presented today for surgery, with the diagnosis of atrial fibrillation.  The various methods of treatment have been discussed with the patient and family. After consideration of risks, benefits and other options for treatment, the patient has consented to  Procedure(s): Catheter ablation as a surgical intervention .  Risks include but not limited to complete heart block, stroke, esophageal damage, nerve damage, bleeding, vascular damage, tamponade, perforation, MI, and death. The patient's history has been reviewed, patient examined, no change in status, stable for surgery.  I have reviewed the patient's chart and labs.  Questions were answered to the patient's satisfaction.    [name], MD 03/04/2020 7:04 AM

## 2020-03-05 ENCOUNTER — Encounter (HOSPITAL_COMMUNITY): Payer: Self-pay

## 2020-03-05 ENCOUNTER — Emergency Department (HOSPITAL_COMMUNITY): Payer: 59

## 2020-03-05 ENCOUNTER — Observation Stay (HOSPITAL_COMMUNITY): Payer: 59

## 2020-03-05 ENCOUNTER — Inpatient Hospital Stay (HOSPITAL_COMMUNITY)
Admission: EM | Admit: 2020-03-05 | Discharge: 2020-03-09 | DRG: 981 | Disposition: A | Discharge status: Home / Self Care | Payer: 59 | Attending: Cardiology | Admitting: Cardiology

## 2020-03-05 DIAGNOSIS — Z6835 Body mass index (BMI) 35.0-35.9, adult: Secondary | ICD-10-CM

## 2020-03-05 DIAGNOSIS — I4891 Unspecified atrial fibrillation: Secondary | ICD-10-CM

## 2020-03-05 DIAGNOSIS — I10 Essential (primary) hypertension: Secondary | ICD-10-CM | POA: Diagnosis not present

## 2020-03-05 DIAGNOSIS — N401 Enlarged prostate with lower urinary tract symptoms: Secondary | ICD-10-CM | POA: Diagnosis present

## 2020-03-05 DIAGNOSIS — J9601 Acute respiratory failure with hypoxia: Secondary | ICD-10-CM | POA: Diagnosis present

## 2020-03-05 DIAGNOSIS — E876 Hypokalemia: Secondary | ICD-10-CM | POA: Diagnosis present

## 2020-03-05 DIAGNOSIS — J129 Viral pneumonia, unspecified: Secondary | ICD-10-CM | POA: Diagnosis present

## 2020-03-05 DIAGNOSIS — R000 Tachycardia, unspecified: Secondary | ICD-10-CM | POA: Diagnosis present

## 2020-03-05 DIAGNOSIS — I509 Heart failure, unspecified: Secondary | ICD-10-CM | POA: Diagnosis not present

## 2020-03-05 DIAGNOSIS — Z7901 Long term (current) use of anticoagulants: Secondary | ICD-10-CM

## 2020-03-05 DIAGNOSIS — F419 Anxiety disorder, unspecified: Secondary | ICD-10-CM | POA: Diagnosis present

## 2020-03-05 DIAGNOSIS — Z888 Allergy status to other drugs, medicaments and biological substances status: Secondary | ICD-10-CM

## 2020-03-05 DIAGNOSIS — N529 Male erectile dysfunction, unspecified: Secondary | ICD-10-CM | POA: Diagnosis present

## 2020-03-05 DIAGNOSIS — I4819 Other persistent atrial fibrillation: Secondary | ICD-10-CM | POA: Diagnosis present

## 2020-03-05 DIAGNOSIS — I5031 Acute diastolic (congestive) heart failure: Secondary | ICD-10-CM | POA: Diagnosis present

## 2020-03-05 DIAGNOSIS — Z79899 Other long term (current) drug therapy: Secondary | ICD-10-CM

## 2020-03-05 DIAGNOSIS — I4892 Unspecified atrial flutter: Secondary | ICD-10-CM | POA: Diagnosis not present

## 2020-03-05 DIAGNOSIS — E669 Obesity, unspecified: Secondary | ICD-10-CM | POA: Diagnosis present

## 2020-03-05 DIAGNOSIS — Z880 Allergy status to penicillin: Secondary | ICD-10-CM

## 2020-03-05 DIAGNOSIS — J810 Acute pulmonary edema: Secondary | ICD-10-CM | POA: Diagnosis not present

## 2020-03-05 DIAGNOSIS — Z87891 Personal history of nicotine dependence: Secondary | ICD-10-CM

## 2020-03-05 DIAGNOSIS — I480 Paroxysmal atrial fibrillation: Secondary | ICD-10-CM | POA: Diagnosis present

## 2020-03-05 DIAGNOSIS — R0602 Shortness of breath: Secondary | ICD-10-CM | POA: Insufficient documentation

## 2020-03-05 DIAGNOSIS — Z20822 Contact with and (suspected) exposure to covid-19: Secondary | ICD-10-CM | POA: Diagnosis present

## 2020-03-05 DIAGNOSIS — G4733 Obstructive sleep apnea (adult) (pediatric): Secondary | ICD-10-CM | POA: Diagnosis present

## 2020-03-05 DIAGNOSIS — F32A Depression, unspecified: Secondary | ICD-10-CM | POA: Diagnosis present

## 2020-03-05 DIAGNOSIS — J811 Chronic pulmonary edema: Secondary | ICD-10-CM

## 2020-03-05 DIAGNOSIS — J210 Acute bronchiolitis due to respiratory syncytial virus: Secondary | ICD-10-CM | POA: Diagnosis present

## 2020-03-05 DIAGNOSIS — N138 Other obstructive and reflux uropathy: Secondary | ICD-10-CM | POA: Diagnosis present

## 2020-03-05 DIAGNOSIS — I110 Hypertensive heart disease with heart failure: Secondary | ICD-10-CM | POA: Diagnosis present

## 2020-03-05 DIAGNOSIS — J90 Pleural effusion, not elsewhere classified: Secondary | ICD-10-CM | POA: Diagnosis present

## 2020-03-05 DIAGNOSIS — I161 Hypertensive emergency: Secondary | ICD-10-CM | POA: Diagnosis present

## 2020-03-05 HISTORY — DX: Unspecified atrial fibrillation: I48.91

## 2020-03-05 HISTORY — DX: Chronic pulmonary edema: J81.1

## 2020-03-05 LAB — CBC WITH DIFFERENTIAL/PLATELET
Abs Immature Granulocytes: 0.09 10*3/uL — ABNORMAL HIGH (ref 0.00–0.07)
Basophils Absolute: 0.1 10*3/uL (ref 0.0–0.1)
Basophils Relative: 1 %
Eosinophils Absolute: 0.1 10*3/uL (ref 0.0–0.5)
Eosinophils Relative: 1 %
HCT: 48.7 % (ref 39.0–52.0)
Hemoglobin: 15.8 g/dL (ref 13.0–17.0)
Immature Granulocytes: 1 %
Lymphocytes Relative: 16 %
Lymphs Abs: 1.9 10*3/uL (ref 0.7–4.0)
MCH: 30.5 pg (ref 26.0–34.0)
MCHC: 32.4 g/dL (ref 30.0–36.0)
MCV: 94 fL (ref 80.0–100.0)
Monocytes Absolute: 0.7 10*3/uL (ref 0.1–1.0)
Monocytes Relative: 6 %
Neutro Abs: 9.1 10*3/uL — ABNORMAL HIGH (ref 1.7–7.7)
Neutrophils Relative %: 75 %
Platelets: 198 10*3/uL (ref 150–400)
RBC: 5.18 MIL/uL (ref 4.22–5.81)
RDW: 14 % (ref 11.5–15.5)
WBC: 12 10*3/uL — ABNORMAL HIGH (ref 4.0–10.5)
nRBC: 0 % (ref 0.0–0.2)

## 2020-03-05 LAB — I-STAT CHEM 8, ED
BUN: 29 mg/dL — ABNORMAL HIGH (ref 8–23)
Calcium, Ion: 1.04 mmol/L — ABNORMAL LOW (ref 1.15–1.40)
Chloride: 104 mmol/L (ref 98–111)
Creatinine, Ser: 1.1 mg/dL (ref 0.61–1.24)
Glucose, Bld: 156 mg/dL — ABNORMAL HIGH (ref 70–99)
HCT: 49 % (ref 39.0–52.0)
Hemoglobin: 16.7 g/dL (ref 13.0–17.0)
Potassium: 3.8 mmol/L (ref 3.5–5.1)
Sodium: 139 mmol/L (ref 135–145)
TCO2: 21 mmol/L — ABNORMAL LOW (ref 22–32)

## 2020-03-05 LAB — I-STAT VENOUS BLOOD GAS, ED
Acid-base deficit: 4 mmol/L — ABNORMAL HIGH (ref 0.0–2.0)
Bicarbonate: 22.9 mmol/L (ref 20.0–28.0)
Calcium, Ion: 1.05 mmol/L — ABNORMAL LOW (ref 1.15–1.40)
HCT: 49 % (ref 39.0–52.0)
Hemoglobin: 16.7 g/dL (ref 13.0–17.0)
O2 Saturation: 86 %
Potassium: 3.8 mmol/L (ref 3.5–5.1)
Sodium: 139 mmol/L (ref 135–145)
TCO2: 24 mmol/L (ref 22–32)
pCO2, Ven: 46.7 mmHg (ref 44.0–60.0)
pH, Ven: 7.298 (ref 7.250–7.430)
pO2, Ven: 57 mmHg — ABNORMAL HIGH (ref 32.0–45.0)

## 2020-03-05 LAB — I-STAT ARTERIAL BLOOD GAS, ED
Acid-base deficit: 4 mmol/L — ABNORMAL HIGH (ref 0.0–2.0)
Bicarbonate: 22 mmol/L (ref 20.0–28.0)
Calcium, Ion: 1.11 mmol/L — ABNORMAL LOW (ref 1.15–1.40)
HCT: 46 % (ref 39.0–52.0)
Hemoglobin: 15.6 g/dL (ref 13.0–17.0)
O2 Saturation: 99 %
Patient temperature: 98.3
Potassium: 3.3 mmol/L — ABNORMAL LOW (ref 3.5–5.1)
Sodium: 138 mmol/L (ref 135–145)
TCO2: 23 mmol/L (ref 22–32)
pCO2 arterial: 41.4 mmHg (ref 32.0–48.0)
pH, Arterial: 7.331 — ABNORMAL LOW (ref 7.350–7.450)
pO2, Arterial: 152 mmHg — ABNORMAL HIGH (ref 83.0–108.0)

## 2020-03-05 LAB — COMPREHENSIVE METABOLIC PANEL
ALT: 37 U/L (ref 0–44)
AST: 46 U/L — ABNORMAL HIGH (ref 15–41)
Albumin: 4 g/dL (ref 3.5–5.0)
Alkaline Phosphatase: 66 U/L (ref 38–126)
Anion gap: 13 (ref 5–15)
BUN: 23 mg/dL (ref 8–23)
CO2: 19 mmol/L — ABNORMAL LOW (ref 22–32)
Calcium: 8.1 mg/dL — ABNORMAL LOW (ref 8.9–10.3)
Chloride: 105 mmol/L (ref 98–111)
Creatinine, Ser: 1.19 mg/dL (ref 0.61–1.24)
GFR, Estimated: >60 mL/min (ref >60)
Glucose, Bld: 164 mg/dL — ABNORMAL HIGH (ref 70–99)
Potassium: 3.5 mmol/L (ref 3.5–5.1)
Sodium: 137 mmol/L (ref 135–145)
Total Bilirubin: 1.2 mg/dL (ref 0.3–1.2)
Total Protein: 6.5 g/dL (ref 6.5–8.1)

## 2020-03-05 LAB — ECHOCARDIOGRAM LIMITED
Calc EF: 27.5 %
S' Lateral: 4.3 cm
Single Plane A2C EF: 21.3 %
Single Plane A4C EF: 27.4 %

## 2020-03-05 LAB — BRAIN NATRIURETIC PEPTIDE: B Natriuretic Peptide: 342.8 pg/mL — ABNORMAL HIGH (ref 0.0–100.0)

## 2020-03-05 LAB — HIV ANTIBODY (ROUTINE TESTING W REFLEX): HIV Screen 4th Generation wRfx: NONREACTIVE

## 2020-03-05 LAB — RESP PANEL BY RT-PCR (RSV, FLU A&B, COVID)  RVPGX2
Influenza A by PCR: NEGATIVE
Influenza B by PCR: NEGATIVE
Resp Syncytial Virus by PCR: POSITIVE — AB
SARS Coronavirus 2 by RT PCR: NEGATIVE

## 2020-03-05 LAB — TROPONIN I (HIGH SENSITIVITY)
Troponin I (High Sensitivity): <2 ng/L (ref <18)
Troponin I (High Sensitivity): 1803 ng/L (ref <18)

## 2020-03-05 LAB — RESP PANEL BY RT-PCR (FLU A&B, COVID) ARPGX2
Influenza A by PCR: NEGATIVE
Influenza B by PCR: NEGATIVE
SARS Coronavirus 2 by RT PCR: NEGATIVE

## 2020-03-05 LAB — MAGNESIUM: Magnesium: 1.9 mg/dL (ref 1.7–2.4)

## 2020-03-05 LAB — PROTIME-INR
INR: 1.4 — ABNORMAL HIGH (ref 0.8–1.2)
Prothrombin Time: 16.3 seconds — ABNORMAL HIGH (ref 11.4–15.2)

## 2020-03-05 LAB — POTASSIUM: Potassium: 4 mmol/L (ref 3.5–5.1)

## 2020-03-05 MED ORDER — POTASSIUM CHLORIDE 10 MEQ/100ML IV SOLN
10.0000 meq | Freq: Once | INTRAVENOUS | Status: AC
  Administered 2020-03-05: 10 meq via INTRAVENOUS
  Filled 2020-03-05: qty 100

## 2020-03-05 MED ORDER — NITROGLYCERIN IN D5W 200-5 MCG/ML-% IV SOLN
0.0000 ug/min | INTRAVENOUS | Status: DC
  Administered 2020-03-05: 5 ug/min via INTRAVENOUS
  Filled 2020-03-05: qty 250

## 2020-03-05 MED ORDER — SODIUM CHLORIDE 0.9% FLUSH
3.0000 mL | Freq: Two times a day (BID) | INTRAVENOUS | Status: DC
  Administered 2020-03-05 – 2020-03-09 (×9): 3 mL via INTRAVENOUS

## 2020-03-05 MED ORDER — NITROGLYCERIN 2 % TD OINT
1.0000 [in_us] | TOPICAL_OINTMENT | Freq: Once | TRANSDERMAL | Status: AC
  Administered 2020-03-05: 1 [in_us] via TOPICAL

## 2020-03-05 MED ORDER — FUROSEMIDE 10 MG/ML IJ SOLN
40.0000 mg | INTRAMUSCULAR | Status: AC
  Administered 2020-03-05: 40 mg via INTRAVENOUS
  Filled 2020-03-05: qty 4

## 2020-03-05 MED ORDER — POTASSIUM CHLORIDE 20 MEQ PO PACK
20.0000 meq | PACK | Freq: Once | ORAL | Status: AC
  Administered 2020-03-05: 20 meq via ORAL
  Filled 2020-03-05: qty 1

## 2020-03-05 MED ORDER — FUROSEMIDE 10 MG/ML IJ SOLN
40.0000 mg | Freq: Two times a day (BID) | INTRAMUSCULAR | Status: DC
  Administered 2020-03-05 – 2020-03-06 (×4): 40 mg via INTRAVENOUS
  Filled 2020-03-05 (×4): qty 4

## 2020-03-05 MED ORDER — PERFLUTREN LIPID MICROSPHERE
1.0000 mL | INTRAVENOUS | Status: AC | PRN
  Administered 2020-03-05: 3 mL via INTRAVENOUS
  Filled 2020-03-05: qty 10

## 2020-03-05 MED ORDER — ACETAMINOPHEN 325 MG PO TABS
650.0000 mg | ORAL_TABLET | Freq: Four times a day (QID) | ORAL | Status: DC | PRN
  Administered 2020-03-05 (×3): 650 mg via ORAL
  Filled 2020-03-05 (×3): qty 2

## 2020-03-05 MED ORDER — METOPROLOL SUCCINATE ER 50 MG PO TB24
50.0000 mg | ORAL_TABLET | Freq: Every day | ORAL | Status: DC
  Administered 2020-03-05 – 2020-03-09 (×5): 50 mg via ORAL
  Filled 2020-03-05 (×4): qty 1

## 2020-03-05 MED ORDER — LORAZEPAM 2 MG/ML IJ SOLN
0.5000 mg | Freq: Once | INTRAMUSCULAR | Status: AC
  Administered 2020-03-05: 0.5 mg via INTRAVENOUS
  Filled 2020-03-05: qty 1

## 2020-03-05 MED ORDER — APIXABAN 5 MG PO TABS
5.0000 mg | ORAL_TABLET | Freq: Two times a day (BID) | ORAL | Status: DC
  Administered 2020-03-05 – 2020-03-09 (×9): 5 mg via ORAL
  Filled 2020-03-05 (×9): qty 1

## 2020-03-05 MED ORDER — METOPROLOL SUCCINATE ER 25 MG PO TB24: 25.0000 mg | ORAL_TABLET | Freq: Every day | ORAL | Status: DC

## 2020-03-05 MED ORDER — METOPROLOL SUCCINATE ER 50 MG PO TB24
50.0000 mg | ORAL_TABLET | Freq: Every day | ORAL | Status: DC
  Filled 2020-03-05: qty 1

## 2020-03-05 MED ORDER — IOHEXOL 350 MG/ML SOLN
75.0000 mL | Freq: Once | INTRAVENOUS | Status: AC | PRN
  Administered 2020-03-05: 75 mL via INTRAVENOUS

## 2020-03-05 MED ORDER — POTASSIUM CHLORIDE 20 MEQ PO PACK
40.0000 meq | PACK | Freq: Once | ORAL | Status: AC
  Administered 2020-03-05: 40 meq via ORAL
  Filled 2020-03-05: qty 2

## 2020-03-05 NOTE — Progress Notes (Signed)
  Echocardiogram 2D Echocardiogram has been performed with [facility].  Michael Garner 03/05/2020, 10:02 AM

## 2020-03-05 NOTE — ED Provider Notes (Signed)
[facility] EMERGENCY DEPARTMENT Provider Note   CSN: [id number] Arrival date & time: 03/05/20  3329     History Chief Complaint  Patient presents with  . Respiratory Distress    Michael Garner is a 64 y.o. male.   64 year old male with a history of A. fib (on chronic Eliquis), BPH presents to the emergency department for evaluation of shortness of breath.  He had a cardiac ablation yesterday morning by Dr. Curt Bears for atrial fibrillation.  Was discharged around 1600.  At time of discharge, patient had a mild cough.  Felt as though he needed to produce some phlegm, but cough remained nonproductive.  Shortness of breath progressed to the evening and became more severe at 2300 when patient went to get his daughter.  EMS arrived to find the patient on the side of the highway tripoding, and respiratory distress, hypertensive.  He was placed on BiPAP and given 2.5 mg Versed IM.  States that his shortness of breath is improved since BiPAP started, but still significant.  Continues to speak in very truncated sentences.  Does indicate that his breathing feels improved when he leans forward with approximately 90 degree hip flexion.  He denies fevers, chest pain, chest tightness, hemoptysis, diaphoresis, syncope, leg swelling.       Past Medical History:  Diagnosis Date  . A-fib ([place])   . BPH with urinary obstruction   . Erectile dysfunction   . Hepatitis B infection    resolved   . Pulmonary edema 03/05/2020    Patient Active Problem List   Diagnosis Date Noted  . Pulmonary edema 03/05/2020  . Shortness of breath 03/05/2020  . Acute pulmonary edema ([place]) 03/05/2020  . Paroxysmal atrial fibrillation ([place]) 06/24/2019  . Osteoarthritis 01/19/2019  . Peroneal tendinitis 01/13/2018  . Depressive disorder, not elsewhere classified 12/19/2009  . INSOMNIA 12/19/2009  . LUMBAR STRAIN 11/17/2009  . BENIGN PROSTATIC HYPERTROPHY, WITH OBSTRUCTION 09/30/2009  . SUBACROMIAL  BURSITIS 05/12/2008  . HEPATITIS B, HX OF 05/12/2008    Past Surgical History:  Procedure Laterality Date  . CARDIOVERSION  05/16/2018  . COLONOSCOPY  07-18-11    per Dr. Sharlett Iles, diverticulosis only, repeat in 10 yrs   . LUMBAR MICRODISCECTOMY  2002       Family History  Problem Relation Age of Onset  . Alcohol abuse Father   . Lung cancer Father     Social History   Tobacco Use  . Smoking status: Former Research scientist (life sciences)  . Smokeless tobacco: Never Used  Substance Use Topics  . Alcohol use: Yes    Alcohol/week: 2.0 - 4.0 standard drinks    Types: 1 - 2 Glasses of wine, 1 - 2 Cans of beer per week  . Drug use: No    Home Medications Prior to Admission medications   Medication Sig Start Date End Date Taking? Authorizing Provider  apixaban (ELIQUIS) 5 MG TABS tablet Take 1 tablet (5 mg total) by mouth 2 (two) times daily. 01/15/20  Yes Fenton, Clint R, PA  diltiazem (CARDIZEM CD) 120 MG 24 hr capsule Take 1 capsule (120 mg total) by mouth daily. 01/15/20  Yes Fenton, Clint R, PA  ibuprofen (ADVIL,MOTRIN) 400 MG tablet Take 400 mg by mouth daily as needed for mild pain or moderate pain (Lower pain).    Yes [provider]  tadalafil (CIALIS) 20 MG tablet Take 1 tablet (20 mg total) by mouth as needed for erectile dysfunction. Patient taking differently: Take 20 mg by mouth daily  as needed for erectile dysfunction.  02/22/15 05/16/26 Yes Laurey Morale, MD    Allergies    Dextromethorphan-guaifenesin, Penicillins, and Prednisone  Review of Systems   Review of Systems  Ten systems reviewed and are negative for acute change, except as noted in the HPI.   Physical Exam Updated Vital Signs BP (!) 131/97 (BP Location: Right Arm)   Pulse 99   Temp (!) 96.8 F (36 C) (Axillary)   Resp (!) 23   SpO2 99%   Physical Exam Vitals and nursing note reviewed.  Constitutional:      General: He is in acute distress.     Appearance: He is well-developed. He is not diaphoretic.      Comments: Mild distress, ill appearing.  HENT:     Head: Normocephalic and atraumatic.  Eyes:     General: No scleral icterus.    Conjunctiva/sclera: Conjunctivae normal.  Cardiovascular:     Rate and Rhythm: Regular rhythm. Tachycardia present.     Pulses: Normal pulses.  Pulmonary:     Effort: Pulmonary effort is normal. No respiratory distress.     Breath sounds: No stridor. No wheezing.     Comments: Speaking in truncated sentences. Lungs grossly CTAB. Musculoskeletal:        General: Normal range of motion.     Cervical back: Normal range of motion.     Comments: No significant BLE edema.  Skin:    General: Skin is warm and dry.     Coloration: Skin is not pale.     Findings: No erythema or rash.  Neurological:     Mental Status: He is alert and oriented to person, place, and time.     Coordination: Coordination normal.     Comments: GCS 15. Answers questions appropriately. Follows commands.  Psychiatric:        Behavior: Behavior normal.     ED Results / Procedures / Treatments   Labs (all labs ordered are listed, but only abnormal results are displayed) Labs Reviewed  RESP PANEL BY RT-PCR (RSV, FLU A&B, COVID)  RVPGX2 - Abnormal; Notable for the following components:      Result Value   Resp Syncytial Virus by PCR POSITIVE (*)    All other components within normal limits  CBC WITH DIFFERENTIAL/PLATELET - Abnormal; Notable for the following components:   WBC 12.0 (*)    Neutro Abs 9.1 (*)    Abs Immature Granulocytes 0.09 (*)    All other components within normal limits  COMPREHENSIVE METABOLIC PANEL - Abnormal; Notable for the following components:   CO2 19 (*)    Glucose, Bld 164 (*)    Calcium 8.1 (*)    AST 46 (*)    All other components within normal limits  BRAIN NATRIURETIC PEPTIDE - Abnormal; Notable for the following components:   B Natriuretic Peptide 342.8 (*)    All other components within normal limits  PROTIME-INR - Abnormal; Notable for the  following components:   Prothrombin Time 16.3 (*)    INR 1.4 (*)    All other components within normal limits  I-STAT CHEM 8, ED - Abnormal; Notable for the following components:   BUN 29 (*)    Glucose, Bld 156 (*)    Calcium, Ion 1.04 (*)    TCO2 21 (*)    All other components within normal limits  I-STAT VENOUS BLOOD GAS, ED - Abnormal; Notable for the following components:   pO2, Ven 57.0 (*)  Acid-base deficit 4.0 (*)    Calcium, Ion 1.05 (*)    All other components within normal limits  I-STAT ARTERIAL BLOOD GAS, ED - Abnormal; Notable for the following components:   pH, Arterial 7.331 (*)    pO2, Arterial 152 (*)    Acid-base deficit 4.0 (*)    Potassium 3.3 (*)    Calcium, Ion 1.11 (*)    All other components within normal limits  TROPONIN I (HIGH SENSITIVITY) - Abnormal; Notable for the following components:   Troponin I (High Sensitivity) 1,803 (*)    All other components within normal limits  RESP PANEL BY RT-PCR (FLU A&B, COVID) ARPGX2  MAGNESIUM  POTASSIUM  HIV ANTIBODY (ROUTINE TESTING W REFLEX)  TROPONIN I (HIGH SENSITIVITY)    EKG EKG Interpretation  Date/Time:  Saturday March 05 2020 00:28:08 EST Ventricular Rate:  113 PR Interval:    QRS Duration: 107 QT Interval:  330 QTC Calculation: 453 R Axis:   14 Text Interpretation: Sinus tachycardia LAE, consider biatrial enlargement Consider anterior infarct Rate is faster than previous Confirmed by Pryor Curia [phone]) on 03/05/2020 12:49:20 AM   Radiology EP STUDY  Result Date: 03/04/2020 SURGEON:  Allegra Lai, MD PREPROCEDURE DIAGNOSES: 1. Persistent atrial fibrillation. POSTPROCEDURE DIAGNOSES: 1. Persistent atrial fibrillation. PROCEDURES: 1. Comprehensive electrophysiologic study. 2. Coronary sinus pacing and recording. 3. Three-dimensional mapping of atrial fibrillation (with additional mapping and ablation within the left atrium due to persistence of afib) 4. Ablation of atrial fibrillation  (with additional mapping and ablation within the left atrium due to persistence of afib) 5. Intracardiac echocardiography. 6. Transseptal puncture of an intact septum. 7. Arrhythmia induction with pacing 8. External cardioversion. INTRODUCTION:  Tavaras Goody is a 64 y.o. male with a history of persistent atrial fibrillation who now presents for EP study and radiofrequency ablation.  The patient reports initially being diagnosed with atrial fibrillation after presenting with symptomatic palpitations and fatgiue.  The patient has failed medical therapy.  The patient therefore presents today for catheter ablation of atrial fibrillation. DESCRIPTION OF PROCEDURE:  Informed written consent was obtained, and the patient was brought to the electrophysiology lab in a fasting state.  The patient was adequately sedated with intravenous medications as outlined in the anesthesia report.  The patient's left and right groins were prepped and draped in the usual sterile fashion by the EP lab staff.  Using a percutaneous Seldinger technique, two 7-French and one 11-French hemostasis sheaths were placed into the right common femoral vein.  An esophageal temperature probe was inserted to monitor for heating of the esophagus during the procedure. Direct ultrasound guidance is used for right and left femoral veins with normal vessel patency. Ultrasound images are captured and stored in the patient's chart. Using ultrasound guidance, the Brockenbrough needle and wire were visualized entering the vessel. Catheter Placement:  A 7-French [employer] Decapolar coronary sinus catheter was introduced through the right common femoral vein and advanced into the coronary sinus for recording and pacing from this location.    Initial Measurements: The patient presented to the electrophysiology lab in atrial fibrillation.   The average RR interval measured 351 msec.   Intracardiac Echocardiography: A 10-French [employer] Webster AcuNav  intracardiac echocardiography catheter was introduced through the right common femoral vein and advanced into the right atrium. Intracardiac echocardiography was performed of the left atrium, and a three-dimensional anatomical rendering of the left atrium was performed using CARTO sound technology.  The patient was noted to have a moderate sized left  atrium.  The interatrial septum was prominent but not aneurysmal. All 4 pulmonary veins were visualized and noted to have separate ostia.  The pulmonary veins were moderate in size.  The left atrial appendage was visualized and did not reveal thrombus.   There was no evidence of pulmonary vein stenosis. Transseptal Puncture: The right common femoral vein sheaths were was exchanged for an 8.5 [place] Agillis transseptal sheath and transseptal access was achieved in a standard fashion using a Brockenbrough needle under biplane fluoroscopy with intracardiac echocardiography confirmation of the transseptal puncture.  Once transseptal access had been achieved, heparin was administered intravenously and intra- arterially in order to maintain an ACT of greater than 350 seconds throughout the procedure.  3D Mapping and Ablation: A 3.5 mm [employer] Thermocool ablation catheter was advanced into the right atrium.  The transseptal sheath was pulled back into the IVC over a guidewire.  The ablation catheter was advanced across the transseptal hole using the wire as a guide.  The transseptal sheath was then re-advanced over the guidewire into the left atrium.  A duodecapolar Biosense Webster pentarray mapping catheter was introduced through the transseptal sheath and positioned over the mouth of all 4 pulmonary veins.  Three-dimensional electroanatomical mapping was performed using CARTO technology.  This demonstrated electrical activity within all four pulmonary veins at baseline. The patient underwent successful sequential electrical isolation and anatomical  encircling of all four pulmonary veins using radiofrequency current with a circular mapping catheter as a guide. A WACA approach was used. Due to persistence of atrial fibrillation, additional left atrial mapping and ablation was performed.  A series of radiofrequency lesions were delivered along the roof and floor of the left atrium in order to create a "standard box" lesion along the posterior wall of the left atrium. 20 mcg/kg/min of dobutamine was infused without arrhythmia induced. Cardioversion: The patient was then cardioverted to sinus rhythm with a single synchronized 250-J biphasic shock with cardioversion electrodes in the anterior-posterior thoracic configuration. he maintained sinus rhythm initially but then had recurence of afib with catheter manipulation within the left atrial, requiring repeat cardioversion with 250J biphasic.  he remained in sinus rhythm thereafter. Measurements Following Ablation: In sinus rhythm the RR interval was 711msec, with PR 165 msec, QRS 78 msec, and QT 386 msec.  Following ablation the AH interval measured 57 msec with an HV interval of 50 msec. Ventricular pacing was performed, which revealed midline decremental VA conduction with a VA Wenckebach cycle length of 310 msec. Rapid atrial pacing was performed, which revealed an AV Wenckebach cycle length of 270 msec.  Electroisolation was then again confirmed in all four pulmonary veins. The procedure was therefore considered completed.  All catheters were removed, and the sheaths were aspirated and flushed.  The patient was transferred to the recovery area for sheath removal per protocol.  Intracardiac echocardiogram revealed no pericardial effusion. EBL<71ml.  There were no early apparent complications. CONCLUSIONS: 1. Atrial fibrillation upon presentation.  2. Successful electrical isolation and anatomical encircling of all four pulmonary veins with radiofrequency current.  A WACA approach was used 3. Additional left  atrial ablation was performed with a standard box lesion created along the posterior wall of the left atrium 4. Atrial fibrillation successfully cardioverted to sinus rhythm. 5. No early apparent complications. Ocie Doyne Camnitz,MD 10:51 AM 03/04/2020   DG Chest Portable 1 View  Result Date: 03/05/2020 CLINICAL DATA:  Worsening shortness of breath EXAM: PORTABLE CHEST 1 VIEW COMPARISON:  X-ray earlier  in the same day FINDINGS: Again noted are findings of congestive heart failure with developing pulmonary edema. These findings are essentially unchanged from the patient's recent prior study. There is no pneumothorax. No acute osseous abnormality. IMPRESSION: Persistent congestive heart failure with pulmonary edema. Electronically Signed   By: Constance Holster M.D.   On: 03/05/2020 03:26   DG Chest Port 1 View  Result Date: 03/05/2020 CLINICAL DATA:  Shortness of breath EXAM: PORTABLE CHEST 1 VIEW COMPARISON:  05/16/2018 FINDINGS: Bibasilar airspace disease is noted. The heart size is enlarged. There are probable small to moderate-sized bilateral pleural effusions. There is no pneumothorax. No acute osseous abnormality. IMPRESSION: Cardiomegaly with findings suggestive of congestive heart failure with growing bilateral pleural effusions. There is bibasilar airspace disease favored to represent atelectasis with developing infiltrates not excluded. Electronically Signed   By: Constance Holster M.D.   On: 03/05/2020 01:18    Procedures .Critical Care Performed by: Antonietta Breach, PA-C Authorized by: Antonietta Breach, PA-C   Critical care provider statement:    Critical care time (minutes):  45   Critical care was necessary to treat or prevent imminent or life-threatening deterioration of the following conditions:  Respiratory failure   Critical care was time spent personally by me on the following activities:  Discussions with consultants, evaluation of patient's response to treatment, examination of  patient, ordering and performing treatments and interventions, ordering and review of laboratory studies, ordering and review of radiographic studies, pulse oximetry, re-evaluation of patient's condition, obtaining history from patient or surrogate and review of old charts   (including critical care time)  Medications Ordered in ED Medications  apixaban (ELIQUIS) tablet 5 mg (5 mg Oral Not Given 03/05/20 0320)  sodium chloride flush (NS) 0.9 % injection 3 mL (3 mLs Intravenous Given 03/05/20 0326)  furosemide (LASIX) injection 40 mg (has no administration in time range)  potassium chloride (KLOR-CON) packet 40 mEq (has no administration in time range)  potassium chloride (KLOR-CON) packet 20 mEq (has no administration in time range)  nitroGLYCERIN 50 mg in dextrose 5 % 250 mL (0.2 mg/mL) infusion (10 mcg/min Intravenous Rate/Dose Change 03/05/20 0419)  metoprolol succinate (TOPROL-XL) 24 hr tablet 25 mg (has no administration in time range)  potassium chloride 10 mEq in 100 mL IVPB (has no administration in time range)  furosemide (LASIX) injection 40 mg (40 mg Intravenous Given 03/05/20 0239)  nitroGLYCERIN (NITROGLYN) 2 % ointment 1 inch (1 inch Topical Given 03/05/20 0314)  LORazepam (ATIVAN) injection 0.5 mg (0.5 mg Intravenous Given 03/05/20 0314)    ED Course  I have reviewed the triage vital signs and the nursing notes.  Pertinent labs & imaging results that were available during my care of the patient were reviewed by me and considered in my medical decision making (see chart for details).  Clinical Course as of Mar 05 732  Sat Mar 05, 2020  0132 EKG reviewed with sinus tachycardia. No evidence of acute ischemia, heart block. Low suspicion for thromboembolism given chronic anticoagulation, on Eliquis. Feel coronary artery dissection is less likely as well given absence of chest pain.   Bedside cardiac US performed without evidence of tamponade. Patient doing better on BiPAP.  Appears more comfortable.   CXR reviewed with pleural effusions, evidence of CHF. BNP pending.    [KH]  1751 Spoke with Cardiology. Agree that pulmonary edema sounds c/w history and cause of symptoms today, but unlikely to be secondary to his ablation. Will assess patient in the ED with plans  for Cardiology admission.   [AT]  5573 Patient complaining of worsening shortness of breath following IV Lasix.  He appears more dyspneic with mild tachypnea.  Saturations remain at or above 95% on BiPAP.  He does sound more rhonchorous diffusely on auscultation.  Repeat chest x-ray ordered as well as Nitropaste.  Will give low-dose IV Ativan as he does appear anxious.   [KH]  [id number] Dr. Rudi Rummage of Cardiology updated by MD Ward on interval change and orders placed for nitroglycerine paste and ativan, repeat CXR.   [KH]    Clinical Course User Index [KH] Beverely Pace   MDM Rules/Calculators/A&P                          64 year old male to be admitted to the cardiology service for evaluation consistent with CHF exacerbation.  Has been tolerating BiPAP well with good oxygen saturations.  Given Lasix and started on nitroglycerin drip for management of hypertension.  Had cardiac ablation yesterday for management of atrial fibrillation.  Is chronically anticoagulated on Eliquis.   Final Clinical Impression(s) / ED Diagnoses Final diagnoses:  Acute on chronic congestive heart failure, unspecified heart failure type [facility])    Rx / DC Orders ED Discharge Orders    None       Antonietta Breach, PA-C 03/05/20 [place], Delice Bison, DO 03/05/20 [phone]

## 2020-03-05 NOTE — ED Triage Notes (Signed)
Pt comes via [facility] for resp distress, pt had ablation here this AM for afib, pt was on the side of the highway in the tripod position, hypertensive, speaking in one word sentences, accessory muscle use. PTA received 2.5 versed IM

## 2020-03-05 NOTE — Progress Notes (Signed)
CRITICAL VALUE ALERT  Critical Value:  Trop 1803  Date & Time Notied:  03/05/20 @ 9012  Provider Notified: Dr. Rudi Rummage  On-call Card Fellow  Orders Received/Actions taken: Waiting for response from MD

## 2020-03-05 NOTE — H&P (Addendum)
CARDIOLOGY ADMISSION NOTE  Patient ID: Michael Garner MRN: [id number] DOB/AGE: 1956/01/11 64 y.o.  Admit date: 03/05/2020 Primary Physician   - Primary Cardiologist   Dr Curt Bears Chief Complaint    SOB  ASSESSMENT AND PLAN:   Acute flash pulmonary edema  - unclear etiology but suspect HTNsive emergency He probably has diastolic dysfunction and now CHF P. afib S/p Afib ablation (11/16) s/p DCCV x2 OSA- CPAP Obesity  Plan:  - admit to cardiology. In the ER- patient was anxious, wheezing, tachypneic, was on Bipap with FiO2 60%, got IV lasix 40mg  x1, became more anxious after that and HTNsive. Spoke to ER physician, he got ativan due to anxiety, BP 160/105-> we will start him on IV nitro gtt to control BP. Keep BP <120s.  - IV lasix 40mg  in am and reaccess  - continue Bipap for now - replaced K 3.3. recheck in am - given negative inotropic effect and pt in pulm edema- will hold off on diltiazem. Will switch him to toprol xl 25mg  daily - continue eliquis 5mg  BID  Full code -------------------------------------------------------------------------------------------------------  HPI:  64 year old male with a history of Persistent A. fib (on chronic Eliquis), BPH who just underwent Afib Ablation and got discharged presents with c/o acute onset of  shortness of breath.  The patient got discharged around 3.30pm yesterday after Afib ablation. He had s/p DCCV to NSR. After going home he felt throat irritation and wanted to clear the throat constantly, little after that started to feel SOB that was progressive in nature. He was walking to the daughter room and his daughter could appreciate wheezing and gugly sounds. They called EMS and en route was super anxious needing sedation and BIPAP. Patient denies any chest pain, dysphagia, gerd, abdominal pain. He has 2+ edema and did notice that.  ER work up: HTNSive 152/99, tachycardic 112, tachypneic 30, 98% on BIPAP (Fio2 60), k 3.3, cr 1.1, hb  15.6 EKG = sinus tachycardia CXR- pulm edema   EKG:  Sinus tachycardia, HR 107  Recent Cardiac procedures S/p PVI ablation 03/04/20 PROCEDURES: 1. Comprehensive electrophysiologic study. 2. Coronary sinus pacing and recording. 3. Three-dimensional mapping of atrial fibrillation (with additional mapping and ablation within the left atrium due to persistence of afib) 4. Ablation of atrial fibrillation (with additional mapping and ablation within the left atrium due to persistence of afib) 5. Intracardiac echocardiography. 6. Transseptal puncture of an intact septum. 7. Arrhythmia induction with pacing 8. External cardioversion.    Past Medical History:  Diagnosis Date  . A-fib ([place])   . BPH with urinary obstruction   . Erectile dysfunction   . Hepatitis B infection    resolved   . Pulmonary edema 03/05/2020    Past Surgical History:  Procedure Laterality Date  . CARDIOVERSION  05/16/2018  . COLONOSCOPY  07-18-11    per Dr. Sharlett Iles, diverticulosis only, repeat in 10 yrs   . LUMBAR MICRODISCECTOMY  2002    Allergies  Allergen Reactions  . Dextromethorphan-Guaifenesin Other (See Comments)    "feels like i'm having a heart attack"  . Penicillins     Not sure but immediate family members are allergic  . Prednisone Palpitations    Patient "put me in afib"   No current facility-administered medications on file prior to encounter.   Current Outpatient Medications on File Prior to Encounter  Medication Sig Dispense Refill  . apixaban (ELIQUIS) 5 MG TABS tablet Take 1 tablet (5 mg total) by mouth 2 (two) times daily. [place]  tablet 3  . diltiazem (CARDIZEM CD) 120 MG 24 hr capsule Take 1 capsule (120 mg total) by mouth daily. 30 capsule 3  . ibuprofen (ADVIL,MOTRIN) 400 MG tablet Take 400 mg by mouth daily as needed for mild pain or moderate pain (Lower pain).     . tadalafil (CIALIS) 20 MG tablet Take 1 tablet (20 mg total) by mouth as needed for erectile dysfunction. (Patient  taking differently: Take 20 mg by mouth daily as needed for erectile dysfunction. ) 10 tablet 11   Social History   Socioeconomic History  . Marital status: Divorced    Spouse name: Not on file  . Number of children: Not on file  . Years of education: Not on file  . Highest education level: Not on file  Occupational History  . Not on file  Tobacco Use  . Smoking status: Former Research scientist (life sciences)  . Smokeless tobacco: Never Used  Substance and Sexual Activity  . Alcohol use: Yes    Alcohol/week: 2.0 - 4.0 standard drinks    Types: 1 - 2 Glasses of wine, 1 - 2 Cans of beer per week  . Drug use: No  . Sexual activity: Not on file  Other Topics Concern  . Not on file  Social History Narrative  . Not on file   Social Determinants of Health   Financial Resource Strain:   . Difficulty of Paying Living Expenses: Not on file  Food Insecurity:   . Worried About Charity fundraiser in the Last Year: Not on file  . Ran Out of Food in the Last Year: Not on file  Transportation Needs:   . Lack of Transportation (Medical): Not on file  . Lack of Transportation (Non-Medical): Not on file  Physical Activity:   . Days of Exercise per Week: Not on file  . Minutes of Exercise per Session: Not on file  Stress:   . Feeling of Stress : Not on file  Social Connections:   . Frequency of Communication with Friends and Family: Not on file  . Frequency of Social Gatherings with Friends and Family: Not on file  . Attends Religious Services: Not on file  . Active Member of Clubs or Organizations: Not on file  . Attends Archivist Meetings: Not on file  . Marital Status: Not on file  Intimate Partner Violence:   . Fear of Current or Ex-Partner: Not on file  . Emotionally Abused: Not on file  . Physically Abused: Not on file  . Sexually Abused: Not on file    Family History  Problem Relation Age of Onset  . Alcohol abuse Father   . Lung cancer Father      Review of Systems: [y] = yes, [ ]  =  no       General: Weight gain [] ; Weight loss [ ] ; Anorexia [ ] ; Fatigue [ ] ; Fever [ ] ; Chills [ ] ; Weakness [ ]     Cardiac: Chest pain/pressure [ ] ; Resting SOB [ ] ; Exertional SOB [ ] ; Orthopnea [ ] ; Pedal Edema [ ] ; Palpitations [ ] ; Syncope [ ] ; Presyncope [ ] ; Paroxysmal nocturnal dyspnea[ ]     Pulmonary: Cough [x ]; Wheezing[ x]; Hemoptysis[ ] ; Sputum [ ] ; Snoring [ ]     GI: Vomiting[ ] ; Dysphagia[ ] ; Melena[ ] ; Hematochezia [ ] ; Heartburn[ ] ; Abdominal pain [ ] ; Constipation [ ] ; Diarrhea [ ] ; BRBPR [ ]     GU: Hematuria[ ] ; Dysuria [ ] ; Nocturia[ ]   Vascular: Pain in  legs with walking [ ] ; Pain in feet with lying flat [ ] ; Non-healing sores [ ] ; Stroke [ ] ; TIA [ ] ; Slurred speech [ ] ;    Neuro: Headaches[ ] ; Vertigo[ ] ; Seizures[ ] ; Paresthesias[ ] ;Blurred vision [ ] ; Diplopia [ ] ; Vision changes [ ]     Ortho/Skin: Arthritis [ ] ; Joint pain [ ] ; Muscle pain [ ] ; Joint swelling [ ] ; Back Pain [ ] ; Rash [ ]     Psych: Depression[ ] ; Anxiety[ ]     Heme: Bleeding problems [ ] ; Clotting disorders [ ] ; Anemia [ ]     Endocrine: Diabetes [ ] ; Thyroid dysfunction[ ]   Physical Exam: Blood pressure (!) 143/105, pulse 96, temperature 98.3 F (36.8 C), temperature source Axillary, resp. rate (!) 22, SpO2 97 %.   GENERAL: Patient is afebrile, Vital signs reviewed, Well appearing, Patient appears in respiratory distress wearing BIPAP EYES: Normal inspection. HEENT:  normocephalic, atraumatic , normal ENT inspection. ORAL:  Moist NECK:  supple , normal inspection. Difficult to see JVD due to thick neck CARD:  regular rate and rhythm, heart sounds normal. RESP:  B/l crackles and wheezing+ ABD: soft, non tender to palpation, BS present, soft, no organomegaly or masses . BACK: non-tender. No CVA tenderness. MUSC:  normal ROM, non-tender , 2+ pedal edema . SKIN: color normal, no rash, warm, dry . NEURO: awake & alert, lucid, no motor/sensory deficit. Gait stable. PSYCH: mood/affect  normal.   Labs: Lab Results  Component Value Date   BUN 29 (H) 03/05/2020   Lab Results  Component Value Date   CREATININE 1.10 03/05/2020   Lab Results  Component Value Date   NA 138 03/05/2020   K 3.3 (L) 03/05/2020   CL 104 03/05/2020   CO2 19 (L) 03/05/2020   No results found for: TROPONINI Lab Results  Component Value Date   WBC 12.0 (H) 03/05/2020   HGB 15.6 03/05/2020   HCT 46.0 03/05/2020   MCV 94.0 03/05/2020   PLT 198 03/05/2020   Lab Results  Component Value Date   CHOL 236 (H) 09/25/2017   HDL 88.30 09/25/2017   LDLCALC 131 (H) 09/25/2017   LDLDIRECT 128.0 05/08/2013   TRIG 81.0 09/25/2017   CHOLHDL 3 09/25/2017   Lab Results  Component Value Date   ALT 37 03/05/2020   AST 46 (H) 03/05/2020   ALKPHOS 66 03/05/2020   BILITOT 1.2 03/05/2020      Radiology:  CXR:  IMPRESSION: Cardiomegaly with findings suggestive of congestive heart failure with growing bilateral pleural effusions. There is bibasilar airspace disease favored to represent atelectasis with developing infiltrates not excluded.     Signed: Renae Fickle 03/05/2020, 3:03 AM

## 2020-03-05 NOTE — Progress Notes (Signed)
Transported pt from ED Rm16 to 3E02. No complications noted.

## 2020-03-05 NOTE — Consult Note (Signed)
Cardiology Consultation:   Patient ID: Connery Shiffler MRN: [id number]; DOB: 09/17/1955  Admit date: 03/05/2020 Date of Consult: 03/05/2020  Primary Care Provider: Laurey Morale, MD [facility] Cardiologist: No primary care provider on file.  [facility] Electrophysiologist:  Maeleigh Buschman Meredith Leeds, MD    Patient Profile:   Mitul Hallowell is a 64 y.o. male with a hx of AF who is being seen today for the evaluation of pulmonary edema at the request of Phillis Haggis.  History of Present Illness:   Mr. Commisso is a 64 year old male with a history of persistent atrial fibrillation and BPH.  He underwent atrial fibrillation ablation yesterday.  He was discharged around 330 or his ablation.  Throughout the evening, he felt throat irritation wanted to clear his throat constantly.  He did develop anxiety and shortness of breath.  He called EMS and was noted to be quite anxious needing sedation and required BiPAP.  In the emergency room, he was found to have pulmonary edema.  He was also found to be positive for RSV.  He feels much improved this morning.  He has less shortness of breath currently on BiPAP.  He is satting 99%.  He is at 1.3 L.  He is ready to get the BiPAP off.   Past Medical History:  Diagnosis Date  . A-fib ([place])   . BPH with urinary obstruction   . Erectile dysfunction   . Hepatitis B infection    resolved   . Pulmonary edema 03/05/2020    Past Surgical History:  Procedure Laterality Date  . CARDIOVERSION  05/16/2018  . COLONOSCOPY  07-18-11    per Dr. Sharlett Iles, diverticulosis only, repeat in 10 yrs   . LUMBAR MICRODISCECTOMY  2002     Home Medications:  Prior to Admission medications   Medication Sig Start Date End Date Taking? Authorizing Provider  apixaban (ELIQUIS) 5 MG TABS tablet Take 1 tablet (5 mg total) by mouth 2 (two) times daily. 01/15/20  Yes Fenton, Clint R, PA  diltiazem (CARDIZEM CD) 120 MG 24 hr capsule Take 1 capsule (120 mg total) by mouth  daily. 01/15/20  Yes Fenton, Clint R, PA  ibuprofen (ADVIL,MOTRIN) 400 MG tablet Take 400 mg by mouth daily as needed for mild pain or moderate pain (Lower pain).    Yes [provider]  tadalafil (CIALIS) 20 MG tablet Take 1 tablet (20 mg total) by mouth as needed for erectile dysfunction. Patient taking differently: Take 20 mg by mouth daily as needed for erectile dysfunction.  02/22/15 05/16/26 Yes Laurey Morale, MD    Inpatient Medications: Scheduled Meds: . apixaban  5 mg Oral BID  . furosemide  40 mg Intravenous BID  . metoprolol succinate  25 mg Oral Daily  . potassium chloride  20 mEq Oral Once  . potassium chloride  40 mEq Oral Once  . sodium chloride flush  3 mL Intravenous Q12H   Continuous Infusions: . nitroGLYCERIN 10 mcg/min (03/05/20 0419)  . potassium chloride     PRN Meds:   Allergies:    Allergies  Allergen Reactions  . Dextromethorphan-Guaifenesin Other (See Comments)    "feels like i'm having a heart attack"  . Penicillins     Not sure but immediate family members are allergic  . Prednisone Palpitations    Patient "put me in afib"    Social History:   Social History   Socioeconomic History  . Marital status: Divorced    Spouse name: Not on file  .  Number of children: Not on file  . Years of education: Not on file  . Highest education level: Not on file  Occupational History  . Not on file  Tobacco Use  . Smoking status: Former Research scientist (life sciences)  . Smokeless tobacco: Never Used  Substance and Sexual Activity  . Alcohol use: Yes    Alcohol/week: 2.0 - 4.0 standard drinks    Types: 1 - 2 Glasses of wine, 1 - 2 Cans of beer per week  . Drug use: No  . Sexual activity: Not on file  Other Topics Concern  . Not on file  Social History Narrative  . Not on file   Social Determinants of Health   Financial Resource Strain:   . Difficulty of Paying Living Expenses: Not on file  Food Insecurity:   . Worried About Charity fundraiser in the Last Year:  Not on file  . Ran Out of Food in the Last Year: Not on file  Transportation Needs:   . Lack of Transportation (Medical): Not on file  . Lack of Transportation (Non-Medical): Not on file  Physical Activity:   . Days of Exercise per Week: Not on file  . Minutes of Exercise per Session: Not on file  Stress:   . Feeling of Stress : Not on file  Social Connections:   . Frequency of Communication with Friends and Family: Not on file  . Frequency of Social Gatherings with Friends and Family: Not on file  . Attends Religious Services: Not on file  . Active Member of Clubs or Organizations: Not on file  . Attends Archivist Meetings: Not on file  . Marital Status: Not on file  Intimate Partner Violence:   . Fear of Current or Ex-Partner: Not on file  . Emotionally Abused: Not on file  . Physically Abused: Not on file  . Sexually Abused: Not on file    Family History:    Family History  Problem Relation Age of Onset  . Alcohol abuse Father   . Lung cancer Father      ROS:  Please see the history of present illness.   All other ROS reviewed and negative.     Physical Exam/Data:   Vitals:   03/05/20 0345 03/05/20 0350 03/05/20 0400 03/05/20 0510  BP: (!) 153/100 (!) 160/111 (!) 140/98 (!) 131/97  Pulse: (!) 110 (!) 109 (!) 114 99  Resp: (!) 23 (!) 22 20 (!) 23  Temp:    (!) 96.8 F (36 C)  TempSrc:    Axillary  SpO2: 97% 97% 98% 99%    Intake/Output Summary (Last 24 hours) at 03/05/2020 0801 Last data filed at 03/05/2020 0425 Gross per 24 hour  Intake --  Output 1300 ml  Net -1300 ml   Last 3 Weights 03/04/2020 01/21/2020 01/15/2020  Weight (lbs) 250 lb 248 lb 245 lb 3.2 oz  Weight (kg) 113.399 kg 112.492 kg 111.222 kg     There is no height or weight on file to calculate BMI.  General:  Well nourished, well developed, on BiPAP HEENT: normal Lymph: no adenopathy Neck: no JVD Endocrine:  No thryomegaly Vascular: No carotid bruits; FA pulses 2+  bilaterally without bruits  Cardiac:  normal S1, S2; tachycardic; no murmur  Lungs:  clear to auscultation bilaterally, no wheezing, rhonchi or rales  Abd: soft, nontender, no hepatomegaly  Ext: no edema Musculoskeletal:  No deformities, BUE and BLE strength normal and equal Skin: warm and dry  Neuro:  CNs 2-12 intact, no focal abnormalities noted Psych:  Normal affect   EKG:  The EKG was personally reviewed and demonstrates: Sinus tachycardia Telemetry:  Telemetry was personally reviewed and demonstrates: Sinus tachycardia  Relevant CV Studies: TTE 09/2018 1. The left ventricle has normal systolic function with an ejection  fraction of 60-65%. The cavity size was normal. Left ventricular diastolic  Doppler parameters are consistent with impaired relaxation. No evidence of  left ventricular regional wall  motion abnormalities.  2. The right ventricle has normal systolic function. The cavity was  normal. There is no increase in right ventricular wall thickness.  3. No evidence of mitral valve stenosis. No significant mitral  regurgitation.  4. The aortic valve is tricuspid. Mild calcification of the aortic valve.  No stenosis of the aortic valve.  5. The aortic root is normal in size and structure.  6. The IVC is normal in size. No complete TR doppler jet so unable to  estimate PA systolic pressure.   Laboratory Data:  High Sensitivity Troponin:   Recent Labs  Lab 03/05/20 0050 03/05/20 0340  TROPONINIHS <2 1,803*     Chemistry Recent Labs  Lab 03/05/20 0039 03/05/20 0039 03/05/20 0054 03/05/20 0056 03/05/20 0115  NA 137   < > 139 139 138  K 3.5   < > 3.8 3.8 3.3*  CL 105  --  104  --   --   CO2 19*  --   --   --   --   GLUCOSE 164*  --  156*  --   --   BUN 23  --  29*  --   --   CREATININE 1.19  --  1.10  --   --   CALCIUM 8.1*  --   --   --   --   GFRNONAA >60  --   --   --   --   ANIONGAP 13  --   --   --   --    < > = values in this interval not  displayed.    Recent Labs  Lab 03/05/20 0039  PROT 6.5  ALBUMIN 4.0  AST 46*  ALT 37  ALKPHOS 66  BILITOT 1.2   Hematology Recent Labs  Lab 03/05/20 0039 03/05/20 0039 03/05/20 0054 03/05/20 0056 03/05/20 0115  WBC 12.0*  --   --   --   --   RBC 5.18  --   --   --   --   HGB 15.8   < > 16.7 16.7 15.6  HCT 48.7   < > 49.0 49.0 46.0  MCV 94.0  --   --   --   --   MCH 30.5  --   --   --   --   MCHC 32.4  --   --   --   --   RDW 14.0  --   --   --   --   PLT 198  --   --   --   --    < > = values in this interval not displayed.   BNP Recent Labs  Lab 03/05/20 0039  BNP 342.8*    DDimer No results for input(s): DDIMER in the last 168 hours.   Radiology/Studies:  EP STUDY  Result Date: March 29, 2020 SURGEON:  Allegra Lai, MD PREPROCEDURE DIAGNOSES: 1. Persistent atrial fibrillation. POSTPROCEDURE DIAGNOSES: 1. Persistent atrial fibrillation. PROCEDURES: 1. Comprehensive electrophysiologic study. 2. Coronary sinus pacing and recording. 3.  Three-dimensional mapping of atrial fibrillation (with additional mapping and ablation within the left atrium due to persistence of afib) 4. Ablation of atrial fibrillation (with additional mapping and ablation within the left atrium due to persistence of afib) 5. Intracardiac echocardiography. 6. Transseptal puncture of an intact septum. 7. Arrhythmia induction with pacing 8. External cardioversion. INTRODUCTION:  Yacoub Diltz is a 64 y.o. male with a history of persistent atrial fibrillation who now presents for EP study and radiofrequency ablation.  The patient reports initially being diagnosed with atrial fibrillation after presenting with symptomatic palpitations and fatgiue.  The patient has failed medical therapy.  The patient therefore presents today for catheter ablation of atrial fibrillation. DESCRIPTION OF PROCEDURE:  Informed written consent was obtained, and the patient was brought to the electrophysiology lab in a fasting state.  The  patient was adequately sedated with intravenous medications as outlined in the anesthesia report.  The patient's left and right groins were prepped and draped in the usual sterile fashion by the EP lab staff.  Using a percutaneous Seldinger technique, two 7-French and one 11-French hemostasis sheaths were placed into the right common femoral vein.  An esophageal temperature probe was inserted to monitor for heating of the esophagus during the procedure. Direct ultrasound guidance is used for right and left femoral veins with normal vessel patency. Ultrasound images are captured and stored in the patient's chart. Using ultrasound guidance, the Brockenbrough needle and wire were visualized entering the vessel. Catheter Placement:  A 7-French Biosense Webster Decapolar coronary sinus catheter was introduced through the right common femoral vein and advanced into the coronary sinus for recording and pacing from this location.    Initial Measurements: The patient presented to the electrophysiology lab in atrial fibrillation.   The average RR interval measured 351 msec.   Intracardiac Echocardiography: A 10-French Biosense Webster AcuNav intracardiac echocardiography catheter was introduced through the right common femoral vein and advanced into the right atrium. Intracardiac echocardiography was performed of the left atrium, and a three-dimensional anatomical rendering of the left atrium was performed using CARTO sound technology.  The patient was noted to have a moderate sized left atrium.  The interatrial septum was prominent but not aneurysmal. All 4 pulmonary veins were visualized and noted to have separate ostia.  The pulmonary veins were moderate in size.  The left atrial appendage was visualized and did not reveal thrombus.   There was no evidence of pulmonary vein stenosis. Transseptal Puncture: The right common femoral vein sheaths were was exchanged for an 8.5 [place] Agillis transseptal sheath and transseptal  access was achieved in a standard fashion using a Brockenbrough needle under biplane fluoroscopy with intracardiac echocardiography confirmation of the transseptal puncture.  Once transseptal access had been achieved, heparin was administered intravenously and intra- arterially in order to maintain an ACT of greater than 350 seconds throughout the procedure.  3D Mapping and Ablation: A 3.5 mm Biosense [employer] Thermocool ablation catheter was advanced into the right atrium.  The transseptal sheath was pulled back into the IVC over a guidewire.  The ablation catheter was advanced across the transseptal hole using the wire as a guide.  The transseptal sheath was then re-advanced over the guidewire into the left atrium.  A duodecapolar Biosense Webster pentarray mapping catheter was introduced through the transseptal sheath and positioned over the mouth of all 4 pulmonary veins.  Three-dimensional electroanatomical mapping was performed using CARTO technology.  This demonstrated electrical activity within all four pulmonary veins at baseline.  The patient underwent successful sequential electrical isolation and anatomical encircling of all four pulmonary veins using radiofrequency current with a circular mapping catheter as a guide. A WACA approach was used. Due to persistence of atrial fibrillation, additional left atrial mapping and ablation was performed.  A series of radiofrequency lesions were delivered along the roof and floor of the left atrium in order to create a "standard box" lesion along the posterior wall of the left atrium. 20 mcg/kg/min of dobutamine was infused without arrhythmia induced. Cardioversion: The patient was then cardioverted to sinus rhythm with a single synchronized 250-J biphasic shock with cardioversion electrodes in the anterior-posterior thoracic configuration. he maintained sinus rhythm initially but then had recurence of afib with catheter manipulation within the left  atrial, requiring repeat cardioversion with 250J biphasic.  he remained in sinus rhythm thereafter. Measurements Following Ablation: In sinus rhythm the RR interval was 742msec, with PR 165 msec, QRS 78 msec, and QT 386 msec.  Following ablation the AH interval measured 57 msec with an HV interval of 50 msec. Ventricular pacing was performed, which revealed midline decremental VA conduction with a VA Wenckebach cycle length of 310 msec. Rapid atrial pacing was performed, which revealed an AV Wenckebach cycle length of 270 msec.  Electroisolation was then again confirmed in all four pulmonary veins. The procedure was therefore considered completed.  All catheters were removed, and the sheaths were aspirated and flushed.  The patient was transferred to the recovery area for sheath removal per protocol.  Intracardiac echocardiogram revealed no pericardial effusion. EBL<37ml.  There were no early apparent complications. CONCLUSIONS: 1. Atrial fibrillation upon presentation.  2. Successful electrical isolation and anatomical encircling of all four pulmonary veins with radiofrequency current.  A WACA approach was used 3. Additional left atrial ablation was performed with a standard box lesion created along the posterior wall of the left atrium 4. Atrial fibrillation successfully cardioverted to sinus rhythm. 5. No early apparent complications. Dealva Lafoy Hassell Done Lowana Hable,MD 10:51 AM 03/04/2020   DG Chest Portable 1 View  Result Date: 03/05/2020 CLINICAL DATA:  Worsening shortness of breath EXAM: PORTABLE CHEST 1 VIEW COMPARISON:  X-ray earlier in the same day FINDINGS: Again noted are findings of congestive heart failure with developing pulmonary edema. These findings are essentially unchanged from the patient's recent prior study. There is no pneumothorax. No acute osseous abnormality. IMPRESSION: Persistent congestive heart failure with pulmonary edema. Electronically Signed   By: Constance Holster M.D.   On: 03/05/2020  03:26   DG Chest Port 1 View  Result Date: 03/05/2020 CLINICAL DATA:  Shortness of breath EXAM: PORTABLE CHEST 1 VIEW COMPARISON:  05/16/2018 FINDINGS: Bibasilar airspace disease is noted. The heart size is enlarged. There are probable small to moderate-sized bilateral pleural effusions. There is no pneumothorax. No acute osseous abnormality. IMPRESSION: Cardiomegaly with findings suggestive of congestive heart failure with growing bilateral pleural effusions. There is bibasilar airspace disease favored to represent atelectasis with developing infiltrates not excluded. Electronically Signed   By: Constance Holster M.D.   On: 03/05/2020 01:18     Assessment and Plan:   1. Flash pulmonary edema: Status post A. fib ablation yesterday.  Could potentially be due to this, though he is RSV positive which likely is a complicating factor.  He had Lasix x1 and is now out 1.3 L.  We Ronn Smolinsky continue with diuresis today.  He has a plan for repeat echo.  He is currently on BiPAP, though I do feel that this Dartanion Teo be able  to be stopped later today. 2. Persistent atrial fibrillation: Status post atrial fibrillation ablation yesterday.  We Humphrey Guerreiro continue his Eliquis.  CHA2DS2-VASc of zero.  He remains in sinus rhythm.  We Okechukwu Regnier increase his Toprol-XL for improved rate control. 3. Hypokalemia: Plan potassium repletion. 4. RSV: Plan supportive respiratory care.  He is currently on droplet precautions.   For questions or updates, please contact [place] Please consult [email or web address] for contact info under    Signed, Travaughn Vue Meredith Leeds, MD  03/05/2020 8:01 AM

## 2020-03-06 DIAGNOSIS — G4733 Obstructive sleep apnea (adult) (pediatric): Secondary | ICD-10-CM | POA: Diagnosis present

## 2020-03-06 DIAGNOSIS — R0602 Shortness of breath: Secondary | ICD-10-CM

## 2020-03-06 DIAGNOSIS — N138 Other obstructive and reflux uropathy: Secondary | ICD-10-CM | POA: Diagnosis present

## 2020-03-06 DIAGNOSIS — J9601 Acute respiratory failure with hypoxia: Secondary | ICD-10-CM | POA: Diagnosis present

## 2020-03-06 DIAGNOSIS — I161 Hypertensive emergency: Secondary | ICD-10-CM | POA: Diagnosis present

## 2020-03-06 DIAGNOSIS — J210 Acute bronchiolitis due to respiratory syncytial virus: Secondary | ICD-10-CM | POA: Diagnosis present

## 2020-03-06 DIAGNOSIS — Z880 Allergy status to penicillin: Secondary | ICD-10-CM | POA: Diagnosis not present

## 2020-03-06 DIAGNOSIS — R000 Tachycardia, unspecified: Secondary | ICD-10-CM | POA: Diagnosis present

## 2020-03-06 DIAGNOSIS — E876 Hypokalemia: Secondary | ICD-10-CM | POA: Diagnosis present

## 2020-03-06 DIAGNOSIS — Z888 Allergy status to other drugs, medicaments and biological substances status: Secondary | ICD-10-CM | POA: Diagnosis not present

## 2020-03-06 DIAGNOSIS — I4819 Other persistent atrial fibrillation: Secondary | ICD-10-CM | POA: Diagnosis present

## 2020-03-06 DIAGNOSIS — N401 Enlarged prostate with lower urinary tract symptoms: Secondary | ICD-10-CM | POA: Diagnosis present

## 2020-03-06 DIAGNOSIS — E669 Obesity, unspecified: Secondary | ICD-10-CM | POA: Diagnosis present

## 2020-03-06 DIAGNOSIS — F419 Anxiety disorder, unspecified: Secondary | ICD-10-CM | POA: Diagnosis present

## 2020-03-06 DIAGNOSIS — I4892 Unspecified atrial flutter: Secondary | ICD-10-CM | POA: Diagnosis not present

## 2020-03-06 DIAGNOSIS — J90 Pleural effusion, not elsewhere classified: Secondary | ICD-10-CM | POA: Diagnosis present

## 2020-03-06 DIAGNOSIS — J129 Viral pneumonia, unspecified: Secondary | ICD-10-CM | POA: Diagnosis present

## 2020-03-06 DIAGNOSIS — Z20822 Contact with and (suspected) exposure to covid-19: Secondary | ICD-10-CM | POA: Diagnosis present

## 2020-03-06 DIAGNOSIS — J810 Acute pulmonary edema: Secondary | ICD-10-CM | POA: Diagnosis present

## 2020-03-06 DIAGNOSIS — Z87891 Personal history of nicotine dependence: Secondary | ICD-10-CM | POA: Diagnosis not present

## 2020-03-06 DIAGNOSIS — I5031 Acute diastolic (congestive) heart failure: Secondary | ICD-10-CM | POA: Diagnosis present

## 2020-03-06 DIAGNOSIS — F32A Depression, unspecified: Secondary | ICD-10-CM | POA: Diagnosis present

## 2020-03-06 DIAGNOSIS — Z6835 Body mass index (BMI) 35.0-35.9, adult: Secondary | ICD-10-CM | POA: Diagnosis not present

## 2020-03-06 DIAGNOSIS — I110 Hypertensive heart disease with heart failure: Secondary | ICD-10-CM | POA: Diagnosis present

## 2020-03-06 DIAGNOSIS — N529 Male erectile dysfunction, unspecified: Secondary | ICD-10-CM | POA: Diagnosis present

## 2020-03-06 LAB — BASIC METABOLIC PANEL
Anion gap: 11 (ref 5–15)
BUN: 15 mg/dL (ref 8–23)
CO2: 26 mmol/L (ref 22–32)
Calcium: 8.6 mg/dL — ABNORMAL LOW (ref 8.9–10.3)
Chloride: 98 mmol/L (ref 98–111)
Creatinine, Ser: 1.04 mg/dL (ref 0.61–1.24)
GFR, Estimated: >60 mL/min (ref >60)
Glucose, Bld: 108 mg/dL — ABNORMAL HIGH (ref 70–99)
Potassium: 3.7 mmol/L (ref 3.5–5.1)
Sodium: 135 mmol/L (ref 135–145)

## 2020-03-06 LAB — MAGNESIUM: Magnesium: 1.9 mg/dL (ref 1.7–2.4)

## 2020-03-06 LAB — CBC
HCT: 41.8 % (ref 39.0–52.0)
Hemoglobin: 13.7 g/dL (ref 13.0–17.0)
MCH: 29.8 pg (ref 26.0–34.0)
MCHC: 32.8 g/dL (ref 30.0–36.0)
MCV: 90.9 fL (ref 80.0–100.0)
Platelets: 139 10*3/uL — ABNORMAL LOW (ref 150–400)
RBC: 4.6 MIL/uL (ref 4.22–5.81)
RDW: 14.1 % (ref 11.5–15.5)
WBC: 9.1 10*3/uL (ref 4.0–10.5)
nRBC: 0 % (ref 0.0–0.2)

## 2020-03-06 LAB — PROCALCITONIN: Procalcitonin: <0.1 ng/mL

## 2020-03-06 MED ORDER — AMIODARONE LOAD VIA INFUSION
150.0000 mg | Freq: Once | INTRAVENOUS | Status: AC
  Administered 2020-03-06: 150 mg via INTRAVENOUS
  Filled 2020-03-06: qty 83.34

## 2020-03-06 MED ORDER — BENZONATATE 100 MG PO CAPS
200.0000 mg | ORAL_CAPSULE | Freq: Three times a day (TID) | ORAL | Status: DC | PRN
  Administered 2020-03-07 – 2020-03-08 (×5): 200 mg via ORAL
  Filled 2020-03-06 (×5): qty 2

## 2020-03-06 MED ORDER — DIGOXIN 0.25 MG/ML IJ SOLN
0.5000 mg | Freq: Every day | INTRAMUSCULAR | Status: AC
  Administered 2020-03-06: 0.5 mg via INTRAVENOUS
  Filled 2020-03-06: qty 2

## 2020-03-06 MED ORDER — ALBUTEROL SULFATE (2.5 MG/3ML) 0.083% IN NEBU: 2.5000 mg | INHALATION_SOLUTION | RESPIRATORY_TRACT | Status: DC | PRN

## 2020-03-06 MED ORDER — METOPROLOL TARTRATE 25 MG PO TABS
25.0000 mg | ORAL_TABLET | Freq: Once | ORAL | Status: AC
  Administered 2020-03-06: 25 mg via ORAL
  Filled 2020-03-06: qty 1

## 2020-03-06 MED ORDER — AMIODARONE HCL IN DEXTROSE 360-4.14 MG/200ML-% IV SOLN
30.0000 mg/h | INTRAVENOUS | Status: DC
  Administered 2020-03-06 (×2): 30 mg/h via INTRAVENOUS
  Filled 2020-03-06: qty 200

## 2020-03-06 MED ORDER — MELATONIN 3 MG PO TABS
6.0000 mg | ORAL_TABLET | Freq: Every day | ORAL | Status: DC
  Administered 2020-03-06 – 2020-03-08 (×3): 6 mg via ORAL
  Filled 2020-03-06 (×4): qty 2

## 2020-03-06 MED ORDER — OXYCODONE HCL 5 MG PO TABS: 5.0000 mg | ORAL_TABLET | ORAL | Status: DC | PRN

## 2020-03-06 MED ORDER — COLCHICINE 0.6 MG PO TABS
0.6000 mg | ORAL_TABLET | Freq: Once | ORAL | Status: AC
  Administered 2020-03-06: 0.6 mg via ORAL
  Filled 2020-03-06: qty 1

## 2020-03-06 MED ORDER — MORPHINE SULFATE (PF) 2 MG/ML IV SOLN: 2.0000 mg | INTRAVENOUS | Status: DC | PRN

## 2020-03-06 MED ORDER — HYDROCODONE-ACETAMINOPHEN 7.5-325 MG PO TABS
1.0000 | ORAL_TABLET | Freq: Four times a day (QID) | ORAL | Status: DC | PRN
  Administered 2020-03-06 (×3): 1 via ORAL
  Filled 2020-03-06 (×3): qty 1

## 2020-03-06 MED ORDER — LORAZEPAM 2 MG/ML IJ SOLN
1.0000 mg | INTRAMUSCULAR | Status: DC | PRN
  Administered 2020-03-08 (×3): 1 mg via INTRAVENOUS
  Filled 2020-03-06 (×3): qty 1

## 2020-03-06 MED ORDER — AMIODARONE HCL IN DEXTROSE 360-4.14 MG/200ML-% IV SOLN
60.0000 mg/h | INTRAVENOUS | Status: AC
  Administered 2020-03-06 (×2): 60 mg/h via INTRAVENOUS
  Filled 2020-03-06 (×2): qty 200

## 2020-03-06 MED ORDER — DIGOXIN 0.25 MG/ML IJ SOLN
0.2500 mg | Freq: Every day | INTRAMUSCULAR | Status: DC
  Administered 2020-03-07: 0.25 mg via INTRAVENOUS
  Filled 2020-03-06: qty 2

## 2020-03-06 NOTE — Progress Notes (Signed)
Cardiology brief note  Pt went back to AFIB RVR earlier this am Has cough and chest pain from It- gave hydrocodone  For Afib- unable to give increased metoprolol due to borderline BP -started IV digoxin 562mcg x1 and then will give additional 265mcg IV x1 in 6 hours - Consider antiarrhythmic meds -replace electrolytes  Renae Fickle, MD

## 2020-03-06 NOTE — Progress Notes (Signed)
New onset of afib RVR 160-180's around 0310. Pt c/o chest pain from coughing/congestion, SOB, and "feels like he has the flu".  Dr. Rudi Rummage notified. 25mg  PO metop and norco given with no change in heart rate.  5mg  dig given w/ decrease in HR to 130's-140's. Pt states his pain is resolved and he is starting to feel better.  Oxygen requirements increased from 2L [place] to 6L [place] for sats > 92%. Current VSS.

## 2020-03-06 NOTE — Progress Notes (Signed)
   03/06/20 0727  Assess: MEWS Score  Temp 98.5 F (36.9 C)  BP 97/71  Pulse Rate 88  ECG Heart Rate (!) 135  Resp 18  Level of Consciousness Alert  SpO2 93 %  O2 Device Nasal Cannula  O2 Flow Rate (L/min) 6 L/min  Assess: MEWS Score  MEWS Temp 0  MEWS Systolic 1  MEWS Pulse 3  MEWS RR 0  MEWS LOC 0  MEWS Score 4  MEWS Score Color Red  Assess: if the MEWS score is Yellow or Red  Were vital signs taken at a resting state? Yes  Focused Assessment No change from prior assessment  Early Detection of Sepsis Score *See Row Information* Low  MEWS guidelines implemented *See Row Information* No, previously red, continue vital signs every 4 hours  Treat  MEWS Interventions Administered scheduled meds/treatments  Pain Scale 0-10  Pain Score 3  Pain Type Acute pain  Pain Location Chest  Pain Intervention(s) Medication (See eMAR)  Document  Progress note created (see row info) Yes   Patient converted to RVR on night shift. Medications changed per rounding cardiology. Will begin new medications and continue q4hr vitals unless acute change

## 2020-03-06 NOTE — Progress Notes (Signed)
Patient will self place home CPAP unit when ready, RT assisted patient with setting up equipment.

## 2020-03-06 NOTE — Consult Note (Signed)
Medical Consultation   Michael Garner  KCL:[id number]  DOB: 1956/03/03  DOA: 03/05/2020  PCP: Laurey Morale, MD   Outpatient Specialists: [facility] - cardiology; Amalia Hailey - podiatry; Helane Rima - neurosurgery   Requesting physician: [facility] - cardilogy  Reason for consultation: S/p afib ablation, d/c same day.  Returned with flash pulmonary edema.  Did better but then increased O2 requirement and CT with possible PNA.   History of Present Illness: Michael Garner is an 64 y.o. male with h/o BPH; OSA; and atrial fibrillation s/p ablation and d/c on 11/26 who presented on 11/27 early AM with SOB concerning for acute flash pulmonary edema.  He reports that upon his return from from his ablation (11/16) and subsequent DCCV x 2 (11/26) he noted expiratory wheezing with mild SOB; he felt like there was a lot of mucous in his chest.  It progressed and his daughter was going to bring him to the ER but en route it became so bad that they had to call 911.  He was placed on BIPAP and given Versed with some improvement in his breathing.  He was given IV Lasix and was admitted by cardiology for flash pulmonary edema.  There was concern for HTN emergency with acute diastolic CHF.  He was started on NTG drip and given lasix.  This AM, he reports diffuse myalgias and fatigue with persistent SOB that is improved from presentation.  He denies sick contacts.  He reverted back to afib overnight with HR 150-160 during my evaluation.    Review of Systems:  ROS As per HPI otherwise 10 point review of systems negative.    Past Medical History: Past Medical History:  Diagnosis Date  . A-fib ([place])   . BPH with urinary obstruction   . Erectile dysfunction   . Hepatitis B infection    resolved   . Pulmonary edema 03/05/2020    Past Surgical History: Past Surgical History:  Procedure Laterality Date  . CARDIOVERSION  05/16/2018  . COLONOSCOPY  07-18-11    per Dr. Sharlett Iles, diverticulosis only, repeat  in 10 yrs   . LUMBAR MICRODISCECTOMY  2002     Allergies:   Allergies  Allergen Reactions  . Dextromethorphan-Guaifenesin Other (See Comments)    "feels like i'm having a heart attack"  . Penicillins     Not sure but immediate family members are allergic  . Prednisone Palpitations    Patient "put me in afib"     Social History:  reports that he has quit smoking. He has never used smokeless tobacco. He reports current alcohol use of about 2.0 - 4.0 standard drinks of alcohol per week. He reports that he does not use drugs.   Family History: Family History  Problem Relation Age of Onset  . Alcohol abuse Father   . Lung cancer Father       Physical Exam: Vitals:   03/06/20 0430 03/06/20 0727 03/06/20 0856 03/06/20 0904  BP: 106/89 97/71 94/68    Pulse: 100 88 (!) 162   Resp: (!) 23 18 20    Temp:  98.5 F (36.9 C)    TempSrc:  Oral    SpO2: 93% 93% 92% 93%  Weight:        Constitutional: Alert and awake, oriented x3, appears mildly to moderately ill with respiratory infection Eyes: EOMI, irises appear normal, anicteric sclera,  ENMT: external ears and nose appear normal, normal hearing, Lips appear  normal, oropharynx mucosa, tongue appear normal  Neck: neck appears normal, no masses, normal ROM CVS: Irregularly irregular with tachycardia to 150-10 range, trace LE edema Respiratory:  Coarse scattered rhonchi. Respiratory effort normal on [place] O2. Abdomen: soft nontender, nondistended Musculoskeletal: : no cyanosis, clubbing or edema noted bilaterally Neuro: Cranial nerves II-XII grossly intact Psych: judgement and insight appear normal, stable mood and affect, mental status Skin: no rashes or lesions or ulcers, no induration or nodules    Data reviewed:  I have personally reviewed the recent labs and imaging studies  Pertinent Labs:   Gucose 108 WBC 9.1 Platelets 139 BNP (11/27) 1803 11/27 RSV positive   Inpatient Medications:   Scheduled Meds: . apixaban   5 mg Oral BID  . digoxin  0.25 mg Intravenous Daily  . furosemide  40 mg Intravenous BID  . melatonin  6 mg Oral QHS  . metoprolol succinate  50 mg Oral Daily  . sodium chloride flush  3 mL Intravenous Q12H   Continuous Infusions: . amiodarone 60 mg/hr (03/06/20 0844)   Followed by  . amiodarone    . nitroGLYCERIN Stopped (03/05/20 1100)     Radiological Exams on Admission: CT ANGIO CHEST PE W OR WO CONTRAST  Result Date: 03/05/2020 CLINICAL DATA:  Shortness of breath.  Atrial fibrillation. EXAM: CT ANGIOGRAPHY CHEST WITH CONTRAST TECHNIQUE: Multidetector CT imaging of the chest was performed using the standard protocol during bolus administration of intravenous contrast. Multiplanar CT image reconstructions and MIPs were obtained to evaluate the vascular anatomy. CONTRAST:  60mL OMNIPAQUE IOHEXOL 350 MG/ML SOLN COMPARISON:  Chest radiograph March 05, 2020 FINDINGS: Cardiovascular: There is no demonstrable pulmonary embolus. The ascending thoracic aortic diameter measures 4.2 x 4 2 cm. No thoracic aortic dissection is evident. Note that the contrast bolus in the aorta is somewhat less than optimal for dissection assessment. The visualized great vessels appear normal. There is no appreciable pericardial effusion or pericardial thickening. There are scattered foci of coronary artery calcification. The main pulmonary outflow tract diameter is 3.6 cm, prominent. Heart mildly enlarged. Mediastinum/Nodes: Visualized thyroid appears unremarkable. There are scattered subcentimeter mediastinal lymph nodes. No frank adenopathy evident by size criteria. No esophageal lesions are appreciable. Lungs/Pleura: Moderate free-flowing pleural effusions are noted bilaterally. There is multifocal airspace opacity throughout the lungs bilaterally. There is consolidation in both lower lung regions, likely combination of pneumonia and compressive atelectasis. Upper Abdomen: Visualized upper abdominal structures  appear unremarkable. Musculoskeletal: There is degenerative change in the thoracic spine with areas of diffuse idiopathic skeletal hyperostosis. No blastic or lytic bone lesions are evident. No chest wall lesions. Review of the MIP images confirms the above findings. IMPRESSION: 1.  No demonstrable pulmonary embolus. 2. Ascending thoracic aortic prominence measuring 4.2 x 4.2 cm. Recommend annual imaging followup by CTA or MRA. This recommendation follows 2010 ACCF/AHA/AATS/ACR/ASA/SCA/SCAI/SIR/STS/SVM Guidelines for the Diagnosis and Management of Patients with Thoracic Aortic Disease. Circulation. 2010; 121: F026-V785. Aortic aneurysm NOS (ICD10-I71.9) no dissection evident. Occasional foci of coronary artery calcification noted. Heart mildly enlarged. 3. Prominence of the main pulmonary outflow tract, a finding indicative of a degree of pulmonary arterial hypertension. 4. Sizable free-flowing pleural effusions bilaterally. Combination of pneumonia and compressive atelectasis in each lower lobe. Multiple foci of airspace opacity throughout the lungs elsewhere may represent additional foci of pneumonia. These areas have an appearance suggesting atypical organism pneumonia. Correlation with COVID-19 status advised given this appearance. Note that a degree of superimposed congestive heart failure cannot be excluded. 5.  No adenopathy by size criteria. 6.  Diffuse idiopathic skeletal hyperostosis in the thoracic spine. Electronically Signed   By: Lowella Grip III M.D.   On: 03/05/2020 14:13   DG Chest Portable 1 View  Result Date: 03/05/2020 CLINICAL DATA:  Worsening shortness of breath EXAM: PORTABLE CHEST 1 VIEW COMPARISON:  X-ray earlier in the same day FINDINGS: Again noted are findings of congestive heart failure with developing pulmonary edema. These findings are essentially unchanged from the patient's recent prior study. There is no pneumothorax. No acute osseous abnormality. IMPRESSION: Persistent  congestive heart failure with pulmonary edema. Electronically Signed   By: Constance Holster M.D.   On: 03/05/2020 03:26   DG Chest Port 1 View  Result Date: 03/05/2020 CLINICAL DATA:  Shortness of breath EXAM: PORTABLE CHEST 1 VIEW COMPARISON:  05/16/2018 FINDINGS: Bibasilar airspace disease is noted. The heart size is enlarged. There are probable small to moderate-sized bilateral pleural effusions. There is no pneumothorax. No acute osseous abnormality. IMPRESSION: Cardiomegaly with findings suggestive of congestive heart failure with growing bilateral pleural effusions. There is bibasilar airspace disease favored to represent atelectasis with developing infiltrates not excluded. Electronically Signed   By: Constance Holster M.D.   On: 03/05/2020 01:18   ECHOCARDIOGRAM LIMITED  Result Date: 03/05/2020    ECHOCARDIOGRAM LIMITED REPORT   Patient Name:   JIOVANNY BURDELL Date of Exam: 03/05/2020 Medical Rec #:  [id number]   Height:       70.0 in Accession #:    [id number]  Weight:       250.0 lb Date of Birth:  Apr 20, 1955   BSA:          2.295 m Patient Age:    33 years    BP:           131/97 mmHg Patient Gender: M           HR:           99 bpm. Exam Location:  Inpatient Procedure: Limited Echo, Cardiac Doppler, Color Doppler and Intracardiac            Opacification Agent Indications:    CHF  History:        Patient has prior history of Echocardiogram examinations, most                 recent 10/02/2018. Arrythmias:Atrial Fibrillation; Risk                 Factors:Sleep Apnea.  Sonographer:    Clayton Lefort RDCS (AE) Referring Phys: [id number] Alpha  1. Left ventricular ejection fraction, by estimation, is 50 to 55%. The left ventricle has low normal function. The left ventricle has no regional wall motion abnormalities. The left ventricular internal cavity size was mildly dilated. There is mild concentric left ventricular hypertrophy. Left ventricular diastolic function could not be  evaluated. There is the interventricular septum is flattened in systole, consistent with right ventricular pressure overload.  2. Right ventricular systolic function is mildly reduced. The right ventricular size is mildly enlarged. There is moderately elevated pulmonary artery systolic pressure.  3. The mitral valve is normal in structure. Trivial mitral valve regurgitation. No evidence of mitral stenosis.  4. The aortic valve is normal in structure. Aortic valve regurgitation is not visualized. No aortic stenosis is present.  5. The inferior vena cava is dilated in size with <50% respiratory variability, suggesting right atrial pressure of 15 mmHg. Comparison(s): Prior images reviewed side by side. The right ventricular  systolic function is worse. Consider acute cor pulmonale (e.g. pulmonary embolism or acute respiratory failure). FINDINGS  Left Ventricle: Left ventricular ejection fraction, by estimation, is 50 to 55%. The left ventricle has low normal function. The left ventricle has no regional wall motion abnormalities. Definity contrast agent was given IV to delineate the left ventricular endocardial borders. The left ventricular internal cavity size was mildly dilated. There is mild concentric left ventricular hypertrophy. The interventricular septum is flattened in systole, consistent with right ventricular pressure overload. Left ventricular diastolic function could not be evaluated. Right Ventricle: The right ventricular size is mildly enlarged. Right vetricular wall thickness was not well visualized. Right ventricular systolic function is mildly reduced. There is moderately elevated pulmonary artery systolic pressure. The tricuspid  regurgitant velocity is 2.93 m/s, and with an assumed right atrial pressure of 15 mmHg, the estimated right ventricular systolic pressure is 03.5 mmHg. Left Atrium: Left atrial size was normal in size. Right Atrium: Right atrial size was normal in size. Pericardium: There is  no evidence of pericardial effusion. Mitral Valve: The mitral valve is normal in structure. Trivial mitral valve regurgitation. No evidence of mitral valve stenosis. Tricuspid Valve: The tricuspid valve is normal in structure. Tricuspid valve regurgitation is trivial. No evidence of tricuspid stenosis. Aortic Valve: The aortic valve is normal in structure. Aortic valve regurgitation is not visualized. No aortic stenosis is present. Pulmonic Valve: The pulmonic valve was normal in structure. Pulmonic valve regurgitation is not visualized. No evidence of pulmonic stenosis. Aorta: The aortic root is normal in size and structure. Venous: The inferior vena cava is dilated in size with less than 50% respiratory variability, suggesting right atrial pressure of 15 mmHg. IAS/Shunts: No atrial level shunt detected by color flow Doppler. LEFT VENTRICLE PLAX 2D LVIDd:         5.70 cm LVIDs:         4.30 cm LV PW:         1.40 cm LV IVS:        1.30 cm LVOT diam:     2.20 cm LVOT Area:     3.80 cm  LV Volumes (MOD) LV vol d, MOD A2C: 150.0 ml LV vol d, MOD A4C: 136.0 ml LV vol s, MOD A2C: 118.0 ml LV vol s, MOD A4C: 98.8 ml LV SV MOD A2C:     32.0 ml LV SV MOD A4C:     136.0 ml LV SV MOD BP:      40.9 ml IVC IVC diam: 2.20 cm LEFT ATRIUM         Index LA diam:    4.80 cm 2.09 cm/m   AORTA Ao Root diam: 3.50 cm Ao Asc diam:  3.30 cm TRICUSPID VALVE TR Peak grad:   34.3 mmHg TR Vmax:        293.00 cm/s  SHUNTS Systemic Diam: 2.20 cm Mihai Croitoru MD Electronically signed by Sanda Klein MD Signature Date/Time: 03/05/2020/12:33:59 PM    Final     Impression/Recommendations Principal Problem:   Acute pulmonary edema (HCC) Active Problems:   Paroxysmal atrial fibrillation (HCC)   RSV (acute bronchiolitis due to respiratory syncytial virus)   Acute pulmonary edema -Patient presented s/p ablation then DCCV x 2 for afib with acute and worsening SOB -This is likely multifactorial - due to afib with RVR, flash pulmonary  edema in the setting of possible diastolic CHF, HTN crisis, and acute RSV infection -Cardiology is managing the pulmonary edema/HTN crisis/afib  PAF -Patient has  refractory afib s/p ablation (11/16) and then DCCV x 2 (11/26) -His current respiratory infection is likely contributing to his persistent RVR -Will defer to cardiology  RSV -Possible PNA seen on imaging -Patient has positive RSV testing from the time of admission -Procalcitonin is negative, normal WBC count -Suspect symptoms are related to viral respiratory infection (in conjunction with other issues as above) -He does not appear to need antibiotics at this time -Supportive care with albuterol; supplemental O2; morphine/oxycodone as needed for pain; Tessalon as needed for cough; Ativan as needed for anxiety -There is mixed data on the efficacy of steroids with treatment of RSV but he may have some benefit from this therapy if ok from a cardiac standpoint; will hold for now     Thank you for this consultation.  Our [facility] hospitalist team will follow the patient with you.   Time Spent: 69 minutes  Karmen Bongo M.D. [facility] 03/06/2020, 10:51 AM

## 2020-03-06 NOTE — Progress Notes (Signed)
Progress Note  Patient Name: [name] Date of Encounter: 03/06/2020  [facility] Cardiologist: Curt Bears  Subjective   Patient went back into atrial fibrillation overnight.  His oxygen requirement also went up.  He developed more shortness of breath and chest pain when taking deep breaths.  He was given Norco which improved his chest pain.  Inpatient Medications    Scheduled Meds: . apixaban  5 mg Oral BID  . digoxin  0.25 mg Intravenous Daily  . furosemide  40 mg Intravenous BID  . melatonin  6 mg Oral QHS  . metoprolol succinate  50 mg Oral Daily  . sodium chloride flush  3 mL Intravenous Q12H   Continuous Infusions: . nitroGLYCERIN Stopped (03/05/20 1100)   PRN Meds: acetaminophen, HYDROcodone-acetaminophen   Vital Signs    Vitals:   03/06/20 0400 03/06/20 0423 03/06/20 0430 03/06/20 0727  BP: 98/61  106/89 97/71  Pulse: 70  100 88  Resp: (!) 22  (!) 23 18  Temp:  99.7 F (37.6 C)  98.5 F (36.9 C)  TempSrc:  Oral  Oral  SpO2: 94%  93% 93%  Weight:        Intake/Output Summary (Last 24 hours) at 03/06/2020 0742 Last data filed at 03/06/2020 0051 Gross per 24 hour  Intake 2123.43 ml  Output 3400 ml  Net -1276.57 ml   Last 3 Weights 03/06/2020 03/04/2020 01/21/2020  Weight (lbs) 248 lb 0.3 oz 250 lb 248 lb  Weight (kg) 112.5 kg 113.399 kg 112.492 kg      Telemetry    Atrial fibrillation- Personally Reviewed  ECG    None new- Personally Reviewed  Physical Exam   GEN: No acute distress.   Neck: No JVD Cardiac:  Irregular, no murmurs, rubs, or gallops.  Respiratory: Clear to auscultation bilaterally. GI: Soft, nontender, non-distended  MS: No edema; No deformity. Neuro:  Nonfocal  Psych: Normal affect   Labs    High Sensitivity Troponin:   Recent Labs  Lab 03/05/20 0050 03/05/20 0340  TROPONINIHS <2 1,803*      Chemistry Recent Labs  Lab 03/05/20 0039 03/05/20 0039 03/05/20 0054 03/05/20 0054 03/05/20 0056 03/05/20 0056  03/05/20 0115 03/05/20 1003 03/06/20 0416  NA 137   < > 139   < > 139  --  138  --  135  K 3.5   < > 3.8   < > 3.8   < > 3.3* 4.0 3.7  CL 105  --  104  --   --   --   --   --  98  CO2 19*  --   --   --   --   --   --   --  26  GLUCOSE 164*  --  156*  --   --   --   --   --  108*  BUN 23  --  29*  --   --   --   --   --  15  CREATININE 1.19  --  1.10  --   --   --   --   --  1.04  CALCIUM 8.1*  --   --   --   --   --   --   --  8.6*  PROT 6.5  --   --   --   --   --   --   --   --   ALBUMIN 4.0  --   --   --   --   --   --   --   --  AST 46*  --   --   --   --   --   --   --   --   ALT 37  --   --   --   --   --   --   --   --   ALKPHOS 66  --   --   --   --   --   --   --   --   BILITOT 1.2  --   --   --   --   --   --   --   --   GFRNONAA >60  --   --   --   --   --   --   --  >60  ANIONGAP 13  --   --   --   --   --   --   --  11   < > = values in this interval not displayed.     Hematology Recent Labs  Lab 03/05/20 0039 03/05/20 0054 03/05/20 0056 03/05/20 0115 03/06/20 0416  WBC 12.0*  --   --   --  9.1  RBC 5.18  --   --   --  4.60  HGB 15.8   < > 16.7 15.6 13.7  HCT 48.7   < > 49.0 46.0 41.8  MCV 94.0  --   --   --  90.9  MCH 30.5  --   --   --  29.8  MCHC 32.4  --   --   --  32.8  RDW 14.0  --   --   --  14.1  PLT 198  --   --   --  139*   < > = values in this interval not displayed.    BNP Recent Labs  Lab 03/05/20 0039  BNP 342.8*     DDimer No results for input(s): DDIMER in the last 168 hours.   Radiology    CT ANGIO CHEST PE W OR WO CONTRAST  Result Date: 03/05/2020 CLINICAL DATA:  Shortness of breath.  Atrial fibrillation. EXAM: CT ANGIOGRAPHY CHEST WITH CONTRAST TECHNIQUE: Multidetector CT imaging of the chest was performed using the standard protocol during bolus administration of intravenous contrast. Multiplanar CT image reconstructions and MIPs were obtained to evaluate the vascular anatomy. CONTRAST:  69mL OMNIPAQUE IOHEXOL 350 MG/ML SOLN  COMPARISON:  Chest radiograph March 05, 2020 FINDINGS: Cardiovascular: There is no demonstrable pulmonary embolus. The ascending thoracic aortic diameter measures 4.2 x 4 2 cm. No thoracic aortic dissection is evident. Note that the contrast bolus in the aorta is somewhat less than optimal for dissection assessment. The visualized great vessels appear normal. There is no appreciable pericardial effusion or pericardial thickening. There are scattered foci of coronary artery calcification. The main pulmonary outflow tract diameter is 3.6 cm, prominent. Heart mildly enlarged. Mediastinum/Nodes: Visualized thyroid appears unremarkable. There are scattered subcentimeter mediastinal lymph nodes. No frank adenopathy evident by size criteria. No esophageal lesions are appreciable. Lungs/Pleura: Moderate free-flowing pleural effusions are noted bilaterally. There is multifocal airspace opacity throughout the lungs bilaterally. There is consolidation in both lower lung regions, likely combination of pneumonia and compressive atelectasis. Upper Abdomen: Visualized upper abdominal structures appear unremarkable. Musculoskeletal: There is degenerative change in the thoracic spine with areas of diffuse idiopathic skeletal hyperostosis. No blastic or lytic bone lesions are evident. No chest wall lesions. Review of the MIP images confirms the above findings. IMPRESSION: 1.  No  demonstrable pulmonary embolus. 2. Ascending thoracic aortic prominence measuring 4.2 x 4.2 cm. Recommend annual imaging followup by CTA or MRA. This recommendation follows 2010 ACCF/AHA/AATS/ACR/ASA/SCA/SCAI/SIR/STS/SVM Guidelines for the Diagnosis and Management of Patients with Thoracic Aortic Disease. Circulation. 2010; 121: T035-W656. Aortic aneurysm NOS (ICD10-I71.9) no dissection evident. Occasional foci of coronary artery calcification noted. Heart mildly enlarged. 3. Prominence of the main pulmonary outflow tract, a finding indicative of a degree  of pulmonary arterial hypertension. 4. Sizable free-flowing pleural effusions bilaterally. Combination of pneumonia and compressive atelectasis in each lower lobe. Multiple foci of airspace opacity throughout the lungs elsewhere may represent additional foci of pneumonia. These areas have an appearance suggesting atypical organism pneumonia. Correlation with COVID-19 status advised given this appearance. Note that a degree of superimposed congestive heart failure cannot be excluded. 5.  No adenopathy by size criteria. 6.  Diffuse idiopathic skeletal hyperostosis in the thoracic spine. Electronically Signed   By: Lowella Grip III M.D.   On: 03/05/2020 14:13   EP STUDY  Result Date: 03/04/2020 SURGEON:  Allegra Lai, MD PREPROCEDURE DIAGNOSES: 1. Persistent atrial fibrillation. POSTPROCEDURE DIAGNOSES: 1. Persistent atrial fibrillation. PROCEDURES: 1. Comprehensive electrophysiologic study. 2. Coronary sinus pacing and recording. 3. Three-dimensional mapping of atrial fibrillation (with additional mapping and ablation within the left atrium due to persistence of afib) 4. Ablation of atrial fibrillation (with additional mapping and ablation within the left atrium due to persistence of afib) 5. Intracardiac echocardiography. 6. Transseptal puncture of an intact septum. 7. Arrhythmia induction with pacing 8. External cardioversion. INTRODUCTION:  Duvid Smalls is a 64 y.o. male with a history of persistent atrial fibrillation who now presents for EP study and radiofrequency ablation.  The patient reports initially being diagnosed with atrial fibrillation after presenting with symptomatic palpitations and fatgiue.  The patient has failed medical therapy.  The patient therefore presents today for catheter ablation of atrial fibrillation. DESCRIPTION OF PROCEDURE:  Informed written consent was obtained, and the patient was brought to the electrophysiology lab in a fasting state.  The patient was adequately sedated  with intravenous medications as outlined in the anesthesia report.  The patient's left and right groins were prepped and draped in the usual sterile fashion by the EP lab staff.  Using a percutaneous Seldinger technique, two 7-French and one 11-French hemostasis sheaths were placed into the right common femoral vein.  An esophageal temperature probe was inserted to monitor for heating of the esophagus during the procedure. Direct ultrasound guidance is used for right and left femoral veins with normal vessel patency. Ultrasound images are captured and stored in the patient's chart. Using ultrasound guidance, the Brockenbrough needle and wire were visualized entering the vessel. Catheter Placement:  A 7-French [employer] Decapolar coronary sinus catheter was introduced through the right common femoral vein and advanced into the coronary sinus for recording and pacing from this location.    Initial Measurements: The patient presented to the electrophysiology lab in atrial fibrillation.   The average RR interval measured 351 msec.   Intracardiac Echocardiography: A 10-French Biosense Webster AcuNav intracardiac echocardiography catheter was introduced through the right common femoral vein and advanced into the right atrium. Intracardiac echocardiography was performed of the left atrium, and a three-dimensional anatomical rendering of the left atrium was performed using CARTO sound technology.  The patient was noted to have a moderate sized left atrium.  The interatrial septum was prominent but not aneurysmal. All 4 pulmonary veins were visualized and noted to have separate ostia.  The pulmonary  veins were moderate in size.  The left atrial appendage was visualized and did not reveal thrombus.   There was no evidence of pulmonary vein stenosis. Transseptal Puncture: The right common femoral vein sheaths were was exchanged for an 8.5 [place] Agillis transseptal sheath and transseptal access was achieved in a  standard fashion using a Brockenbrough needle under biplane fluoroscopy with intracardiac echocardiography confirmation of the transseptal puncture.  Once transseptal access had been achieved, heparin was administered intravenously and intra- arterially in order to maintain an ACT of greater than 350 seconds throughout the procedure.  3D Mapping and Ablation: A 3.5 mm [employer] Thermocool ablation catheter was advanced into the right atrium.  The transseptal sheath was pulled back into the IVC over a guidewire.  The ablation catheter was advanced across the transseptal hole using the wire as a guide.  The transseptal sheath was then re-advanced over the guidewire into the left atrium.  A duodecapolar Biosense Webster pentarray mapping catheter was introduced through the transseptal sheath and positioned over the mouth of all 4 pulmonary veins.  Three-dimensional electroanatomical mapping was performed using CARTO technology.  This demonstrated electrical activity within all four pulmonary veins at baseline. The patient underwent successful sequential electrical isolation and anatomical encircling of all four pulmonary veins using radiofrequency current with a circular mapping catheter as a guide. A WACA approach was used. Due to persistence of atrial fibrillation, additional left atrial mapping and ablation was performed.  A series of radiofrequency lesions were delivered along the roof and floor of the left atrium in order to create a "standard box" lesion along the posterior wall of the left atrium. 20 mcg/kg/min of dobutamine was infused without arrhythmia induced. Cardioversion: The patient was then cardioverted to sinus rhythm with a single synchronized 250-J biphasic shock with cardioversion electrodes in the anterior-posterior thoracic configuration. he maintained sinus rhythm initially but then had recurence of afib with catheter manipulation within the left atrial, requiring repeat  cardioversion with 250J biphasic.  he remained in sinus rhythm thereafter. Measurements Following Ablation: In sinus rhythm the RR interval was 722msec, with PR 165 msec, QRS 78 msec, and QT 386 msec.  Following ablation the AH interval measured 57 msec with an HV interval of 50 msec. Ventricular pacing was performed, which revealed midline decremental VA conduction with a VA Wenckebach cycle length of 310 msec. Rapid atrial pacing was performed, which revealed an AV Wenckebach cycle length of 270 msec.  Electroisolation was then again confirmed in all four pulmonary veins. The procedure was therefore considered completed.  All catheters were removed, and the sheaths were aspirated and flushed.  The patient was transferred to the recovery area for sheath removal per protocol.  Intracardiac echocardiogram revealed no pericardial effusion. EBL<8ml.  There were no early apparent complications. CONCLUSIONS: 1. Atrial fibrillation upon presentation.  2. Successful electrical isolation and anatomical encircling of all four pulmonary veins with radiofrequency current.  A WACA approach was used 3. Additional left atrial ablation was performed with a standard box lesion created along the posterior wall of the left atrium 4. Atrial fibrillation successfully cardioverted to sinus rhythm. 5. No early apparent complications. Deloyd Handy Hassell Done Ambria Mayfield,MD 10:51 AM 03/04/2020   DG Chest Portable 1 View  Result Date: 03/05/2020 CLINICAL DATA:  Worsening shortness of breath EXAM: PORTABLE CHEST 1 VIEW COMPARISON:  X-ray earlier in the same day FINDINGS: Again noted are findings of congestive heart failure with developing pulmonary edema. These findings are essentially unchanged from the patient's  recent prior study. There is no pneumothorax. No acute osseous abnormality. IMPRESSION: Persistent congestive heart failure with pulmonary edema. Electronically Signed   By: Constance Holster M.D.   On: 03/05/2020 03:26   DG Chest Port 1  View  Result Date: 03/05/2020 CLINICAL DATA:  Shortness of breath EXAM: PORTABLE CHEST 1 VIEW COMPARISON:  05/16/2018 FINDINGS: Bibasilar airspace disease is noted. The heart size is enlarged. There are probable small to moderate-sized bilateral pleural effusions. There is no pneumothorax. No acute osseous abnormality. IMPRESSION: Cardiomegaly with findings suggestive of congestive heart failure with growing bilateral pleural effusions. There is bibasilar airspace disease favored to represent atelectasis with developing infiltrates not excluded. Electronically Signed   By: Constance Holster M.D.   On: 03/05/2020 01:18   ECHOCARDIOGRAM LIMITED  Result Date: 03/05/2020    ECHOCARDIOGRAM LIMITED REPORT   Patient Name:   JALIK GELLATLY Date of Exam: 03/05/2020 Medical Rec #:  [id number]   Height:       70.0 in Accession #:    [id number]  Weight:       250.0 lb Date of Birth:  1955/12/01   BSA:          2.295 m Patient Age:    54 years    BP:           131/97 mmHg Patient Gender: M           HR:           99 bpm. Exam Location:  Inpatient Procedure: Limited Echo, Cardiac Doppler, Color Doppler and Intracardiac            Opacification Agent Indications:    CHF  History:        Patient has prior history of Echocardiogram examinations, most                 recent 10/02/2018. Arrythmias:Atrial Fibrillation; Risk                 Factors:Sleep Apnea.  Sonographer:    Clayton Lefort RDCS (AE) Referring Phys: [id number] [place]  1. Left ventricular ejection fraction, by estimation, is 50 to 55%. The left ventricle has low normal function. The left ventricle has no regional wall motion abnormalities. The left ventricular internal cavity size was mildly dilated. There is mild concentric left ventricular hypertrophy. Left ventricular diastolic function could not be evaluated. There is the interventricular septum is flattened in systole, consistent with right ventricular pressure overload.  2. Right ventricular  systolic function is mildly reduced. The right ventricular size is mildly enlarged. There is moderately elevated pulmonary artery systolic pressure.  3. The mitral valve is normal in structure. Trivial mitral valve regurgitation. No evidence of mitral stenosis.  4. The aortic valve is normal in structure. Aortic valve regurgitation is not visualized. No aortic stenosis is present.  5. The inferior vena cava is dilated in size with <50% respiratory variability, suggesting right atrial pressure of 15 mmHg. Comparison(s): Prior images reviewed side by side. The right ventricular systolic function is worse. Consider acute cor pulmonale (e.g. pulmonary embolism or acute respiratory failure). FINDINGS  Left Ventricle: Left ventricular ejection fraction, by estimation, is 50 to 55%. The left ventricle has low normal function. The left ventricle has no regional wall motion abnormalities. Definity contrast agent was given IV to delineate the left ventricular endocardial borders. The left ventricular internal cavity size was mildly dilated. There is mild concentric left ventricular hypertrophy. The interventricular septum is flattened in systole, consistent  with right ventricular pressure overload. Left ventricular diastolic function could not be evaluated. Right Ventricle: The right ventricular size is mildly enlarged. Right vetricular wall thickness was not well visualized. Right ventricular systolic function is mildly reduced. There is moderately elevated pulmonary artery systolic pressure. The tricuspid  regurgitant velocity is 2.93 m/s, and with an assumed right atrial pressure of 15 mmHg, the estimated right ventricular systolic pressure is 43.1 mmHg. Left Atrium: Left atrial size was normal in size. Right Atrium: Right atrial size was normal in size. Pericardium: There is no evidence of pericardial effusion. Mitral Valve: The mitral valve is normal in structure. Trivial mitral valve regurgitation. No evidence of mitral  valve stenosis. Tricuspid Valve: The tricuspid valve is normal in structure. Tricuspid valve regurgitation is trivial. No evidence of tricuspid stenosis. Aortic Valve: The aortic valve is normal in structure. Aortic valve regurgitation is not visualized. No aortic stenosis is present. Pulmonic Valve: The pulmonic valve was normal in structure. Pulmonic valve regurgitation is not visualized. No evidence of pulmonic stenosis. Aorta: The aortic root is normal in size and structure. Venous: The inferior vena cava is dilated in size with less than 50% respiratory variability, suggesting right atrial pressure of 15 mmHg. IAS/Shunts: No atrial level shunt detected by color flow Doppler. LEFT VENTRICLE PLAX 2D LVIDd:         5.70 cm LVIDs:         4.30 cm LV PW:         1.40 cm LV IVS:        1.30 cm LVOT diam:     2.20 cm LVOT Area:     3.80 cm  LV Volumes (MOD) LV vol d, MOD A2C: 150.0 ml LV vol d, MOD A4C: 136.0 ml LV vol s, MOD A2C: 118.0 ml LV vol s, MOD A4C: 98.8 ml LV SV MOD A2C:     32.0 ml LV SV MOD A4C:     136.0 ml LV SV MOD BP:      40.9 ml IVC IVC diam: 2.20 cm LEFT ATRIUM         Index LA diam:    4.80 cm 2.09 cm/m   AORTA Ao Root diam: 3.50 cm Ao Asc diam:  3.30 cm TRICUSPID VALVE TR Peak grad:   34.3 mmHg TR Vmax:        293.00 cm/s  SHUNTS Systemic Diam: 2.20 cm Sanda Klein MD Electronically signed by Sanda Klein MD Signature Date/Time: 03/05/2020/12:33:59 PM    Final     Cardiac Studies   TTE 03/05/20 1. Left ventricular ejection fraction, by estimation, is 50 to 55%. The  left ventricle has low normal function. The left ventricle has no regional  wall motion abnormalities. The left ventricular internal cavity size was  mildly dilated. There is mild  concentric left ventricular hypertrophy. Left ventricular diastolic  function could not be evaluated. There is the interventricular septum is  flattened in systole, consistent with right ventricular pressure overload.  2. Right  ventricular systolic function is mildly reduced. The right  ventricular size is mildly enlarged. There is moderately elevated  pulmonary artery systolic pressure.  3. The mitral valve is normal in structure. Trivial mitral valve  regurgitation. No evidence of mitral stenosis.  4. The aortic valve is normal in structure. Aortic valve regurgitation is  not visualized. No aortic stenosis is present.  5. The inferior vena cava is dilated in size with <50% respiratory  variability, suggesting right atrial pressure of 15 mmHg.  Patient Profile     64 y.o. male with a history of atrial fibrillation status post ablation who he presented to the hospital with shortness of breath.  Assessment & Plan    1.  Persistent atrial fibrillation: Patient unfortunately went back into atrial fibrillation.  Unfortunately his blood pressure is low today and he is in the 120s to 150s.  I Amantha Sklar thus start him on an amiodarone drip.  He remains anticoagulated with Eliquis.  2.  Pulmonary edema: Found on chest x-ray and CT scan.  He has been diuresed with Lasix.  His creatinine remained stable.  He is out 2.5 L.  Unfortunately his oxygen requirements have gone up.  He does appear to be euvolemic at this time.  3.  Pneumonia: Found on a CT scan.  He is RSV positive and this could be a viral pneumonia.  Minus a suspicion for bacterial pneumonia is low.  Adeleigh Barletta consult medicine for further assistance.  4.  Chest pain: Pain with taking a deep breath appears to be pleuritic in nature.  He did receive Norco last night.  We Seva Chancy try a dose of colchicine to see if this improves his symptoms.  For questions or updates, please contact [place] Please consult [email or web address] for contact info under        Signed, Reis Pienta Meredith Leeds, MD  03/06/2020, 7:42 AM

## 2020-03-07 ENCOUNTER — Inpatient Hospital Stay (HOSPITAL_COMMUNITY): Payer: 59

## 2020-03-07 ENCOUNTER — Encounter (HOSPITAL_COMMUNITY): Payer: Self-pay | Admitting: Cardiology

## 2020-03-07 DIAGNOSIS — J810 Acute pulmonary edema: Secondary | ICD-10-CM | POA: Diagnosis not present

## 2020-03-07 DIAGNOSIS — J210 Acute bronchiolitis due to respiratory syncytial virus: Secondary | ICD-10-CM

## 2020-03-07 LAB — BASIC METABOLIC PANEL
Anion gap: 12 (ref 5–15)
BUN: 17 mg/dL (ref 8–23)
CO2: 24 mmol/L (ref 22–32)
Calcium: 8.1 mg/dL — ABNORMAL LOW (ref 8.9–10.3)
Chloride: 97 mmol/L — ABNORMAL LOW (ref 98–111)
Creatinine, Ser: 0.95 mg/dL (ref 0.61–1.24)
GFR, Estimated: >60 mL/min (ref >60)
Glucose, Bld: 102 mg/dL — ABNORMAL HIGH (ref 70–99)
Potassium: 3.2 mmol/L — ABNORMAL LOW (ref 3.5–5.1)
Sodium: 133 mmol/L — ABNORMAL LOW (ref 135–145)

## 2020-03-07 LAB — PROCALCITONIN: Procalcitonin: <0.1 ng/mL

## 2020-03-07 LAB — MAGNESIUM: Magnesium: 1.8 mg/dL (ref 1.7–2.4)

## 2020-03-07 MED ORDER — POTASSIUM CHLORIDE CRYS ER 20 MEQ PO TBCR
40.0000 meq | EXTENDED_RELEASE_TABLET | Freq: Once | ORAL | Status: AC
  Administered 2020-03-07: 40 meq via ORAL
  Filled 2020-03-07: qty 2

## 2020-03-07 MED ORDER — AMIODARONE HCL 200 MG PO TABS
200.0000 mg | ORAL_TABLET | Freq: Two times a day (BID) | ORAL | Status: DC
  Administered 2020-03-07 (×2): 200 mg via ORAL
  Filled 2020-03-07 (×2): qty 1

## 2020-03-07 MED ORDER — DOCUSATE SODIUM 50 MG PO CAPS
50.0000 mg | ORAL_CAPSULE | Freq: Every day | ORAL | Status: DC | PRN
  Administered 2020-03-07 – 2020-03-08 (×2): 50 mg via ORAL
  Filled 2020-03-07 (×5): qty 1

## 2020-03-07 MED ORDER — FUROSEMIDE 10 MG/ML IJ SOLN
40.0000 mg | Freq: Every day | INTRAMUSCULAR | Status: AC
  Administered 2020-03-07: 40 mg via INTRAVENOUS
  Filled 2020-03-07: qty 4

## 2020-03-07 NOTE — Progress Notes (Signed)
Hospitalist Consult Progress Note   Michael Garner  PRF:[id number]  DOB: 1955/09/07  DOA: 03/05/2020  PCP: Laurey Morale, MD  Outpatient Specialists: [facility] - cardiology; Amalia Hailey - podiatry; Helane Rima - neurosurgery  Requesting physician: [facility] - cardilogy  Reason for consultation: S/p afib ablation, d/c same day.  Returned with flash pulmonary edema.  Did better but then increased O2 requirement and CT with possible PNA. Swab positive for RSV.     History of Present Illness: Michael Garner is an 64 y.o. male with h/o BPH, OSA, and atrial fibrillation s/p ablation and d/c on 11/26 who presented on 11/27 early AM with SOB concerning for acute flash pulmonary edema.  He reported that upon his return from from his ablation (11/16) and subsequent DCCV x 2 (11/26) he noted expiratory wheezing with mild SOB; he felt like there was a lot of mucous in his chest.   This progressed to the point of coming back to the ER. He was placed on BIPAP and given Versed with some improvement in his breathing.  He was given IV Lasix and was admitted by cardiology for flash pulmonary edema.  There was concern for HTN emergency with acute diastolic CHF.  He was started on NTG drip and given lasix.   He had reverted back to afib with RVR (150s). He also denied any known sick contacts prior to admission.   On workup he was found to be positive for RSV and medicine was asked to see patient in consult.    Review of Systems: Review of Systems - General ROS: positive for  - fatigue Respiratory ROS: positive for - cough Cardiovascular ROS: negative for - chest pain Gastrointestinal ROS: negative for - abdominal pain   Past Medical History: Past Medical History:  Diagnosis Date  . A-fib ([place])   . BPH with urinary obstruction   . Erectile dysfunction   . Hepatitis B infection    resolved   . Pulmonary edema 03/05/2020    Past Surgical History: Past Surgical History:  Procedure Laterality Date    . ATRIAL FIBRILLATION ABLATION N/A 03/04/2020   Procedure: ATRIAL FIBRILLATION ABLATION;  Surgeon: [name], MD;  Location: [facility];  Service: Cardiovascular;  Laterality: N/A;  . CARDIOVERSION  05/16/2018  . COLONOSCOPY  07-18-11    per Dr. Sharlett Iles, diverticulosis only, repeat in 10 yrs   . LUMBAR MICRODISCECTOMY  2002    Allergies:   Allergies  Allergen Reactions  . Dextromethorphan-Guaifenesin Other (See Comments)    "feels like i'm having a heart attack"  . Penicillins     Not sure but immediate family members are allergic  . Prednisone Palpitations    Patient "put me in afib"    Social History:  reports that he has quit smoking. He has never used smokeless tobacco. He reports current alcohol use of about 2.0 - 4.0 standard drinks of alcohol per week. He reports that he does not use drugs.  Family History: Family History  Problem Relation Age of Onset  . Alcohol abuse Father   . Lung cancer Father     Objective: Physical Exam: Vitals:   03/06/20 1604 03/06/20 1940 03/07/20 0343 03/07/20 0345  BP: 116/82 109/69 101/83   Pulse:  80 73   Resp: 20 20 19    Temp: 98.9 F (37.2 C) 99.2 F (37.3 C) 98 F (36.7 C)   TempSrc: Oral Oral Oral  SpO2: 96% 99% 97%   Weight:    111.9 kg   General appearance: alert, cooperative and no distress Head: Normocephalic, without obvious abnormality, atraumatic Eyes: EOMI Lungs: diffuse coarse scattered breath sounds, no obvious wheezing Heart: regular rate and rhythm and S1, S2 normal Abdomen: normal findings: bowel sounds normal Extremities: no edema Skin: mobility and turgor normal Neurologic: Grossly normal  Data reviewed:  I have personally reviewed following labs and imaging studies Results for orders placed or performed during the hospital encounter of 03/05/20 (from the past 48 hour(s))  Basic metabolic panel     Status: Abnormal   Collection Time: 03/06/20  4:16 AM  Result Value Ref Range    Sodium 135 135 - 145 mmol/L   Potassium 3.7 3.5 - 5.1 mmol/L   Chloride 98 98 - 111 mmol/L   CO2 26 22 - 32 mmol/L   Glucose, Bld 108 (H) 70 - 99 mg/dL    Comment: Glucose reference range applies only to samples taken after fasting for at least 8 hours.   BUN 15 8 - 23 mg/dL   Creatinine, Ser 1.04 0.61 - 1.24 mg/dL   Calcium 8.6 (L) 8.9 - 10.3 mg/dL   GFR, Estimated >60 >60 mL/min    Comment: (NOTE) Calculated using the CKD-EPI Creatinine Equation (2021)    Anion gap 11 5 - 15    Comment: Performed at [facility] [address], [place], [place] [address]  Magnesium     Status: None   Collection Time: 03/06/20  4:16 AM  Result Value Ref Range   Magnesium 1.9 1.7 - 2.4 mg/dL    Comment: Performed at [facility] [address] [place], [place] [address]  CBC     Status: Abnormal   Collection Time: 03/06/20  4:16 AM  Result Value Ref Range   WBC 9.1 4.0 - 10.5 K/uL   RBC 4.60 4.22 - 5.81 MIL/uL   Hemoglobin 13.7 13.0 - 17.0 g/dL   HCT 41.8 39 - 52 %   MCV 90.9 80.0 - 100.0 fL   MCH 29.8 26.0 - 34.0 pg   MCHC 32.8 30.0 - 36.0 g/dL   RDW 14.1 11.5 - 15.5 %   Platelets 139 (L) 150 - 400 K/uL   nRBC 0.0 0.0 - 0.2 %    Comment: Performed at [facility], [place] [address], [place],  [address]  Procalcitonin - Baseline     Status: None   Collection Time: 03/06/20  4:49 AM  Result Value Ref Range   Procalcitonin <0.10 ng/mL    Comment:        Interpretation: PCT (Procalcitonin) <= 0.5 ng/mL: Systemic infection (sepsis) is not likely. Local bacterial infection is possible. (NOTE)       Sepsis PCT Algorithm           Lower Respiratory Tract                                      Infection PCT Algorithm    ----------------------------     ----------------------------         PCT < 0.25 ng/mL                PCT < 0.10 ng/mL          Strongly encourage             Strongly discourage  discontinuation of antibiotics    initiation of antibiotics     ----------------------------     -----------------------------       PCT 0.25 - 0.50 ng/mL            PCT 0.10 - 0.25 ng/mL               OR       >80% decrease in PCT            Discourage initiation of                                            antibiotics      Encourage discontinuation           of antibiotics    ----------------------------     -----------------------------         PCT >= 0.50 ng/mL              PCT 0.26 - 0.50 ng/mL               AND        <80% decrease in PCT             Encourage initiation of                                             antibiotics       Encourage continuation           of antibiotics    ----------------------------     -----------------------------        PCT >= 0.50 ng/mL                  PCT > 0.50 ng/mL               AND         increase in PCT                  Strongly encourage                                      initiation of antibiotics    Strongly encourage escalation           of antibiotics                                     -----------------------------                                           PCT <= 0.25 ng/mL                                                 OR                                        >  80% decrease in PCT                                      Discontinue / Do not initiate                                             antibiotics  Performed at [facility], [place] [address], [place], [place] [address]   Basic metabolic panel     Status: Abnormal   Collection Time: 03/07/20  2:40 AM  Result Value Ref Range   Sodium 133 (L) 135 - 145 mmol/L   Potassium 3.2 (L) 3.5 - 5.1 mmol/L   Chloride 97 (L) 98 - 111 mmol/L   CO2 24 22 - 32 mmol/L   Glucose, Bld 102 (H) 70 - 99 mg/dL    Comment: Glucose reference range applies only to samples taken after fasting for at least 8 hours.   BUN 17 8 - 23 mg/dL   Creatinine, Ser 0.95 0.61 - 1.24 mg/dL   Calcium 8.1 (L) 8.9 - 10.3 mg/dL   GFR, Estimated >60 >60 mL/min     Comment: (NOTE) Calculated using the CKD-EPI Creatinine Equation (2021)    Anion gap 12 5 - 15    Comment: Performed at [facility] [address] [place], [place] [address]  Magnesium     Status: None   Collection Time: 03/07/20  2:40 AM  Result Value Ref Range   Magnesium 1.8 1.7 - 2.4 mg/dL    Comment: Performed at [facility] [address] [place],  [address]  Procalcitonin     Status: None   Collection Time: 03/07/20  2:40 AM  Result Value Ref Range   Procalcitonin <0.10 ng/mL    Comment:        Interpretation: PCT (Procalcitonin) <= 0.5 ng/mL: Systemic infection (sepsis) is not likely. Local bacterial infection is possible. (NOTE)       Sepsis PCT Algorithm           Lower Respiratory Tract                                      Infection PCT Algorithm    ----------------------------     ----------------------------         PCT < 0.25 ng/mL                PCT < 0.10 ng/mL          Strongly encourage             Strongly discourage   discontinuation of antibiotics    initiation of antibiotics    ----------------------------     -----------------------------       PCT 0.25 - 0.50 ng/mL            PCT 0.10 - 0.25 ng/mL               OR       >80% decrease in PCT            Discourage initiation of  antibiotics      Encourage discontinuation           of antibiotics    ----------------------------     -----------------------------         PCT >= 0.50 ng/mL              PCT 0.26 - 0.50 ng/mL               AND        <80% decrease in PCT             Encourage initiation of                                             antibiotics       Encourage continuation           of antibiotics    ----------------------------     -----------------------------        PCT >= 0.50 ng/mL                  PCT > 0.50 ng/mL               AND         increase in PCT                  Strongly encourage                                       initiation of antibiotics    Strongly encourage escalation           of antibiotics                                     -----------------------------                                           PCT <= 0.25 ng/mL                                                 OR                                        > 80% decrease in PCT                                      Discontinue / Do not initiate                                             antibiotics  Performed at [facility], [place] [address], [place], [place] [address]     Labs:  CBC: Recent Labs  Lab 03/05/20 [phone]  03/05/20 0054 03/05/20 0056 03/05/20 0115 03/06/20 0416  WBC 12.0*  --   --   --  9.1  NEUTROABS 9.1*  --   --   --   --   HGB 15.8 16.7 16.7 15.6 13.7  HCT 48.7 49.0 49.0 46.0 41.8  MCV 94.0  --   --   --  90.9  PLT 198  --   --   --  139*    Basic Metabolic Panel: Recent Labs  Lab 03/05/20 0039 03/05/20 0039 03/05/20 0054 03/05/20 0054 03/05/20 0056 03/05/20 0056 03/05/20 0115 03/05/20 0340 03/05/20 1003 03/06/20 0416 03/07/20 0240  NA 137   < > 139  --  139  --  138  --   --  135 133*  K 3.5   < > 3.8   < > 3.8   < > 3.3*  --    < > 3.7 3.2*  CL 105  --  104  --   --   --   --   --   --  98 97*  CO2 19*  --   --   --   --   --   --   --   --  26 24  GLUCOSE 164*  --  156*  --   --   --   --   --   --  108* 102*  BUN 23  --  29*  --   --   --   --   --   --  15 17  CREATININE 1.19  --  1.10  --   --   --   --   --   --  1.04 0.95  CALCIUM 8.1*  --   --   --   --   --   --   --   --  8.6* 8.1*  MG  --   --   --   --   --   --   --  1.9  --  1.9 1.8   < > = values in this interval not displayed.   GFR Estimated Creatinine Clearance: 98.4 mL/min (by C-G formula based on SCr of 0.95 mg/dL). Liver Function Tests: Recent Labs  Lab 03/05/20 0039  AST 46*  ALT 37  ALKPHOS 66  BILITOT 1.2  PROT 6.5  ALBUMIN 4.0   No results for input(s): LIPASE, AMYLASE in the last 168 hours. No results  for input(s): AMMONIA in the last 168 hours. Coagulation profile Recent Labs  Lab 03/05/20 0039  INR 1.4*    Cardiac Enzymes: No results for input(s): CKTOTAL, CKMB, CKMBINDEX, TROPONINI in the last 168 hours. BNP: Invalid input(s): POCBNP CBG: No results for input(s): GLUCAP in the last 168 hours. D-Dimer No results for input(s): DDIMER in the last 72 hours. Hgb A1c No results for input(s): HGBA1C in the last 72 hours. Lipid Profile No results for input(s): CHOL, HDL, LDLCALC, TRIG, CHOLHDL, LDLDIRECT in the last 72 hours. Thyroid function studies No results for input(s): TSH, T4TOTAL, T3FREE, THYROIDAB in the last 72 hours.  Invalid input(s): FREET3 Anemia work up No results for input(s): VITAMINB12, FOLATE, FERRITIN, TIBC, IRON, RETICCTPCT in the last 72 hours. Urinalysis    Component Value Date/Time   COLORURINE YELLOW 07/26/2019 1946   APPEARANCEUR CLEAR 07/26/2019 1946   LABSPEC 1.017 07/26/2019 1946   PHURINE 5.0 07/26/2019 1946   GLUCOSEU NEGATIVE 07/26/2019 1946   HGBUR SMALL (A) 07/26/2019 1946  HGBUR negative 05/14/2008 0943   BILIRUBINUR NEGATIVE 07/26/2019 1946   BILIRUBINUR Negative 09/25/2017 [place] 07/26/2019 1946   PROTEINUR NEGATIVE 07/26/2019 1946   UROBILINOGEN 0.2 09/25/2017 1443   UROBILINOGEN 0.2 05/14/2008 0943   NITRITE NEGATIVE 07/26/2019 1946   LEUKOCYTESUR MODERATE (A) 07/26/2019 1946    Microbiology Recent Results (from the past 240 hour(s))  SARS CORONAVIRUS 2 (TAT 6-24 HRS) Nasopharyngeal Nasopharyngeal Swab     Status: None   Collection Time: 03/02/20 10:55 AM   Specimen: Nasopharyngeal Swab  Result Value Ref Range Status   SARS Coronavirus 2 NEGATIVE NEGATIVE Final    Comment: (NOTE) SARS-CoV-2 target nucleic acids are NOT DETECTED.  The SARS-CoV-2 RNA is generally detectable in upper and lower respiratory specimens during the acute phase of infection. Negative results do not preclude SARS-CoV-2 infection,  do not rule out co-infections with other pathogens, and should not be used as the sole basis for treatment or other patient management decisions. Negative results must be combined with clinical observations, patient history, and epidemiological information. The expected result is Negative.  Fact Sheet for Patients: SugarRoll.be  Fact Sheet for Healthcare Providers: https://www.woods-mathews.com/  This test is not yet approved or cleared by the [place] FDA and  has been authorized for detection and/or diagnosis of SARS-CoV-2 by FDA under an Emergency Use Authorization (EUA). This EUA will remain  in effect (meaning this test can be used) for the duration of the COVID-19 declaration under Se ction 564(b)(1) of the Act, 21 U.S.C. section 360bbb-3(b)(1), unless the authorization is terminated or revoked sooner.  Performed at [facility], [place] [address] [place], [place] [address]   Resp Panel by RT-PCR (Flu A&B, Covid) Nasopharyngeal Swab     Status: None   Collection Time: 03/05/20  1:50 AM   Specimen: Nasopharyngeal Swab; Nasopharyngeal(NP) swabs in vial transport medium  Result Value Ref Range Status   SARS Coronavirus 2 by RT PCR NEGATIVE NEGATIVE Final    Comment: (NOTE) SARS-CoV-2 target nucleic acids are NOT DETECTED.  The SARS-CoV-2 RNA is generally detectable in upper respiratory specimens during the acute phase of infection. The lowest concentration of SARS-CoV-2 viral copies this assay can detect is 138 copies/mL. A negative result does not preclude SARS-Cov-2 infection and should not be used as the sole basis for treatment or other patient management decisions. A negative result may occur with  improper specimen collection/handling, submission of specimen other than nasopharyngeal swab, presence of viral mutation(s) within the areas targeted by this assay, and inadequate number of viral copies(<138 copies/mL). A  negative result must be combined with clinical observations, patient history, and epidemiological information. The expected result is Negative.  Fact Sheet for Patients:  EntrepreneurPulse.com.au  Fact Sheet for Healthcare Providers:  IncredibleEmployment.be  This test is no t yet approved or cleared by the [place] FDA and  has been authorized for detection and/or diagnosis of SARS-CoV-2 by FDA under an Emergency Use Authorization (EUA). This EUA will remain  in effect (meaning this test can be used) for the duration of the COVID-19 declaration under Section 564(b)(1) of the Act, 21 U.S.C.section 360bbb-3(b)(1), unless the authorization is terminated  or revoked sooner.       Influenza A by PCR NEGATIVE NEGATIVE Final   Influenza B by PCR NEGATIVE NEGATIVE Final    Comment: (NOTE) The Xpert Xpress SARS-CoV-2/FLU/RSV plus assay is intended as an aid in the diagnosis of influenza from Nasopharyngeal swab specimens and should not be used  as a sole basis for treatment. Nasal washings and aspirates are unacceptable for Xpert Xpress SARS-CoV-2/FLU/RSV testing.  Fact Sheet for Patients: EntrepreneurPulse.com.au  Fact Sheet for Healthcare Providers: IncredibleEmployment.be  This test is not yet approved or cleared by the [place] FDA and has been authorized for detection and/or diagnosis of SARS-CoV-2 by FDA under an Emergency Use Authorization (EUA). This EUA will remain in effect (meaning this test can be used) for the duration of the COVID-19 declaration under Section 564(b)(1) of the Act, 21 U.S.C. section 360bbb-3(b)(1), unless the authorization is terminated or revoked.  Performed at [facility], [place] [address] [place], [place] [address]   Resp panel by RT-PCR (RSV, Flu A&B, Covid) Nasopharyngeal Swab     Status: Abnormal   Collection Time: 03/05/20  1:50 AM   Specimen:  Nasopharyngeal Swab; Nasopharyngeal(NP) swabs in vial transport medium  Result Value Ref Range Status   SARS Coronavirus 2 by RT PCR NEGATIVE NEGATIVE Final   Influenza A by PCR NEGATIVE NEGATIVE Final   Influenza B by PCR NEGATIVE NEGATIVE Final   Resp Syncytial Virus by PCR POSITIVE (A) NEGATIVE Final    Comment: Performed at [facility], [place] [address] [place],  [address]    Inpatient Medications:   Scheduled Meds: . amiodarone  200 mg Oral BID  . apixaban  5 mg Oral BID  . digoxin  0.25 mg Intravenous Daily  . melatonin  6 mg Oral QHS  . metoprolol succinate  50 mg Oral Daily  . sodium chloride flush  3 mL Intravenous Q12H   PRN Meds: acetaminophen, albuterol, benzonatate, docusate sodium, LORazepam, morphine injection Continuous Infusions: . nitroGLYCERIN Stopped (03/05/20 1100)    Radiological Exams on Admission: CT ANGIO CHEST PE W OR WO CONTRAST  Result Date: 03/05/2020 CLINICAL DATA:  Shortness of breath.  Atrial fibrillation. EXAM: CT ANGIOGRAPHY CHEST WITH CONTRAST TECHNIQUE: Multidetector CT imaging of the chest was performed using the standard protocol during bolus administration of intravenous contrast. Multiplanar CT image reconstructions and MIPs were obtained to evaluate the vascular anatomy. CONTRAST:  37mL OMNIPAQUE IOHEXOL 350 MG/ML SOLN COMPARISON:  Chest radiograph March 05, 2020 FINDINGS: Cardiovascular: There is no demonstrable pulmonary embolus. The ascending thoracic aortic diameter measures 4.2 x 4 2 cm. No thoracic aortic dissection is evident. Note that the contrast bolus in the aorta is somewhat less than optimal for dissection assessment. The visualized great vessels appear normal. There is no appreciable pericardial effusion or pericardial thickening. There are scattered foci of coronary artery calcification. The main pulmonary outflow tract diameter is 3.6 cm, prominent. Heart mildly enlarged. Mediastinum/Nodes: Visualized thyroid appears  unremarkable. There are scattered subcentimeter mediastinal lymph nodes. No frank adenopathy evident by size criteria. No esophageal lesions are appreciable. Lungs/Pleura: Moderate free-flowing pleural effusions are noted bilaterally. There is multifocal airspace opacity throughout the lungs bilaterally. There is consolidation in both lower lung regions, likely combination of pneumonia and compressive atelectasis. Upper Abdomen: Visualized upper abdominal structures appear unremarkable. Musculoskeletal: There is degenerative change in the thoracic spine with areas of diffuse idiopathic skeletal hyperostosis. No blastic or lytic bone lesions are evident. No chest wall lesions. Review of the MIP images confirms the above findings. IMPRESSION: 1.  No demonstrable pulmonary embolus. 2. Ascending thoracic aortic prominence measuring 4.2 x 4.2 cm. Recommend annual imaging followup by CTA or MRA. This recommendation follows 2010 ACCF/AHA/AATS/ACR/ASA/SCA/SCAI/SIR/STS/SVM Guidelines for the Diagnosis and Management of Patients with Thoracic Aortic Disease. Circulation. 2010; 121: U981-X914. Aortic aneurysm NOS (  ICD10-I71.9) no dissection evident. Occasional foci of coronary artery calcification noted. Heart mildly enlarged. 3. Prominence of the main pulmonary outflow tract, a finding indicative of a degree of pulmonary arterial hypertension. 4. Sizable free-flowing pleural effusions bilaterally. Combination of pneumonia and compressive atelectasis in each lower lobe. Multiple foci of airspace opacity throughout the lungs elsewhere may represent additional foci of pneumonia. These areas have an appearance suggesting atypical organism pneumonia. Correlation with COVID-19 status advised given this appearance. Note that a degree of superimposed congestive heart failure cannot be excluded. 5.  No adenopathy by size criteria. 6.  Diffuse idiopathic skeletal hyperostosis in the thoracic spine. Electronically Signed   By: Lowella Grip III M.D.   On: 03/05/2020 14:13   DG CHEST PORT 1 VIEW  Result Date: 03/07/2020 CLINICAL DATA:  Shortness of breath, respiratory distress. Viral pneumonia. EXAM: PORTABLE CHEST 1 VIEW COMPARISON:  March 05, 2020. FINDINGS: The heart size and mediastinal contours are within normal limits. In comparison to March 05, 2020, improved diffuse interstitial and perihilar predominant opacities, suggestive of improved pulmonary edema. Persistent bibasilar opacities. No visible pleural effusions or pneumothorax on this limited AP portable radiograph. IMPRESSION: In comparison to March 05, 2020, improved diffuse interstitial and perihilar predominant opacities, suggestive of improved pulmonary edema. Persistent bibasilar opacities could represent atelectasis, aspiration, and/or pneumonia. Electronically Signed   By: Margaretha Sheffield MD   On: 03/07/2020 08:32    Assessment/Plan:  RSV, viral pneumonia - repeat CXR this am much improved; clinically he appears to have viral pna and on exam his coarse sounds are typical - he is clinically improved overall and should continue to do so - PCT baseline and repeat this am still negative and reassuring (do not suspect any superimposed bacterial component at this time); also COVID and Flu negative - has been weaned off O2 as well - remains afebrile and WBC normalized today - continue supportive care with albuterol and cough meds as needed - no indication for antibiotics or steroids  Acute pulmonary edema -Patient presented s/p ablation then DCCV x 2 for afib with acute and worsening SOB -This is likely multifactorial - due to afib with RVR, flash pulmonary edema in the setting of possible diastolic CHF, HTN crisis, and acute RSV infection -Cardiology is managing the pulmonary edema/HTN crisis/afib  PAF -Patient has refractory afib s/p ablation (11/16) and then DCCV x 2 (11/26) - rate now controlled   Thank you for this consultation.  Our  [facility] hospitalist team will follow the patient with you.   Time spent: Greater than 50% of the 35 minute visit was spent in counseling/coordination of care for the patient as laid out in the A&P.   Dwyane Dee M.D. [facility] 03/07/2020, 10:37 AM Pager: Secure chat

## 2020-03-07 NOTE — Hospital Course (Signed)
Michael Garner is an 64 y.o. male with h/o BPH, OSA, and atrial fibrillation s/p ablation and d/c on 11/26 who presented on 11/27 early AM with SOB concerning for acute flash pulmonary edema.  He reported that upon his return from from his ablation (11/16) and subsequent DCCV x 2 (11/26) he noted expiratory wheezing with mild SOB; he felt like there was a lot of mucous in his chest.   This progressed to the point of coming back to the ER. He was placed on BIPAP and given Versed with some improvement in his breathing.  He was given IV Lasix and was admitted by cardiology for flash pulmonary edema.  There was concern for HTN emergency with acute diastolic CHF.  He was started on NTG drip and given lasix.   He had reverted back to afib with RVR (150s). He also denied any known sick contacts prior to admission.   On workup he was found to be positive for RSV and medicine was asked to see patient in consult.

## 2020-03-07 NOTE — Discharge Summary (Addendum)
DISCHARGE SUMMARY    Patient ID: Michael Garner,  MRN: [id number], DOB/AGE: [birth year] 64 y.o.  Admit date: 03/05/2020 Discharge date:   Primary Care Physician: Laurey Morale, MD  Primary Cardiologist: Dr. Radford Pax Electrophysiologist: Der. Mishawn Hemann  Primary Discharge Diagnosis:  1. SOB 2. Flash pulmonary edema 3. Viral pneumonia     RSV +  Secondary Discharge Diagnosis:  1. Paroxysmal Afib     CHA2DS2Vasc is zero     S/p Afib ablation on 03/04/2020, on Eliquis, appropriately dosed 2. OSA     w/CPAP  Allergies  Allergen Reactions   Dextromethorphan Other (See Comments)    Per patient "feels like I'm having a heart attack."  Tolerates plain guaifenesin.   Penicillins     Not sure but immediate family members are allergic   Prednisone Palpitations    Patient "put me in afib"     Procedures This Admission:  none  Brief HPI: Michael Garner is a 64 y.o. male w/PMHx of paroxysmal Afib, OSA, BPH, developed acute SOB that was quickly progressive, EMS was called, evaluation noted pulmonary edema and pneumonia, he was admitted for management.[name]   Hospital Course:  The patient was admitted and initially required BIPAP support, NTG gtt for HTN and flash pulmonary edema,  he was + RSV, COVID and flu negative.  Diuresed with IV lasix, though his respiratory status improved, required ongoing oxygen demands.  CT chest negative for PE noting pulm edema and possible pneumonia as well.  IV diuretics continued.  TTE noted preserved LVEF   IM was consulted given RSV+, recommended continued supportive management.  His clinical course continued to improve, no steroids or antibiotics were recommended. He did have AFib w/RVR on hospital day #2 treated with amiodarone gtt that converted him to SR and he was transitioned to oral.  Hospital day #3, he was clinically improving, continued to respond well to diuretics,  encouraged OOB, not felt to have superimposed bacterial infection.  Was afebrile, WBC  normalized, PCT neg.  Respiratory status continued to improve markedly, with no SOB, minimal cough and no oxygen requirement. In d/w IM team, no isolation is needed, he should follow masking protocols as we are in the community and is OK for him to return to work from an RSV perspective.  He did go back into AFib w/RVR and IV dilt was initiated.  Today his HR 100-120's and asymptomatic with good BP.  The patient would like to go home, he was examined by Dr. Curt Bears and considered stable for discharge to home.  We discussed his work, he is on his feet moving the whole time, I suggested that he take it easy physically after several days in the hospital, resume work Monday after his AFib clinic visit  Continue Eliquis uninterrupted Increase his dilt to 360mg  daily Home with amio 400mg  BID and  Toprol 50QD Early follow up with AFib clinic oon Monday is in place  Home with Robitussin as well  Physical Exam: Vitals:   03/09/20 0007 03/09/20 0409 03/09/20 0728 03/09/20 0817  BP: 117/82 128/76 120/62   Pulse: (!) 108 (!) 109  (!) 125  Resp: 19 20 (!) 30 18  Temp: 98.5 F (36.9 C) 98.6 F (37 C) 97.7 F (36.5 C)   TempSrc:   Oral   SpO2: 95% 94%    Weight: 110.1 kg        GEN- The patient is well appearing, alert and oriented x 3 today.   HEENT: normocephalic,  atraumatic; sclera clear, conjunctiva pink; hearing intact; oropharynx clear; neck supple, no JVP Lungs- CTA b/l, normal work of breathing.  No wheezes, rales, rhonchi Heart- irreg-irreg, tachycardic, no murmurs, rubs or gallops, PMI not laterally displaced GI- soft, non-tender, non-distended Extremities- no clubbing, cyanosis, or edema MS- no significant deformity or atrophy Skin- warm and dry, no rash or lesion Psych- euthymic mood, full affect Neuro- no gross deficits   Labs:   Lab Results  Component Value Date   WBC 3.9 (L) 03/08/2020   HGB 14.7 03/08/2020   HCT 44.0 03/08/2020   MCV 90.7 03/08/2020   PLT 166  03/08/2020    Recent Labs  Lab 03/05/20 0039 03/05/20 0054 03/09/20 0018  NA 137   < > 133*  K 3.5   < > 3.5  CL 105   < > 98  CO2 19*   < > 21*  BUN 23   < > 18  CREATININE 1.19   < > 1.04  CALCIUM 8.1*   < > 9.2  PROT 6.5  --   --   BILITOT 1.2  --   --   ALKPHOS 66  --   --   ALT 37  --   --   AST 46*  --   --   GLUCOSE 164*   < > 102*   < > = values in this interval not displayed.    Discharge Medications:  Allergies as of 03/09/2020       Reactions   Dextromethorphan Other (See Comments)   Per patient "feels like I'm having a heart attack."  Tolerates plain guaifenesin.   Penicillins    Not sure but immediate family members are allergic   Prednisone Palpitations   Patient "put me in afib"        Medication List     TAKE these medications    amiodarone 400 MG tablet Commonly known as: PACERONE Take 1 tablet (400 mg total) by mouth 2 (two) times daily.   apixaban 5 MG Tabs tablet Commonly known as: Eliquis Take 1 tablet (5 mg total) by mouth 2 (two) times daily.   diltiazem 360 MG 24 hr capsule Commonly known as: CARDIZEM CD Take 1 capsule (360 mg total) by mouth daily. Start taking on: March 10, 2020 What changed:  medication strength how much to take   guaiFENesin 100 MG/5ML Soln Commonly known as: ROBITUSSIN Take 5 mLs (100 mg total) by mouth every 6 (six) hours.   ibuprofen 400 MG tablet Commonly known as: ADVIL Take 400 mg by mouth daily as needed for mild pain or moderate pain (Lower pain).   metoprolol succinate 50 MG 24 hr tablet Commonly known as: TOPROL-XL Take 1 tablet (50 mg total) by mouth daily. Take with or immediately following a meal. Start taking on: March 10, 2020   tadalafil 20 MG tablet Commonly known as: CIALIS Take 1 tablet (20 mg total) by mouth as needed for erectile dysfunction. What changed: when to take this        Disposition: Home Discharge Instructions     Diet - low sodium heart healthy   Complete  by: As directed    Increase activity slowly   Complete by: As directed        Follow-up Information     [facility].   Specialty: Cardiology Why: 03/14/2020 9:00AM with R. Marlene Lard, PA [employer] information: [address] [id number] [place] [phone]  Duration of Discharge Encounter: Greater than 30 minutes including physician time.  SignedTommye Standard, PA-C 03/09/2020 9:01 AM   I have seen and examined this patient with Tommye Standard.  Agree with above, note added to reflect my findings.  On exam, irregular, lungs clear.  Patient status post atrial fibrillation ablation.  He presented to the hospital on the same day of his ablation with flash pulmonary edema  CT scan also showed what appeared to be a pneumonia.  He is positive for RSV.  He was diuresed throughout his hospital stay.  Medicine was consulted to help with his pneumonia.  He did go back into atrial fibrillation and was started on amiodarone.  We Jazlyn Tippens have him follow-up in A. fib clinic.  Toria Monte M. Bernarda Erck MD 03/09/2020 11:22 AM

## 2020-03-07 NOTE — Progress Notes (Addendum)
Progress Note  Patient Name: Michael Garner Date of Encounter: 03/07/2020  [facility] Cardiologist: Mckinnon Glick  Subjective   C/o several interruptions last night and did not sleep well,  Otherwise is feeling better.  Had a coughing fit this am, no CP, much less SOB Off O2 this AM, just removing his CPAP   Inpatient Medications    Scheduled Meds: . amiodarone  200 mg Oral BID  . apixaban  5 mg Oral BID  . digoxin  0.25 mg Intravenous Daily  . melatonin  6 mg Oral QHS  . metoprolol succinate  50 mg Oral Daily  . sodium chloride flush  3 mL Intravenous Q12H   Continuous Infusions: . nitroGLYCERIN Stopped (03/05/20 1100)   PRN Meds: acetaminophen, albuterol, benzonatate, docusate sodium, LORazepam, morphine injection   Vital Signs    Vitals:   03/06/20 1604 03/06/20 1940 03/07/20 0343 03/07/20 0345  BP: 116/82 109/69 101/83   Pulse:  80 73   Resp: 20 20 19    Temp: 98.9 F (37.2 C) 99.2 F (37.3 C) 98 F (36.7 C)   TempSrc: Oral Oral Oral   SpO2: 96% 99% 97%   Weight:    111.9 kg    Intake/Output Summary (Last 24 hours) at 03/07/2020 0840 Last data filed at 03/07/2020 0347 Gross per 24 hour  Intake 1467.6 ml  Output 2725 ml  Net -1257.4 ml   Last 3 Weights 03/07/2020 03/06/2020 03/04/2020  Weight (lbs) 246 lb 11.1 oz 248 lb 0.3 oz 250 lb  Weight (kg) 111.9 kg 112.5 kg 113.399 kg      Telemetry    SR 70-80's, PACS, some block PACs- Personally Reviewed  ECG    None new- Personally Reviewed  Physical Exam   GEN: No acute distress.   Neck: No JVD Cardiac:  RRR, no murmurs, rubs, or gallops.  Respiratory: soft crackles LUL. GI: Soft, nontender, non-distended  MS: No edema; No deformity. Neuro:  Nonfocal  Psych: Normal affect   Labs    High Sensitivity Troponin:   Recent Labs  Lab 03/05/20 0050 03/05/20 0340  TROPONINIHS <2 1,803*      Chemistry Recent Labs  Lab 03/05/20 0039 03/05/20 0039 03/05/20 0054 03/05/20 0056 03/05/20 0115  03/05/20 0115 03/05/20 1003 03/06/20 0416 03/07/20 0240  NA 137   < > 139   < > 138  --   --  135 133*  K 3.5   < > 3.8   < > 3.3*   < > 4.0 3.7 3.2*  CL 105   < > 104  --   --   --   --  98 97*  CO2 19*  --   --   --   --   --   --  26 24  GLUCOSE 164*   < > 156*  --   --   --   --  108* 102*  BUN 23   < > 29*  --   --   --   --  15 17  CREATININE 1.19   < > 1.10  --   --   --   --  1.04 0.95  CALCIUM 8.1*  --   --   --   --   --   --  8.6* 8.1*  PROT 6.5  --   --   --   --   --   --   --   --   ALBUMIN 4.0  --   --   --   --   --   --   --   --  AST 46*  --   --   --   --   --   --   --   --   ALT 37  --   --   --   --   --   --   --   --   ALKPHOS 66  --   --   --   --   --   --   --   --   BILITOT 1.2  --   --   --   --   --   --   --   --   GFRNONAA >60  --   --   --   --   --   --  >60 >60  ANIONGAP 13  --   --   --   --   --   --  11 12   < > = values in this interval not displayed.     Hematology Recent Labs  Lab 03/05/20 0039 03/05/20 0054 03/05/20 0056 03/05/20 0115 03/06/20 0416  WBC 12.0*  --   --   --  9.1  RBC 5.18  --   --   --  4.60  HGB 15.8   < > 16.7 15.6 13.7  HCT 48.7   < > 49.0 46.0 41.8  MCV 94.0  --   --   --  90.9  MCH 30.5  --   --   --  29.8  MCHC 32.4  --   --   --  32.8  RDW 14.0  --   --   --  14.1  PLT 198  --   --   --  139*   < > = values in this interval not displayed.    BNP Recent Labs  Lab 03/05/20 0039  BNP 342.8*     DDimer No results for input(s): DDIMER in the last 168 hours.   Radiology    CT ANGIO CHEST PE W OR WO CONTRAST Result Date: 03/05/2020 CLINICAL DATA:  Shortness of breath.  Atrial fibrillation. EXAM: CT ANGIOGRAPHY CHEST WITH CONTRAST TECHNIQUE: Multidetector CT imaging of the chest was performed using the standard protocol during bolus administration of intravenous contrast. Multiplanar CT image reconstructions and MIPs were obtained to evaluate the vascular anatomy. CONTRAST:  63mL OMNIPAQUE IOHEXOL 350  MG/ML SOLN COMPARISON:  Chest radiograph March 05, 2020 FINDINGS: Cardiovascular: There is no demonstrable pulmonary embolus. The ascending thoracic aortic diameter measures 4.2 x 4 2 cm. No thoracic aortic dissection is evident. Note that the contrast bolus in the aorta is somewhat less than optimal for dissection assessment. The visualized great vessels appear normal. There is no appreciable pericardial effusion or pericardial thickening. There are scattered foci of coronary artery calcification. The main pulmonary outflow tract diameter is 3.6 cm, prominent. Heart mildly enlarged. Mediastinum/Nodes: Visualized thyroid appears unremarkable. There are scattered subcentimeter mediastinal lymph nodes. No frank adenopathy evident by size criteria. No esophageal lesions are appreciable. Lungs/Pleura: Moderate free-flowing pleural effusions are noted bilaterally. There is multifocal airspace opacity throughout the lungs bilaterally. There is consolidation in both lower lung regions, likely combination of pneumonia and compressive atelectasis. Upper Abdomen: Visualized upper abdominal structures appear unremarkable. Musculoskeletal: There is degenerative change in the thoracic spine with areas of diffuse idiopathic skeletal hyperostosis. No blastic or lytic bone lesions are evident. No chest wall lesions. Review of the MIP images confirms the above findings. IMPRESSION: 1.  No demonstrable pulmonary embolus. 2. Ascending  thoracic aortic prominence measuring 4.2 x 4.2 cm. Recommend annual imaging followup by CTA or MRA. This recommendation follows 2010 ACCF/AHA/AATS/ACR/ASA/SCA/SCAI/SIR/STS/SVM Guidelines for the Diagnosis and Management of Patients with Thoracic Aortic Disease. Circulation. 2010; 121: H371-I967. Aortic aneurysm NOS (ICD10-I71.9) no dissection evident. Occasional foci of coronary artery calcification noted. Heart mildly enlarged. 3. Prominence of the main pulmonary outflow tract, a finding indicative  of a degree of pulmonary arterial hypertension. 4. Sizable free-flowing pleural effusions bilaterally. Combination of pneumonia and compressive atelectasis in each lower lobe. Multiple foci of airspace opacity throughout the lungs elsewhere may represent additional foci of pneumonia. These areas have an appearance suggesting atypical organism pneumonia. Correlation with COVID-19 status advised given this appearance. Note that a degree of superimposed congestive heart failure cannot be excluded. 5.  No adenopathy by size criteria. 6.  Diffuse idiopathic skeletal hyperostosis in the thoracic spine. Electronically Signed   By: Lowella Grip III M.D.   On: 03/05/2020 14:13   DG CHEST PORT 1 VIEW Result Date: 03/07/2020 CLINICAL DATA:  Shortness of breath, respiratory distress. Viral pneumonia. EXAM: PORTABLE CHEST 1 VIEW COMPARISON:  March 05, 2020. FINDINGS: The heart size and mediastinal contours are within normal limits. In comparison to March 05, 2020, improved diffuse interstitial and perihilar predominant opacities, suggestive of improved pulmonary edema. Persistent bibasilar opacities. No visible pleural effusions or pneumothorax on this limited AP portable radiograph. IMPRESSION: In comparison to March 05, 2020, improved diffuse interstitial and perihilar predominant opacities, suggestive of improved pulmonary edema. Persistent bibasilar opacities could represent atelectasis, aspiration, and/or pneumonia. Electronically Signed   By: Margaretha Sheffield MD   On: 03/07/2020 08:32     Cardiac Studies   TTE 03/05/20 1. Left ventricular ejection fraction, by estimation, is 50 to 55%. The  left ventricle has low normal function. The left ventricle has no regional  wall motion abnormalities. The left ventricular internal cavity size was  mildly dilated. There is mild  concentric left ventricular hypertrophy. Left ventricular diastolic  function could not be evaluated. There is the  interventricular septum is  flattened in systole, consistent with right ventricular pressure overload.   2. Right ventricular systolic function is mildly reduced. The right  ventricular size is mildly enlarged. There is moderately elevated  pulmonary artery systolic pressure.   3. The mitral valve is normal in structure. Trivial mitral valve  regurgitation. No evidence of mitral stenosis.   4. The aortic valve is normal in structure. Aortic valve regurgitation is  not visualized. No aortic stenosis is present.   5. The inferior vena cava is dilated in size with <50% respiratory  variability, suggesting right atrial pressure of 15 mmHg.   Patient Profile     64 y.o. male with a history of atrial fibrillation status post ablation who he presented to the hospital with shortness of breath.  Assessment & Plan    1.  Persistent atrial fibrillation:       On amiodarone gtt >> SR      Transition to PO amiodarone  2.  Flash pulmonary edema with hypoxic respiratory failure:      On RA talking with [place] this AM 93-95%      CXR and exam are improved, one more dose of IV lasix today      Replace K+      Cumulatively negative -4025ml      Preserved LVEF      CT chest noted above, no noted PV stenosis mentioned  3.  Pneumonia: Found on  a CT scan.         He is RSV positive        appreciate IM help       No abx for now   4.  Chest pain:        not an ongoing complaint       Stop narcotics    Dr. Curt Bears has seen the patient this AM Hopefully ready for d/c in AM Encouraged OOB today, follow O2 needs   For questions or updates, please contact [place] Please consult [email or web address] for contact info under        Signed, Baldwin [place], PA-C  03/07/2020, 8:40 AM    I have seen and examined this patient with Tommye Standard.  Agree with above, note added to reflect my findings.  On exam, RRR, no murmurs, faint crackles.  Currently feeling better this morning.  Did have a coughing  fit this morning, though with less shortness of breath.  He is converted to sinus rhythm on IV amiodarone.  We Tashawn Greff switch his amiodarone to p.o. and have him ambulate today.  If he does well with ambulation, likely Nyquan Selbe discharge home.  Appreciate medicine recommendations for RSV.  Jakson Delpilar M. Norvella Loscalzo MD 03/07/2020 8:51 AM

## 2020-03-07 NOTE — Progress Notes (Signed)
Patient has home CPAP unit at bedside. No assistance needed from RT at this time. Patient aware to call if needed.

## 2020-03-08 ENCOUNTER — Encounter (HOSPITAL_COMMUNITY): Payer: Self-pay | Admitting: Cardiology

## 2020-03-08 DIAGNOSIS — J810 Acute pulmonary edema: Secondary | ICD-10-CM | POA: Diagnosis not present

## 2020-03-08 LAB — CBC WITH DIFFERENTIAL/PLATELET
Abs Immature Granulocytes: 0.04 10*3/uL (ref 0.00–0.07)
Basophils Absolute: 0.1 10*3/uL (ref 0.0–0.1)
Basophils Relative: 2 %
Eosinophils Absolute: 0.3 10*3/uL (ref 0.0–0.5)
Eosinophils Relative: 7 %
HCT: 44 % (ref 39.0–52.0)
Hemoglobin: 14.7 g/dL (ref 13.0–17.0)
Immature Granulocytes: 1 %
Lymphocytes Relative: 19 %
Lymphs Abs: 0.7 10*3/uL (ref 0.7–4.0)
MCH: 30.3 pg (ref 26.0–34.0)
MCHC: 33.4 g/dL (ref 30.0–36.0)
MCV: 90.7 fL (ref 80.0–100.0)
Monocytes Absolute: 0.5 10*3/uL (ref 0.1–1.0)
Monocytes Relative: 12 %
Neutro Abs: 2.3 10*3/uL (ref 1.7–7.7)
Neutrophils Relative %: 59 %
Platelets: 166 10*3/uL (ref 150–400)
RBC: 4.85 MIL/uL (ref 4.22–5.81)
RDW: 13.4 % (ref 11.5–15.5)
WBC: 3.9 10*3/uL — ABNORMAL LOW (ref 4.0–10.5)
nRBC: 0 % (ref 0.0–0.2)

## 2020-03-08 LAB — MAGNESIUM: Magnesium: 2 mg/dL (ref 1.7–2.4)

## 2020-03-08 LAB — BASIC METABOLIC PANEL
Anion gap: 8 (ref 5–15)
BUN: 19 mg/dL (ref 8–23)
CO2: 27 mmol/L (ref 22–32)
Calcium: 8.8 mg/dL — ABNORMAL LOW (ref 8.9–10.3)
Chloride: 97 mmol/L — ABNORMAL LOW (ref 98–111)
Creatinine, Ser: 1.11 mg/dL (ref 0.61–1.24)
GFR, Estimated: >60 mL/min (ref >60)
Glucose, Bld: 87 mg/dL (ref 70–99)
Potassium: 3.9 mmol/L (ref 3.5–5.1)
Sodium: 132 mmol/L — ABNORMAL LOW (ref 135–145)

## 2020-03-08 MED ORDER — DILTIAZEM HCL ER COATED BEADS 240 MG PO CP24
240.0000 mg | ORAL_CAPSULE | Freq: Every day | ORAL | Status: DC
  Administered 2020-03-08: 240 mg via ORAL
  Filled 2020-03-08: qty 1

## 2020-03-08 MED ORDER — DILTIAZEM HCL-DEXTROSE 125-5 MG/125ML-% IV SOLN (PREMIX)
5.0000 mg/h | INTRAVENOUS | Status: DC
  Administered 2020-03-08: 5 mg/h via INTRAVENOUS
  Administered 2020-03-08 – 2020-03-09 (×2): 15 mg/h via INTRAVENOUS
  Filled 2020-03-08 (×3): qty 125

## 2020-03-08 MED ORDER — FUROSEMIDE 10 MG/ML IJ SOLN
40.0000 mg | Freq: Once | INTRAMUSCULAR | Status: AC
  Administered 2020-03-08: 40 mg via INTRAVENOUS
  Filled 2020-03-08: qty 4

## 2020-03-08 MED ORDER — AMIODARONE HCL 200 MG PO TABS
400.0000 mg | ORAL_TABLET | Freq: Two times a day (BID) | ORAL | Status: DC
  Administered 2020-03-08 – 2020-03-09 (×3): 400 mg via ORAL
  Filled 2020-03-08 (×3): qty 2

## 2020-03-08 MED ORDER — AMIODARONE IV BOLUS ONLY 150 MG/100ML
150.0000 mg | Freq: Once | INTRAVENOUS | Status: AC
  Administered 2020-03-08: 150 mg via INTRAVENOUS
  Filled 2020-03-08: qty 100

## 2020-03-08 MED ORDER — GUAIFENESIN 100 MG/5ML PO SOLN
5.0000 mL | Freq: Four times a day (QID) | ORAL | Status: DC
  Administered 2020-03-08 – 2020-03-09 (×4): 100 mg via ORAL
  Filled 2020-03-08 (×3): qty 5

## 2020-03-08 MED ORDER — GUAIFENESIN 100 MG/5ML PO SOLN: 5.0000 mL | Freq: Four times a day (QID) | ORAL | Status: DC | PRN

## 2020-03-08 MED ORDER — DILTIAZEM LOAD VIA INFUSION
10.0000 mg | Freq: Once | INTRAVENOUS | Status: AC
  Administered 2020-03-08: 10 mg via INTRAVENOUS
  Filled 2020-03-08: qty 10

## 2020-03-08 MED ORDER — AMIODARONE HCL IN DEXTROSE 360-4.14 MG/200ML-% IV SOLN
INTRAVENOUS | Status: AC
  Filled 2020-03-08: qty 200

## 2020-03-08 NOTE — Progress Notes (Signed)
Orders received from Tyller Bowlby, [place].

## 2020-03-08 NOTE — Progress Notes (Addendum)
Pt. Upset with process getting impatient with treatment. Encouraged pt to hang in there with the plan of care.

## 2020-03-08 NOTE — Progress Notes (Signed)
Pt has CPAP at bedside. He is aware to call if he needs assistance.

## 2020-03-08 NOTE — Progress Notes (Signed)
Pt anxious walking in room explained to pt the importance of let his HR settle and get under control, IV Aitvan given. HR 150-170s. Awaiting a call back from EP Cards.

## 2020-03-08 NOTE — Progress Notes (Addendum)
Pt converted to A fib with RVR in the 160s. When this RN checked on the pt, the pt stated that he had a sharp pain in his left abdomen that had resolved quickly (before this RN walked into his room). Pt denied any recent coughing spells as pt has CPAP on. EKG being performed by NT. HR currently 130s-150s. Will page MD.

## 2020-03-08 NOTE — Progress Notes (Addendum)
Progress Note  Patient Name: Michael Garner Date of Encounter: 03/08/2020  [facility] Cardiologist: [place], unaware of his Afib, no CP, SOB, coughing is improved as well.  "I feel like I have a cold"  Inpatient Medications    Scheduled Meds:  amiodarone  400 mg Oral BID   apixaban  5 mg Oral BID   diltiazem  240 mg Oral Daily   melatonin  6 mg Oral QHS   metoprolol succinate  50 mg Oral Daily   sodium chloride flush  3 mL Intravenous Q12H   Continuous Infusions:  amiodarone     nitroGLYCERIN Stopped (03/05/20 1100)   PRN Meds: acetaminophen, albuterol, benzonatate, docusate sodium, LORazepam, morphine injection   Vital Signs    Vitals:   03/07/20 1928 03/08/20 0504 03/08/20 0600 03/08/20 0800  BP: 133/70 140/85 (!) 141/83 (!) 145/77  Pulse: 79 78 (!) 156   Resp: 20 17 16    Temp: 98.4 F (36.9 C) 98.9 F (37.2 C) 99.5 F (37.5 C)   TempSrc: Oral Oral Oral   SpO2: 96% 97% 97% 99%  Weight:  111.9 kg      Intake/Output Summary (Last 24 hours) at 03/08/2020 0832 Last data filed at 03/08/2020 0523 Gross per 24 hour  Intake 920 ml  Output 1650 ml  Net -730 ml   Last 3 Weights 03/08/2020 03/07/2020 03/06/2020  Weight (lbs) 246 lb 11.1 oz 246 lb 11.1 oz 248 lb 0.3 oz  Weight (kg) 111.9 kg 111.9 kg 112.5 kg      Telemetry    This AM > AFib w/RVR 150's-160's- Personally Reviewed  ECG    None new- Personally Reviewed  Physical Exam   GEN: No acute distress.   Neck: No JVD Cardiac:  irreg-irreg, tachycardic, no murmurs, rubs, or gallops.  Respiratory: soft crackles LUL. GI: Soft, nontender, non-distended  MS: No edema; No deformity. Neuro:  Nonfocal  Psych: Normal affect   Labs    High Sensitivity Troponin:   Recent Labs  Lab 03/05/20 0050 03/05/20 0340  TROPONINIHS <2 1,803*      Chemistry Recent Labs  Lab 03/05/20 0039 03/05/20 0054 03/06/20 0416 03/07/20 0240 03/08/20 0347  NA 137   < > 135 133* 132*  K  3.5   < > 3.7 3.2* 3.9  CL 105   < > 98 97* 97*  CO2 19*   < > 26 24 27   GLUCOSE 164*   < > 108* 102* 87  BUN 23   < > 15 17 19   CREATININE 1.19   < > 1.04 0.95 1.11  CALCIUM 8.1*   < > 8.6* 8.1* 8.8*  PROT 6.5  --   --   --   --   ALBUMIN 4.0  --   --   --   --   AST 46*  --   --   --   --   ALT 37  --   --   --   --   ALKPHOS 66  --   --   --   --   BILITOT 1.2  --   --   --   --   GFRNONAA >60   < > >60 >60 >60  ANIONGAP 13   < > 11 12 8    < > = values in this interval not displayed.     Hematology Recent Labs  Lab 03/05/20 0017 03/05/20 0054 03/05/20 0115 03/06/20 0416 03/08/20 4944  WBC 12.0*  --   --  9.1 3.9*  RBC 5.18  --   --  4.60 4.85  HGB 15.8   < > 15.6 13.7 14.7  HCT 48.7   < > 46.0 41.8 44.0  MCV 94.0  --   --  90.9 90.7  MCH 30.5  --   --  29.8 30.3  MCHC 32.4  --   --  32.8 33.4  RDW 14.0  --   --  14.1 13.4  PLT 198  --   --  139* 166   < > = values in this interval not displayed.    BNP Recent Labs  Lab 03/05/20 0039  BNP 342.8*     DDimer No results for input(s): DDIMER in the last 168 hours.   Radiology    CT ANGIO CHEST PE W OR WO CONTRAST Result Date: 03/05/2020 CLINICAL DATA:  Shortness of breath.  Atrial fibrillation. EXAM: CT ANGIOGRAPHY CHEST WITH CONTRAST TECHNIQUE: Multidetector CT imaging of the chest was performed using the standard protocol during bolus administration of intravenous contrast. Multiplanar CT image reconstructions and MIPs were obtained to evaluate the vascular anatomy. CONTRAST:  78mL OMNIPAQUE IOHEXOL 350 MG/ML SOLN COMPARISON:  Chest radiograph March 05, 2020 FINDINGS: Cardiovascular: There is no demonstrable pulmonary embolus. The ascending thoracic aortic diameter measures 4.2 x 4 2 cm. No thoracic aortic dissection is evident. Note that the contrast bolus in the aorta is somewhat less than optimal for dissection assessment. The visualized great vessels appear normal. There is no appreciable pericardial effusion  or pericardial thickening. There are scattered foci of coronary artery calcification. The main pulmonary outflow tract diameter is 3.6 cm, prominent. Heart mildly enlarged. Mediastinum/Nodes: Visualized thyroid appears unremarkable. There are scattered subcentimeter mediastinal lymph nodes. No frank adenopathy evident by size criteria. No esophageal lesions are appreciable. Lungs/Pleura: Moderate free-flowing pleural effusions are noted bilaterally. There is multifocal airspace opacity throughout the lungs bilaterally. There is consolidation in both lower lung regions, likely combination of pneumonia and compressive atelectasis. Upper Abdomen: Visualized upper abdominal structures appear unremarkable. Musculoskeletal: There is degenerative change in the thoracic spine with areas of diffuse idiopathic skeletal hyperostosis. No blastic or lytic bone lesions are evident. No chest wall lesions. Review of the MIP images confirms the above findings. IMPRESSION: 1.  No demonstrable pulmonary embolus. 2. Ascending thoracic aortic prominence measuring 4.2 x 4.2 cm. Recommend annual imaging followup by CTA or MRA. This recommendation follows 2010 ACCF/AHA/AATS/ACR/ASA/SCA/SCAI/SIR/STS/SVM Guidelines for the Diagnosis and Management of Patients with Thoracic Aortic Disease. Circulation. 2010; 121: G921-J941. Aortic aneurysm NOS (ICD10-I71.9) no dissection evident. Occasional foci of coronary artery calcification noted. Heart mildly enlarged. 3. Prominence of the main pulmonary outflow tract, a finding indicative of a degree of pulmonary arterial hypertension. 4. Sizable free-flowing pleural effusions bilaterally. Combination of pneumonia and compressive atelectasis in each lower lobe. Multiple foci of airspace opacity throughout the lungs elsewhere may represent additional foci of pneumonia. These areas have an appearance suggesting atypical organism pneumonia. Correlation with COVID-19 status advised given this appearance.  Note that a degree of superimposed congestive heart failure cannot be excluded. 5.  No adenopathy by size criteria. 6.  Diffuse idiopathic skeletal hyperostosis in the thoracic spine. Electronically Signed   By: Lowella Grip III M.D.   On: 03/05/2020 14:13   DG CHEST PORT 1 VIEW Result Date: 03/07/2020 CLINICAL DATA:  Shortness of breath, respiratory distress. Viral pneumonia. EXAM: PORTABLE CHEST 1 VIEW COMPARISON:  March 05, 2020. FINDINGS: The heart  size and mediastinal contours are within normal limits. In comparison to March 05, 2020, improved diffuse interstitial and perihilar predominant opacities, suggestive of improved pulmonary edema. Persistent bibasilar opacities. No visible pleural effusions or pneumothorax on this limited AP portable radiograph. IMPRESSION: In comparison to March 05, 2020, improved diffuse interstitial and perihilar predominant opacities, suggestive of improved pulmonary edema. Persistent bibasilar opacities could represent atelectasis, aspiration, and/or pneumonia. Electronically Signed   By: Margaretha Sheffield MD   On: 03/07/2020 08:32     Cardiac Studies   TTE 03/05/20 1. Left ventricular ejection fraction, by estimation, is 50 to 55%. The  left ventricle has low normal function. The left ventricle has no regional  wall motion abnormalities. The left ventricular internal cavity size was  mildly dilated. There is mild  concentric left ventricular hypertrophy. Left ventricular diastolic  function could not be evaluated. There is the interventricular septum is  flattened in systole, consistent with right ventricular pressure overload.   2. Right ventricular systolic function is mildly reduced. The right  ventricular size is mildly enlarged. There is moderately elevated  pulmonary artery systolic pressure.   3. The mitral valve is normal in structure. Trivial mitral valve  regurgitation. No evidence of mitral stenosis.   4. The aortic valve is normal  in structure. Aortic valve regurgitation is  not visualized. No aortic stenosis is present.   5. The inferior vena cava is dilated in size with <50% respiratory  variability, suggesting right atrial pressure of 15 mmHg.   Patient Profile     64 y.o. male with a history of atrial fibrillation status post ablation who he presented to the hospital with shortness of breath.  Assessment & Plan    1.  Persistent atrial fibrillation:       On amiodarone gtt >> SR     amio bolus is finishing now     Increase PO amioda to 400 BID     stop dig     Start dilt 240g daily  2.  Flash pulmonary edema:      On RA talking with [place] this AM 93-95%      CXR and exam are improved, one more dose of IV lasix today      Cumulatively negative -4717ml, fairly euvolemic yesterday      Lasix IV again today      Preserved LVEF      CT chest noted above, no noted PV stenosis mentioned  3.  Pneumonia: Found on a CT scan.         He is RSV positive        appreciate IM help       No abx for now       RA O2 sats look good       Feels a little achy, "like I have a cold"       Afebrile       WBC ok       PCT neg  4.  Chest pain:        not an ongoing complaint       Remains resolved    Dr. Curt Bears has seen the patient this AM    For questions or updates, please contact [place] Please consult [email or web address] for contact info under        Signed, Baldwin [place], PA-C  03/08/2020, 8:32 AM    I have seen and examined this patient with Tommye Standard.  Agree with above, note added to  reflect my findings.  On exam, tachycardic, irregular, no murmurs.  Patient unfortunately went back into atrial fibrillation this morning.  He feels like he has a cold today.  We Abdulrahman Bracey increase his metoprolol and start him on p.o. diltiazem.  We Tyleigh Mahn also increase his amiodarone to 400 mg twice daily.  If he does not convert, he may require cardioversion.  Latrelle Fuston M. Mabel Unrein MD 03/08/2020 8:51 AM

## 2020-03-08 NOTE — Progress Notes (Signed)
PROGRESS NOTE  Michael Garner  DOB: 12/14/1955  PCP: Laurey Morale, MD ACZ:[id number]  DOA: 03/05/2020  LOS: 2 days   Chief Complaint  Patient presents with  . Respiratory Distress   Brief narrative: Drayce Tawil an 64 y.o.malewith h/o BPH, OSA,and atrial fibrillation s/p ablation and d/c on 11/26 who presented on 11/27 early AM with SOB concerning for acute flash pulmonary edema.  He reported that upon his return from from his ablation (11/16) and subsequent DCCV x 2 (11/26) he noted expiratory wheezing with mild SOB; he felt like there was a lot of mucous in his chest.  This progressed to the point of coming back to the ER. He was placed on BIPAP and given Versed with some improvement in his breathing. He was given IV Lasix and was admitted by cardiology for flash pulmonary edema. There was concern for HTN emergency with acute diastolic CHF. He was started on NTG drip and given lasix.  He reverted back to afib with RVR (150s). He also denied any known sick contacts prior to admission.   On workup he was found to be positive for RSV and medicine was asked to see patient in consult.   Subjective: Patient was seen and examined this morning.  Middle-aged Caucasian male.  Not in distress.  Feels better but he still has cough and intermittent wheezing.  Assessment/Plan: RSV, viral pneumonia -repeat CXR on 11/13 showed improvement. -Patient reports intermittent cough, wheezing.  -He is currently on as needed Tessalon Perles.  I would add scheduled Robitussin.  -Currently off oxygen.  -WBC normalized.  Procalcitonin less than 0.1. -No indication for steroids at this time. Recent Labs  Lab 03/05/20 0039 03/06/20 0416 03/06/20 0449 03/07/20 0240 03/08/20 0347  WBC 12.0* 9.1  --   --  3.9*  PROCALCITON  --   --  <0.10 <0.10  --    Acute pulmonary edema -Patient presented s/p ablation then DCCV x 2 for afib with acute and worsening SOB -This is likely multifactorial - due  to afib with RVR, flash pulmonary edema in the setting of possible diastolic CHF, HTN crisis, and acute RSV infection -Cardiology is managing the pulmonary edema/HTN crisis/afib  PAF -Patient has refractory afib s/p ablation (11/16) and then DCCV x 2 (11/26) - rate now controlled  Mobility: Encourage ambulation Code Status:   Code Status: Full Code  Nutritional status: Body mass index is 35.4 kg/m.     Diet Order            Diet Heart Room service appropriate? Yes; Fluid consistency: Thin  Diet effective now                 DVT prophylaxis: apixaban (ELIQUIS) tablet 5 mg   Antimicrobials:  None Fluid: None Family Communication:  None at bedside  Status is: Inpatient  Infusions:  . amiodarone    . diltiazem (CARDIZEM) infusion 10 mg/hr (03/08/20 1320)  . nitroGLYCERIN Stopped (03/05/20 1100)    Scheduled Meds: . amiodarone  400 mg Oral BID  . apixaban  5 mg Oral BID  . guaiFENesin  5 mL Oral Q6H  . melatonin  6 mg Oral QHS  . metoprolol succinate  50 mg Oral Daily  . sodium chloride flush  3 mL Intravenous Q12H    Antimicrobials: Anti-infectives (From admission, onward)   None      PRN meds: acetaminophen, albuterol, benzonatate, docusate sodium, LORazepam, morphine injection   Objective: Vitals:   03/08/20 0800 03/08/20 1025  BP: [name])  145/77 103/82  Pulse:  84  Resp:  18  Temp:  98.6 F (37 C)  SpO2: 99% 93%    Intake/Output Summary (Last 24 hours) at 03/08/2020 1516 Last data filed at 03/08/2020 0900 Gross per 24 hour  Intake 700 ml  Output 1150 ml  Net -450 ml   Filed Weights   03/06/20 0051 03/07/20 0345 03/08/20 0504  Weight: 112.5 kg 111.9 kg 111.9 kg   Weight change: 0 kg Body mass index is 35.4 kg/m.   Physical Exam: General exam: Pleasant, not in distress Skin: No rashes, lesions or ulcers. HEENT: Atraumatic, normocephalic, no obvious bleeding Lungs: Mild scattered wheezing bilaterally CVS: Rate controlled A. fib GI/Abd  soft, nontender, nondistended, bowel sound present CNS: Alert, awake, oriented x3 Psychiatry: Mood appropriate Extremities: No pedal edema, no calf tenderness  Data Review: I have personally reviewed the laboratory data and studies available.  Recent Labs  Lab 03/05/20 0039 03/05/20 0039 03/05/20 0054 03/05/20 0056 03/05/20 0115 03/06/20 0416 03/08/20 0347  WBC 12.0*  --   --   --   --  9.1 3.9*  NEUTROABS 9.1*  --   --   --   --   --  2.3  HGB 15.8   < > 16.7 16.7 15.6 13.7 14.7  HCT 48.7   < > 49.0 49.0 46.0 41.8 44.0  MCV 94.0  --   --   --   --  90.9 90.7  PLT 198  --   --   --   --  139* 166   < > = values in this interval not displayed.   Recent Labs  Lab 03/05/20 0039 03/05/20 0039 03/05/20 0054 03/05/20 0054 03/05/20 0056 03/05/20 0056 03/05/20 0115 03/05/20 0340 03/05/20 1003 03/06/20 0416 03/07/20 0240 03/08/20 0347  NA 137   < > 139   < > 139  --  138  --   --  135 133* 132*  K 3.5   < > 3.8   < > 3.8   < > 3.3*  --  4.0 3.7 3.2* 3.9  CL 105  --  104  --   --   --   --   --   --  98 97* 97*  CO2 19*  --   --   --   --   --   --   --   --  26 24 27   GLUCOSE 164*  --  156*  --   --   --   --   --   --  108* 102* 87  BUN 23  --  29*  --   --   --   --   --   --  15 17 19   CREATININE 1.19  --  1.10  --   --   --   --   --   --  1.04 0.95 1.11  CALCIUM 8.1*  --   --   --   --   --   --   --   --  8.6* 8.1* 8.8*  MG  --   --   --   --   --   --   --  1.9  --  1.9 1.8 2.0   < > = values in this interval not displayed.    F/u labs ordered.  Signed, Terrilee Croak, MD [facility] 03/08/2020

## 2020-03-08 NOTE — Progress Notes (Signed)
Dr. Margaretann Loveless paged.

## 2020-03-09 ENCOUNTER — Telehealth: Payer: Self-pay | Admitting: Family Medicine

## 2020-03-09 LAB — BASIC METABOLIC PANEL
Anion gap: 14 (ref 5–15)
BUN: 18 mg/dL (ref 8–23)
CO2: 21 mmol/L — ABNORMAL LOW (ref 22–32)
Calcium: 9.2 mg/dL (ref 8.9–10.3)
Chloride: 98 mmol/L (ref 98–111)
Creatinine, Ser: 1.04 mg/dL (ref 0.61–1.24)
GFR, Estimated: >60 mL/min (ref >60)
Glucose, Bld: 102 mg/dL — ABNORMAL HIGH (ref 70–99)
Potassium: 3.5 mmol/L (ref 3.5–5.1)
Sodium: 133 mmol/L — ABNORMAL LOW (ref 135–145)

## 2020-03-09 LAB — MAGNESIUM: Magnesium: 1.9 mg/dL (ref 1.7–2.4)

## 2020-03-09 MED ORDER — AMIODARONE HCL 400 MG PO TABS: 400.0000 mg | ORAL_TABLET | Freq: Two times a day (BID) | ORAL | 0 refills | Status: DC

## 2020-03-09 MED ORDER — DILTIAZEM HCL ER COATED BEADS 360 MG PO CP24: 360.0000 mg | ORAL_CAPSULE | Freq: Every day | ORAL | 3 refills | Status: DC

## 2020-03-09 MED ORDER — METOPROLOL SUCCINATE ER 50 MG PO TB24: 50.0000 mg | ORAL_TABLET | Freq: Every day | ORAL | 1 refills | Status: DC

## 2020-03-09 MED ORDER — DILTIAZEM HCL ER COATED BEADS 180 MG PO CP24
360.0000 mg | ORAL_CAPSULE | Freq: Every day | ORAL | Status: DC
  Administered 2020-03-09: 360 mg via ORAL
  Filled 2020-03-09: qty 2

## 2020-03-09 MED ORDER — GUAIFENESIN 100 MG/5ML PO SOLN: 5.0000 mL | Freq: Four times a day (QID) | ORAL | 0 refills | Status: DC

## 2020-03-09 NOTE — Plan of Care (Signed)
  Problem: Education: Goal: Knowledge of General Education information will improve Description: Including pain rating scale, medication(s)/side effects and non-pharmacologic comfort measures Outcome: Adequate for Discharge   Problem: Health Behavior/Discharge Planning: Goal: Ability to manage health-related needs will improve Outcome: Adequate for Discharge   Problem: Clinical Measurements: Goal: Ability to maintain clinical measurements within normal limits will improve Outcome: Adequate for Discharge Goal: Will remain free from infection Outcome: Adequate for Discharge Goal: Diagnostic test results will improve Outcome: Adequate for Discharge Goal: Respiratory complications will improve Outcome: Adequate for Discharge Goal: Cardiovascular complication will be avoided Outcome: Adequate for Discharge   Problem: Activity: Goal: Risk for activity intolerance will decrease Outcome: Adequate for Discharge   Problem: Nutrition: Goal: Adequate nutrition will be maintained Outcome: Adequate for Discharge   Problem: Coping: Goal: Level of anxiety will decrease Outcome: Adequate for Discharge   Problem: Elimination: Goal: Will not experience complications related to bowel motility Outcome: Adequate for Discharge Goal: Will not experience complications related to urinary retention Outcome: Adequate for Discharge   Problem: Pain Managment: Goal: General experience of comfort will improve Outcome: Adequate for Discharge   Problem: Safety: Goal: Ability to remain free from injury will improve Outcome: Adequate for Discharge   Problem: Skin Integrity: Goal: Risk for impaired skin integrity will decrease Outcome: Adequate for Discharge   Problem: Education: Goal: Ability to demonstrate management of disease process will improve Outcome: Adequate for Discharge Goal: Ability to verbalize understanding of medication therapies will improve Outcome: Adequate for Discharge Goal:  Individualized Educational Video(s) Outcome: Adequate for Discharge   Problem: Activity: Goal: Capacity to carry out activities will improve Outcome: Adequate for Discharge   Problem: Cardiac: Goal: Ability to achieve and maintain adequate cardiopulmonary perfusion will improve Outcome: Adequate for Discharge   Problem: Education: Goal: Knowledge of disease or condition will improve Outcome: Adequate for Discharge Goal: Understanding of medication regimen will improve Outcome: Adequate for Discharge Goal: Individualized Educational Video(s) Outcome: Adequate for Discharge   Problem: Activity: Goal: Ability to tolerate increased activity will improve Outcome: Adequate for Discharge   Problem: Cardiac: Goal: Ability to achieve and maintain adequate cardiopulmonary perfusion will improve Outcome: Adequate for Discharge   Problem: Health Behavior/Discharge Planning: Goal: Ability to safely manage health-related needs after discharge will improve Outcome: Adequate for Discharge   

## 2020-03-09 NOTE — Progress Notes (Signed)
D/C instructions given and reviewed. No questions asked but encouraged to call with any concerns. Tele and IV's removed, tolerated well. 

## 2020-03-09 NOTE — Telephone Encounter (Signed)
Transition Care Management Unsuccessful Follow-up Telephone Call  Date of discharge and from where:  03/09/2020 from [facility]  Attempts:  1st Attempt  Reason for unsuccessful TCM follow-up call:  Left voice message

## 2020-03-09 NOTE — Discharge Instructions (Signed)
Continue post procedure activity restrictions/instructions as previously instructed after your ablation procedure.

## 2020-03-10 ENCOUNTER — Encounter: Payer: Self-pay | Admitting: Family Medicine

## 2020-03-10 ENCOUNTER — Telehealth: Payer: Self-pay | Admitting: Family Medicine

## 2020-03-10 NOTE — Telephone Encounter (Signed)
Transition Care Management Unsuccessful Follow-up Telephone Call  Date of discharge and from where:  03/09/2020 from [facility]   Attempts:  2nd Attempt  Reason for unsuccessful TCM follow-up call:  Left voice message

## 2020-03-10 NOTE — Telephone Encounter (Signed)
Patient recently discharged from the Hospital on yesterday 03/09/2020 with RSV and A Fib. Patient is still experiencing some lethargy however he is scheduled to return to work tomorrow. He would like to know if he could have a work excuse that keeps him out until the Monday 12/06. He has hospital follow up scheduled with you on 03/16/20 @ 1:00 PM

## 2020-03-10 NOTE — Telephone Encounter (Signed)
Transition Care Management Follow-up Telephone Call  Date of discharge and from where: 03/09/2020 from [facility]   How have you been since you were released from the hospital? Patient states he has been very lethargic since being discharged.  Any questions or concerns? Yes   Items Reviewed:  Did the pt receive and understand the discharge instructions provided? Yes   Medications obtained and verified? Yes   Other? No   Any new allergies since your discharge? Yes   Dietary orders reviewed? Yes  Do you have support at home? Yes   Home Care and Equipment/Supplies: Were home health services ordered? not applicable If so, what is the name of the agency? N/A   Has the agency set up a time to come to the patient's home? not applicable Were any new equipment or medical supplies ordered?  No What is the name of the medical supply agency? N/A  Were you able to get the supplies/equipment? no Do you have any questions related to the use of the equipment or supplies? No  Functional Questionnaire: (I = Independent and D = Dependent) ADLs: I  Bathing/Dressing- I  Meal Prep- I  Eating- I  Maintaining continence- I  Transferring/Ambulation- I  Managing Meds- I  Follow up appointments reviewed:   PCP Hospital f/u appt confirmed? Yes  Scheduled to see Dr. Sarajane Jews  on 03/16/2020 @ 1:00 PM.  [facility] f/u appt confirmed? Yes  Scheduled to see Shelah Lewandowsky  on 03/14/2020 @ 9:00 am.  Are transportation arrangements needed? No   If their condition worsens, is the pt aware to call PCP or go to the Emergency Dept.? Yes  Was the patient provided with contact information for the PCP's office or ED? Yes  Was to pt encouraged to call back with questions or concerns? Yes

## 2020-03-11 NOTE — Progress Notes (Signed)
Primary Care Physician: Laurey Morale, MD Referring Physician: [facility] Primary EP: Dr Priscella Mann Michael Garner is a 64 y.o. male with a history of paroxysmal atrial fibrillation and OSA who presents for follow up in the [facility]. The patient was initially diagnosed with atrial fibrillation in the ER on 05/16/18 after presenting with symptoms of palpitations and heart racing. EMS transported him to the ER and he was found to be in Afib with RVR. Of note, he was recently prescribed prednisone for his chronic back issues prior to the onset of afib. He was cardioverted in the ER and discharged on Eliquis and metoprolol. Patient had a sleep study 11/2018 which showed severe OSA.    On follow up today, patient is s/p afib ablation with Dr Curt Bears on 03/04/20. He was discharged around 330 after his ablation but he felt throat irritation and later did develop anxiety and shortness of breath.  He called EMS and was noted to be quite anxious needing sedation and required BiPAP.  In the emergency room, he was found to have pulmonary edema.  He was also found to be positive for RSV. He was hospitalized from 11/27-12/1/21 for flash pulmonary edema and viral PNA. He did have episodes of rapid afib while hospitalized and was started on amiodarone. He is back in SR and overall feels well despite a lingering cough. Denies CP, swallowing, or groin issues.  Today, he denies symptoms of palpitations, chest pain, orthopnea, PND, lower extremity edema, dizziness, presyncope, syncope, bleeding, or neurologic sequela. The patient is tolerating medications without difficulties and is otherwise without complaint today.    Atrial Fibrillation Risk Factors:  he does have symptoms or diagnosis of sleep apnea. He is compliant with CPAP therapy.  he does not have a history of rheumatic fever. he does have a history of alcohol use. 2-3 drinks daily The patient does not have a history of early  familial atrial fibrillation or other arrhythmias.  he has a BMI of Body mass index is 34.69 kg/m.[name] Filed Weights   03/14/20 0929  Weight: 109.7 kg    Family History  Problem Relation Age of Onset  . Alcohol abuse Father   . Lung cancer Father      Atrial Fibrillation Management history:  Previous antiarrhythmic drugs: amiodarone Previous cardioversions: 05/16/18 Previous ablations: 03/04/20 CHADS2VASC score: 0 Anticoagulation history: Eliquis   Past Medical History:  Diagnosis Date  . A-fib ([place])   . BPH with urinary obstruction   . Erectile dysfunction   . Hepatitis B infection    resolved   . Pulmonary edema 03/05/2020   Past Surgical History:  Procedure Laterality Date  . ATRIAL FIBRILLATION ABLATION N/A 03/04/2020   Procedure: ATRIAL FIBRILLATION ABLATION;  Surgeon: [name], MD;  Location: [facility];  Service: Cardiovascular;  Laterality: N/A;  . CARDIOVERSION  05/16/2018  . COLONOSCOPY  07-18-11    per Dr. Sharlett Iles, diverticulosis only, repeat in 10 yrs   . LUMBAR MICRODISCECTOMY  2002    Current Outpatient Medications  Medication Sig Dispense Refill  . amiodarone (PACERONE) 400 MG tablet Take 1 tablet (400 mg total) by mouth 2 (two) times daily. 60 tablet 0  . apixaban (ELIQUIS) 5 MG TABS tablet Take 1 tablet (5 mg total) by mouth 2 (two) times daily. 60 tablet 3  . diltiazem (CARDIZEM CD) 360 MG 24 hr capsule Take 1 capsule (360 mg total) by mouth daily. 30 capsule 3  .  guaiFENesin (ROBITUSSIN) 100 MG/5ML SOLN Take 5 mLs (100 mg total) by mouth every 6 (six) hours. 236 mL 0  . ibuprofen (ADVIL,MOTRIN) 400 MG tablet Take 400 mg by mouth daily as needed for mild pain or moderate pain (Lower pain).     . metoprolol succinate (TOPROL-XL) 50 MG 24 hr tablet Take 1 tablet (50 mg total) by mouth daily. Take with or immediately following a meal. 30 tablet 1  . Omega-3 Fatty Acids (OMEGA-3 FISH OIL EX ST) 880 MG CAPS Take by mouth.    .  tadalafil (CIALIS) 20 MG tablet Take 1 tablet (20 mg total) by mouth as needed for erectile dysfunction. 10 tablet 11   No current facility-administered medications for this encounter.    Allergies  Allergen Reactions  . Dextromethorphan Other (See Comments)    Per patient "feels like I'm having a heart attack."  Tolerates plain guaifenesin.  [name] Penicillins     Not sure but immediate family members are allergic  . Prednisone Palpitations    Patient "put me in afib"    Social History   Socioeconomic History  . Marital status: Divorced    Spouse name: Not on file  . Number of children: Not on file  . Years of education: Not on file  . Highest education level: Not on file  Occupational History  . Not on file  Tobacco Use  . Smoking status: Former Research scientist (life sciences)  . Smokeless tobacco: Never Used  Vaping Use  . Vaping Use: Never used  Substance and Sexual Activity  . Alcohol use: Yes    Alcohol/week: 2.0 - 4.0 standard drinks    Types: 1 - 2 Glasses of wine, 1 - 2 Cans of beer per week  . Drug use: No  . Sexual activity: Not on file  Other Topics Concern  . Not on file  Social History Narrative  . Not on file   Social Determinants of Health   Financial Resource Strain:   . Difficulty of Paying Living Expenses: Not on file  Food Insecurity:   . Worried About Charity fundraiser in the Last Year: Not on file  . Ran Out of Food in the Last Year: Not on file  Transportation Needs:   . Lack of Transportation (Medical): Not on file  . Lack of Transportation (Non-Medical): Not on file  Physical Activity:   . Days of Exercise per Week: Not on file  . Minutes of Exercise per Session: Not on file  Stress:   . Feeling of Stress : Not on file  Social Connections:   . Frequency of Communication with Friends and Family: Not on file  . Frequency of Social Gatherings with Friends and Family: Not on file  . Attends Religious Services: Not on file  . Active Member of Clubs or  Organizations: Not on file  . Attends Archivist Meetings: Not on file  . Marital Status: Not on file  Intimate Partner Violence:   . Fear of Current or Ex-Partner: Not on file  . Emotionally Abused: Not on file  . Physically Abused: Not on file  . Sexually Abused: Not on file     ROS- All systems are reviewed and negative except as per the HPI above.  Physical Exam: Vitals:   03/14/20 0929  BP: 122/70  Pulse: 66  Weight: 109.7 kg  Height: 5\' 10"  (1.778 m)    GEN- The patient is well appearing obese male, alert and oriented x 3 today.  HEENT-head normocephalic, atraumatic, sclera clear, conjunctiva pink, hearing intact, trachea midline. Lungs- Clear to ausculation bilaterally, normal work of breathing Heart- Regular rate and rhythm, no murmurs, rubs or gallops  GI- soft, NT, ND, + BS Extremities- no clubbing, cyanosis, or edema MS- no significant deformity or atrophy Skin- no rash or lesion Psych- euthymic mood, full affect Neuro- strength and sensation are intact   Wt Readings from Last 3 Encounters:  03/14/20 109.7 kg  03/09/20 110.1 kg  03/04/20 113.4 kg    EKG today demonstrates SR HR 66, PR 190, QRS 90, QTc 480  Echo 10/02/18 demonstrated 1. The left ventricle has normal systolic function with an ejection  fraction of 60-65%. The cavity size was normal. Left ventricular diastolic  Doppler parameters are consistent with impaired relaxation. No evidence of left ventricular regional wall motion abnormalities.  2. The right ventricle has normal systolic function. The cavity was  normal. There is no increase in right ventricular wall thickness.  3. No evidence of mitral valve stenosis. No significant mitral  regurgitation.  4. The aortic valve is tricuspid. Mild calcification of the aortic valve.  No stenosis of the aortic valve.  5. The aortic root is normal in size and structure.  6. The IVC is normal in size. No complete TR doppler jet so unable  to  estimate PA systolic pressure.   Epic records are reviewed at length today  Assessment and Plan:  1. Persistent atrial fibrillation S/p afib ablation 03/04/20 with ERAF in setting of flash pulmonary edema and viral PNA. Patient back in SR today. Continue diltiazem 360 mg daily Continue Toprol 50 mg daily Decrease amiodarone to 400 mg daily. Anticipate weaning medication as he maintains SR. Continue Eliquis 5 mg BID with no missed doses for at least 3 months post ablation.   This patients CHA2DS2-VASc Score and unadjusted Ischemic Stroke Rate (% per year) is equal to 0.2 % stroke rate/year from a score of 0  Above score calculated as 1 point each if present [CHF, HTN, DM, Vascular=MI/PAD/Aortic Plaque, Age if 65-74, or Male] Above score calculated as 2 points each if present [Age > 75, or Stroke/TIA/TE]  2. Obesity Body mass index is 34.69 kg/m. Lifestyle modification was discussed and encouraged including regular physical activity and weight reduction.  3. OSA Patient reports compliance with CPAP therapy. Followed by Dr Radford Pax   Follow up with the AF clinic as scheduled.    [facility] [address] [place], [place] [address] [phone] 03/14/2020 9:34 AM

## 2020-03-14 ENCOUNTER — Encounter (HOSPITAL_COMMUNITY): Payer: Self-pay | Admitting: Physician Assistant

## 2020-03-14 ENCOUNTER — Ambulatory Visit (HOSPITAL_COMMUNITY)
Admit: 2020-03-14 | Discharge: 2020-03-14 | Disposition: A | Discharge status: Home / Self Care | Payer: 59 | Attending: Physician Assistant | Admitting: Physician Assistant

## 2020-03-14 ENCOUNTER — Other Ambulatory Visit: Payer: Self-pay

## 2020-03-14 VITALS — BP 122/70 | HR 66 | Ht 70.0 in | Wt 241.8 lb

## 2020-03-14 DIAGNOSIS — Z87891 Personal history of nicotine dependence: Secondary | ICD-10-CM | POA: Insufficient documentation

## 2020-03-14 DIAGNOSIS — I4819 Other persistent atrial fibrillation: Secondary | ICD-10-CM | POA: Insufficient documentation

## 2020-03-14 DIAGNOSIS — G4733 Obstructive sleep apnea (adult) (pediatric): Secondary | ICD-10-CM | POA: Diagnosis not present

## 2020-03-14 DIAGNOSIS — Z6834 Body mass index (BMI) 34.0-34.9, adult: Secondary | ICD-10-CM | POA: Insufficient documentation

## 2020-03-14 DIAGNOSIS — I4891 Unspecified atrial fibrillation: Secondary | ICD-10-CM | POA: Diagnosis not present

## 2020-03-14 DIAGNOSIS — Z7901 Long term (current) use of anticoagulants: Secondary | ICD-10-CM | POA: Insufficient documentation

## 2020-03-14 DIAGNOSIS — E669 Obesity, unspecified: Secondary | ICD-10-CM | POA: Insufficient documentation

## 2020-03-14 MED ORDER — AMIODARONE HCL 400 MG PO TABS
400.0000 mg | ORAL_TABLET | Freq: Every day | ORAL | 1 refills | Status: DC
Start: 2020-03-14 — End: 2020-04-04

## 2020-03-14 NOTE — Telephone Encounter (Signed)
I cannot give him any sort of work note until I see him. When I see him on the 8th, I could write a retroactive note to cover him though

## 2020-03-14 NOTE — Patient Instructions (Signed)
Decrease amiodarone to 400mg  once a day

## 2020-03-14 NOTE — Telephone Encounter (Signed)
I see he had a visit this morning with Dr. Curt Bears, so I guess the issue has resolved

## 2020-03-14 NOTE — Telephone Encounter (Signed)
I don't think the IV actually infiltrated, but they often cause inflammation to the walls of the veins so they swell. This usually resolves on its own over days to weeks. Moist hot compresses can help

## 2020-03-15 NOTE — Telephone Encounter (Signed)
Left message return call

## 2020-03-16 ENCOUNTER — Encounter: Payer: Self-pay | Admitting: Family Medicine

## 2020-03-16 ENCOUNTER — Ambulatory Visit: Payer: 59 | Admitting: Family Medicine

## 2020-03-16 ENCOUNTER — Other Ambulatory Visit: Payer: Self-pay

## 2020-03-16 VITALS — BP 130/84 | HR 64 | Temp 98.7°F | Ht 70.0 in | Wt 239.0 lb

## 2020-03-16 DIAGNOSIS — J210 Acute bronchiolitis due to respiratory syncytial virus: Secondary | ICD-10-CM | POA: Diagnosis not present

## 2020-03-16 DIAGNOSIS — J810 Acute pulmonary edema: Secondary | ICD-10-CM | POA: Diagnosis not present

## 2020-03-16 DIAGNOSIS — I480 Paroxysmal atrial fibrillation: Secondary | ICD-10-CM

## 2020-03-16 NOTE — Progress Notes (Signed)
° °  Subjective:    Patient ID: Michael Garner, male    DOB: [birth year], 64 y.o.   MRN: [id number]  HPI Here to follow up a hospital stay from 03-04-20 to 03-05-20 for RSV pneumonia, atrial fibrillation with RVR, and flash pulmonary edema. He presented with extreme SOB but not chest pain. He was diuresed with IV Lasix. He underwent cardiac ablation, and he was in sinus rhythm at the time of DC. The RSV was managed conservatively and no antibiotics or steroids were given. He had been on Eliquis the whole time, and this was continued. His Diltiazem was increased to 360 mg daily. He was started on Amiodarone at 400 mg BID initially, and then this was reduced to daily. Currently he feels great, though he still has a slight cough.    Review of Systems  Constitutional: Negative.   Respiratory: Positive for cough. Negative for choking, chest tightness, shortness of breath and wheezing.   Cardiovascular: Negative.        Objective:   Physical Exam Constitutional:      Appearance: Normal appearance. He is not ill-appearing.  Cardiovascular:     Rate and Rhythm: Normal rate and regular rhythm.     Pulses: Normal pulses.     Heart sounds: Normal heart sounds.  Pulmonary:     Effort: Pulmonary effort is normal.     Breath sounds: Normal breath sounds.  Musculoskeletal:     Right lower leg: No edema.     Left lower leg: No edema.  Neurological:     Mental Status: He is alert.           Assessment & Plan:  He is doing well after an RSV pneumonia. The atrial fibrillation is controlled and he is in NSR. The BP is stable. He will follow up with Dr. Curt Bears.  Alysia Penna, MD

## 2020-04-04 ENCOUNTER — Ambulatory Visit (HOSPITAL_COMMUNITY)
Admission: RE | Admit: 2020-04-04 | Discharge: 2020-04-04 | Disposition: A | Discharge status: Home / Self Care | Payer: 59 | Source: Ambulatory Visit | Attending: Physician Assistant | Admitting: Physician Assistant

## 2020-04-04 ENCOUNTER — Other Ambulatory Visit: Payer: Self-pay

## 2020-04-04 ENCOUNTER — Ambulatory Visit (HOSPITAL_COMMUNITY): Payer: 59 | Admitting: Physician Assistant

## 2020-04-04 ENCOUNTER — Other Ambulatory Visit: Payer: Self-pay | Admitting: Physician Assistant

## 2020-04-04 ENCOUNTER — Encounter (HOSPITAL_COMMUNITY): Payer: Self-pay | Admitting: Physician Assistant

## 2020-04-04 VITALS — BP 122/82 | HR 64 | Ht 70.0 in | Wt 248.6 lb

## 2020-04-04 DIAGNOSIS — Z888 Allergy status to other drugs, medicaments and biological substances status: Secondary | ICD-10-CM | POA: Insufficient documentation

## 2020-04-04 DIAGNOSIS — Z79899 Other long term (current) drug therapy: Secondary | ICD-10-CM | POA: Diagnosis not present

## 2020-04-04 DIAGNOSIS — E669 Obesity, unspecified: Secondary | ICD-10-CM | POA: Insufficient documentation

## 2020-04-04 DIAGNOSIS — Z87891 Personal history of nicotine dependence: Secondary | ICD-10-CM | POA: Diagnosis not present

## 2020-04-04 DIAGNOSIS — Z7901 Long term (current) use of anticoagulants: Secondary | ICD-10-CM | POA: Diagnosis not present

## 2020-04-04 DIAGNOSIS — G4733 Obstructive sleep apnea (adult) (pediatric): Secondary | ICD-10-CM | POA: Diagnosis not present

## 2020-04-04 DIAGNOSIS — Z9989 Dependence on other enabling machines and devices: Secondary | ICD-10-CM | POA: Insufficient documentation

## 2020-04-04 DIAGNOSIS — Z6835 Body mass index (BMI) 35.0-35.9, adult: Secondary | ICD-10-CM | POA: Diagnosis not present

## 2020-04-04 DIAGNOSIS — I4819 Other persistent atrial fibrillation: Secondary | ICD-10-CM | POA: Diagnosis present

## 2020-04-04 MED ORDER — AMIODARONE HCL 400 MG PO TABS
200.0000 mg | ORAL_TABLET | Freq: Every day | ORAL | 1 refills | Status: DC
Start: 2020-04-04 — End: 2020-06-09

## 2020-04-04 NOTE — Progress Notes (Signed)
Primary Care Physician: Nelwyn Salisbury, MD Referring Physician: [facility] Primary EP: Dr Margie Billet Michael Garner is a 64 y.o. male with a history of paroxysmal atrial fibrillation and OSA who presents for follow up in the [facility]. The patient was initially diagnosed with atrial fibrillation in the ER on 05/16/18 after presenting with symptoms of palpitations and heart racing. EMS transported him to the ER and he was found to be in Afib with RVR. Of note, he was recently prescribed prednisone for his chronic back issues prior to the onset of afib. He was cardioverted in the ER and discharged on Eliquis and metoprolol. Patient had a sleep study 11/2018 which showed severe OSA.    Patient is s/p afib ablation with Dr Elberta Fortis on 03/04/20. He was discharged around 330 after his ablation but he felt throat irritation and later did develop anxiety and shortness of breath.  He called EMS and was noted to be quite anxious needing sedation and required BiPAP.  In the emergency room, he was found to have pulmonary edema.  He was also found to be positive for RSV. He was hospitalized from 11/27-12/1/21 for flash pulmonary edema and viral PNA. He did have episodes of rapid afib while hospitalized and was started on amiodarone.   On follow up today, patient reports doing very well since his last visit. He denies any heart racing or palpitations. He denies any bleeding issues in anticoagulation. His cough has resolved.   Today, he denies symptoms of palpitations, chest pain, orthopnea, PND, lower extremity edema, dizziness, presyncope, syncope, bleeding, or neurologic sequela. The patient is tolerating medications without difficulties and is otherwise without complaint today.    Atrial Fibrillation Risk Factors:  he does have symptoms or diagnosis of sleep apnea. He is compliant with CPAP therapy.  he does not have a history of rheumatic fever. he does have a history of  alcohol use. 2-3 drinks daily The patient does not have a history of early familial atrial fibrillation or other arrhythmias.  he has a BMI of Body mass index is 35.67 kg/m.[name] Filed Weights   04/04/20 1014  Weight: 112.8 kg    Family History  Problem Relation Age of Onset   Alcohol abuse Father    Lung cancer Father      Atrial Fibrillation Management history:  Previous antiarrhythmic drugs: amiodarone Previous cardioversions: 05/16/18 Previous ablations: 03/04/20 CHADS2VASC score: 0 Anticoagulation history: Eliquis   Past Medical History:  Diagnosis Date   A-fib [facility])    BPH with urinary obstruction    Erectile dysfunction    Hepatitis B infection    resolved    Pulmonary edema 03/05/2020   Past Surgical History:  Procedure Laterality Date   ATRIAL FIBRILLATION ABLATION N/A 03/04/2020   Procedure: ATRIAL FIBRILLATION ABLATION;  Surgeon: Regan Lemming, MD;  Location: [facility];  Service: Cardiovascular;  Laterality: N/A;   CARDIOVERSION  05/16/2018   COLONOSCOPY  07-18-11    per Dr. Jarold Motto, diverticulosis only, repeat in 10 yrs    LUMBAR MICRODISCECTOMY  2002    Current Outpatient Medications  Medication Sig Dispense Refill   apixaban (ELIQUIS) 5 MG TABS tablet Take 1 tablet (5 mg total) by mouth 2 (two) times daily. 60 tablet 3   diltiazem (CARDIZEM CD) 360 MG 24 hr capsule Take 1 capsule (360 mg total) by mouth daily. 30 capsule 3   ibuprofen (ADVIL,MOTRIN) 400 MG tablet Take 400 mg by mouth  daily as needed for mild pain or moderate pain (Lower pain).      metoprolol succinate (TOPROL-XL) 50 MG 24 hr tablet Take 1 tablet (50 mg total) by mouth daily. Take with or immediately following a meal. 30 tablet 1   Omega-3 Fatty Acids (OMEGA-3 FISH OIL EX ST) 880 MG CAPS Take by mouth.     tadalafil (CIALIS) 20 MG tablet Take 1 tablet (20 mg total) by mouth as needed for erectile dysfunction. 10 tablet 11   amiodarone (PACERONE) 400 MG  tablet Take 0.5 tablets (200 mg total) by mouth daily. 30 tablet 1   No current facility-administered medications for this encounter.    Allergies  Allergen Reactions   Dextromethorphan Other (See Comments)    Per patient "feels like I'm having a heart attack."  Tolerates plain guaifenesin.   Penicillins     Not sure but immediate family members are allergic   Prednisone Palpitations    Patient "put me in afib"    Social History   Socioeconomic History   Marital status: Divorced    Spouse name: Not on file   Number of children: Not on file   Years of education: Not on file   Highest education level: Not on file  Occupational History   Not on file  Tobacco Use   Smoking status: Former Smoker   Smokeless tobacco: Never Used  Vaping Use   Vaping Use: Never used  Substance and Sexual Activity   Alcohol use: Yes    Alcohol/week: 2.0 - 4.0 standard drinks    Types: 1 - 2 Glasses of wine, 1 - 2 Cans of beer per week   Drug use: No   Sexual activity: Not on file  Other Topics Concern   Not on file  Social History Narrative   Not on file   Social Determinants of Health   Financial Resource Strain: Not on file  Food Insecurity: Not on file  Transportation Needs: Not on file  Physical Activity: Not on file  Stress: Not on file  Social Connections: Not on file  Intimate Partner Violence: Not on file     ROS- All systems are reviewed and negative except as per the HPI above.  Physical Exam: Vitals:   04/04/20 1014  BP: 122/82  Pulse: 64  Weight: 112.8 kg  Height: 5\' 10"  (1.778 m)    GEN- The patient is well appearing obese male, alert and oriented x 3 today.   HEENT-head normocephalic, atraumatic, sclera clear, conjunctiva pink, hearing intact, trachea midline. Lungs- Clear to ausculation bilaterally, normal work of breathing Heart- Regular rate and rhythm, no murmurs, rubs or gallops  GI- soft, NT, ND, + BS Extremities- no clubbing, cyanosis,  or edema MS- no significant deformity or atrophy Skin- no rash or lesion Psych- euthymic mood, full affect Neuro- strength and sensation are intact   Wt Readings from Last 3 Encounters:  04/04/20 112.8 kg  03/16/20 108.4 kg  03/14/20 109.7 kg    EKG today demonstrates SR HR 64, PR 176, QRS 90, QTc 443  Echo 10/02/18 demonstrated 1. The left ventricle has normal systolic function with an ejection  fraction of 60-65%. The cavity size was normal. Left ventricular diastolic  Doppler parameters are consistent with impaired relaxation. No evidence of left ventricular regional wall motion abnormalities.  2. The right ventricle has normal systolic function. The cavity was  normal. There is no increase in right ventricular wall thickness.  3. No evidence of mitral valve stenosis.  No significant mitral  regurgitation.  4. The aortic valve is tricuspid. Mild calcification of the aortic valve.  No stenosis of the aortic valve.  5. The aortic root is normal in size and structure.  6. The IVC is normal in size. No complete TR doppler jet so unable to  estimate PA systolic pressure.   Epic records are reviewed at length today  Assessment and Plan:  1. Persistent atrial fibrillation S/p afib ablation 03/04/20 with ERAF in setting of flash pulmonary edema and viral PNA. Patient appears to be maintaining SR. Continue diltiazem 360 mg daily Continue Toprol 50 mg daily Decrease amiodarone to 200 mg daily. Anticipate this medication will be short term. Continue Eliquis 5 mg BID with no missed doses for at least 3 months post ablation.   This patients CHA2DS2-VASc Score and unadjusted Ischemic Stroke Rate (% per year) is equal to 0.2 % stroke rate/year from a score of 0  Above score calculated as 1 point each if present [CHF, HTN, DM, Vascular=MI/PAD/Aortic Plaque, Age if 65-74, or Male] Above score calculated as 2 points each if present [Age > 75, or Stroke/TIA/TE]  2. Obesity Body  mass index is 35.67 kg/m. Lifestyle modification was discussed and encouraged including regular physical activity and weight reduction.  3. OSA Patient reports compliance with CPAP therapy. Followed by Dr Radford Pax.   Follow up with Dr Curt Bears as scheduled.    [facility] [address] [place], [place] [address] [phone] 04/04/2020 10:26 AM

## 2020-05-04 ENCOUNTER — Other Ambulatory Visit: Payer: Self-pay | Admitting: Physician Assistant

## 2020-05-10 ENCOUNTER — Encounter: Payer: Self-pay | Admitting: Family Medicine

## 2020-05-11 NOTE — Telephone Encounter (Signed)
Set up an in person OV so I can evaluate this

## 2020-05-23 ENCOUNTER — Other Ambulatory Visit: Payer: Self-pay

## 2020-05-23 ENCOUNTER — Encounter: Payer: Self-pay | Admitting: Family Medicine

## 2020-05-23 ENCOUNTER — Ambulatory Visit: Payer: 59 | Admitting: Family Medicine

## 2020-05-23 VITALS — BP 126/80 | HR 69 | Temp 97.7°F | Ht 70.0 in | Wt 255.0 lb

## 2020-05-23 DIAGNOSIS — H9311 Tinnitus, right ear: Secondary | ICD-10-CM

## 2020-05-23 DIAGNOSIS — H9191 Unspecified hearing loss, right ear: Secondary | ICD-10-CM | POA: Diagnosis not present

## 2020-05-23 NOTE — Progress Notes (Signed)
   Subjective:    Patient ID: Michael Garner, male    DOB: [birth year], 65 y.o.   MRN: [id number]  HPI Here to check his right ear. He says he has had ringing in the ears off and on for years, but he has never had a hearing issue until 10 days ago. That day he felt a sudden "pop" in the right ear. This has not been painful. Ever since his hearing has been muffles on the right side. No dizziness. Ne recent flying or trauma hx.    Review of Systems  Constitutional: Negative.   HENT: Positive for hearing loss and tinnitus. Negative for congestion, ear discharge and ear pain.   Respiratory: Negative.   Cardiovascular: Negative.   Neurological: Negative.        Objective:   Physical Exam Constitutional:      Appearance: Normal appearance.  HENT:     Head: Normocephalic and atraumatic.     Right Ear: Tympanic membrane, ear canal and external ear normal.     Left Ear: Tympanic membrane, ear canal and external ear normal.     Nose: Nose normal.     Mouth/Throat:     Pharynx: Oropharynx is clear.  Eyes:     Conjunctiva/sclera: Conjunctivae normal.  Cardiovascular:     Rate and Rhythm: Normal rate and regular rhythm.     Pulses: Normal pulses.     Heart sounds: Normal heart sounds.  Pulmonary:     Effort: Pulmonary effort is normal.     Breath sounds: Normal breath sounds.  Lymphadenopathy:     Cervical: No cervical adenopathy.  Neurological:     Mental Status: He is alert.           Assessment & Plan:  Hearing loss and tinnitus. He will use Zyrtec and Flonase every day for the next 2 weeks and then report back to [place].  Alysia Penna, MD

## 2020-06-09 ENCOUNTER — Other Ambulatory Visit: Payer: Self-pay

## 2020-06-09 ENCOUNTER — Ambulatory Visit: Payer: 59 | Admitting: Cardiology

## 2020-06-09 ENCOUNTER — Encounter: Payer: Self-pay | Admitting: Cardiology

## 2020-06-09 VITALS — BP 120/66 | HR 63 | Ht 70.0 in | Wt 244.0 lb

## 2020-06-09 DIAGNOSIS — I4819 Other persistent atrial fibrillation: Secondary | ICD-10-CM | POA: Diagnosis not present

## 2020-06-09 NOTE — Progress Notes (Signed)
Electrophysiology Office Note   Date:  06/09/2020   ID:  Michael Garner, DOB 07-Mar-1956, MRN [id number]  PCP:  Michael Morale, MD  Cardiologist:   Primary Electrophysiologist:  Michael Mochizuki Meredith Leeds, MD    Chief Complaint: Atrial fibrillation   History of Present Illness: Michael Garner is a 65 y.o. male who is being seen today for the evaluation of atrial fibrillation at the request of Michael Morale, MD. Presenting today for electrophysiology evaluation.  He has a history of persistent atrial fibrillation and obstructive sleep apnea.  He was diagnosed with atrial fibrillation 10/09/2018 after presenting to the emergency room with palpitations.  He is now status post AF ablation on 03/04/2020.  After his ablation, he developed throat irritation consistent with anxiety and shortness of breath.  He was found to have flash pulmonary edema and RSV positive.  He was treated for a viral pneumonia.  He went into atrial fibrillation while in the hospital and was started on amiodarone.  Today, denies symptoms of palpitations, chest pain, shortness of breath, orthopnea, PND, lower extremity edema, claudication, dizziness, presyncope, syncope, bleeding, or neurologic sequela. The patient is tolerating medications without difficulties.  Since his hospitalization post ablation he has done well.  He has noted no further episodes of atrial fibrillation all of his daily activities without restriction.  He is gone back to the gym and is doing well with his workouts.   Past Medical History:  Diagnosis Date  . A-fib ([place])   . BPH with urinary obstruction   . Erectile dysfunction   . Hepatitis B infection    resolved   . Pulmonary edema 03/05/2020   Past Surgical History:  Procedure Laterality Date  . ATRIAL FIBRILLATION ABLATION N/A 03/04/2020   Procedure: ATRIAL FIBRILLATION ABLATION;  Surgeon: Michael Haw, MD;  Location: [facility];  Service: Cardiovascular;  Laterality: N/A;  .  CARDIOVERSION  05/16/2018  . COLONOSCOPY  07-18-11    per Dr. Sharlett Garner, diverticulosis only, repeat in 10 yrs   . LUMBAR MICRODISCECTOMY  2002     Current Outpatient Medications  Medication Sig Dispense Refill  . apixaban (ELIQUIS) 5 MG TABS tablet Take 1 tablet (5 mg total) by mouth 2 (two) times daily. 60 tablet 3  . diltiazem (CARDIZEM CD) 360 MG 24 hr capsule Take 1 capsule (360 mg total) by mouth daily. 30 capsule 3  . ibuprofen (ADVIL,MOTRIN) 400 MG tablet Take 400 mg by mouth daily as needed for mild pain or moderate pain (Lower pain).     . metoprolol succinate (TOPROL-XL) 50 MG 24 hr tablet TAKE ONE TABLET BY MOUTH DAILY WITH A MEAL 30 tablet 11  . Omega-3 Fatty Acids (OMEGA-3 FISH OIL EX ST) 880 MG CAPS Take by mouth.    . tadalafil (CIALIS) 20 MG tablet Take 1 tablet (20 mg total) by mouth as needed for erectile dysfunction. 10 tablet 11   No current facility-administered medications for this visit.    Allergies:   Dextromethorphan, Penicillins, and Prednisone   Social History:  The patient  reports that he has quit smoking. He has never used smokeless tobacco. He reports current alcohol use of about 2.0 - 4.0 standard drinks of alcohol per week. He reports that he does not use drugs.   Family History:  The patient's family history includes Alcohol abuse in his father; Lung cancer in his father.   ROS:  Please see the history of present illness.   Otherwise, review of systems is  positive for none.   All other systems are reviewed and negative.   PHYSICAL EXAM: VS:  BP 120/66   Pulse 63   Ht 5\' 10"  (1.778 m)   Wt 244 lb (110.7 kg)   SpO2 97%   BMI 35.01 kg/m  , BMI Body mass index is 35.01 kg/m. GEN: Well nourished, well developed, in no acute distress  HEENT: normal  Neck: no JVD, carotid bruits, or masses Cardiac: RRR; no murmurs, rubs, or gallops,no edema  Respiratory:  clear to auscultation bilaterally, normal work of breathing GI: soft, nontender, nondistended,  + BS MS: no deformity or atrophy  Skin: warm and dry Neuro:  Strength and sensation are intact Psych: euthymic mood, full affect  EKG:  EKG is ordered today. Personal review of the ekg ordered shows sinus rhythm, rate 63  Recent Labs: 03/05/2020: ALT 37; B Natriuretic Peptide 342.8 03/08/2020: Hemoglobin 14.7; Platelets 166 03/09/2020: BUN 18; Creatinine, Ser 1.04; Magnesium 1.9; Potassium 3.5; Sodium 133    Lipid Panel     Component Value Date/Time   CHOL 236 (H) 09/25/2017 1432   TRIG 81.0 09/25/2017 1432   HDL 88.30 09/25/2017 1432   CHOLHDL 3 09/25/2017 1432   VLDL 16.2 09/25/2017 1432   LDLCALC 131 (H) 09/25/2017 1432   LDLDIRECT 128.0 05/08/2013 0802     Wt Readings from Last 3 Encounters:  06/09/20 244 lb (110.7 kg)  05/23/20 255 lb (115.7 kg)  04/04/20 248 lb 9.6 oz (112.8 kg)      Other studies Reviewed: Additional studies/ records that were reviewed today include: TTE 03/05/2020 Review of the above records today demonstrates:  1. Left ventricular ejection fraction, by estimation, is 50 to 55%. The  left ventricle has low normal function. The left ventricle has no regional  wall motion abnormalities. The left ventricular internal cavity size was  mildly dilated. There is mild  concentric left ventricular hypertrophy. Left ventricular diastolic  function could not be evaluated. There is the interventricular septum is  flattened in systole, consistent with right ventricular pressure overload.  2. Right ventricular systolic function is mildly reduced. The right  ventricular size is mildly enlarged. There is moderately elevated  pulmonary artery systolic pressure.  3. The mitral valve is normal in structure. Trivial mitral valve  regurgitation. No evidence of mitral stenosis.  4. The aortic valve is normal in structure. Aortic valve regurgitation is  not visualized. No aortic stenosis is present.  5. The inferior vena cava is dilated in size with <50%  respiratory  variability, suggesting right atrial pressure of 15 mmHg.    ASSESSMENT AND PLAN:  1.  Persistent atrial fibrillation: Currently on diltiazem, amiodarone and Eliquis.  CHA2DS2-VASc of 0.  Status post AF ablation 03/04/2020.  High risk medication monitoring.  He is remained in sinus rhythm since the few days after his ablation.  We Michael Garner stop amiodarone today.  2.  Obstructive sleep apnea: Compliant with CPAP  3.  Obesity: Diet and exercise encouraged    Current medicines are reviewed at length with the patient today.   The patient does not have concerns regarding his medicines.  The following changes were made today: Stop amiodarone  Labs/ tests ordered today include:  Orders Placed This Encounter  Procedures  . EKG 12-Lead     Disposition:   FU with Michael Garner 3 months  Signed, Michael Vest Meredith Leeds, MD  06/09/2020 3:07 PM     [facility] [address] [place] [address] (  [phone] (office) [phone] (fax) There is a

## 2020-06-09 NOTE — Patient Instructions (Signed)
Medication Instructions:  Your physician has recommended you make the following change in your medication:  1. STOP Amiodarone  *If you need a refill on your cardiac medications before your next appointment, please call your pharmacy*   Lab Work: None ordered   Testing/Procedures: None ordered   Follow-Up: At [facility], you and your health needs are our priority.  As part of our continuing mission to provide you with exceptional heart care, we have created designated Provider Care Teams.  These Care Teams include your primary Cardiologist (physician) and Advanced Practice Providers (APPs -  Physician Assistants and Nurse Practitioners) who all work together to provide you with the care you need, when you need it.   Your next appointment:   3 month(s)  The format for your next appointment:   In Person  Provider:   Will Camnitz, MD    Thank you for choosing [facility]!!   Elpidia Karn, RN [phone]    

## 2020-06-12 ENCOUNTER — Other Ambulatory Visit (HOSPITAL_COMMUNITY): Payer: Self-pay | Admitting: Physician Assistant

## 2020-07-06 ENCOUNTER — Other Ambulatory Visit: Payer: Self-pay | Admitting: Physician Assistant

## 2020-07-13 MED ORDER — DILTIAZEM HCL ER COATED BEADS 360 MG PO CP24: 360.0000 mg | ORAL_CAPSULE | Freq: Every day | ORAL | 1 refills | Status: DC

## 2020-08-31 ENCOUNTER — Ambulatory Visit: Payer: Medicare HMO | Admitting: Podiatry

## 2020-09-12 ENCOUNTER — Other Ambulatory Visit: Payer: Self-pay

## 2020-09-12 ENCOUNTER — Encounter: Payer: Self-pay | Admitting: Cardiology

## 2020-09-12 ENCOUNTER — Ambulatory Visit: Payer: Medicare HMO | Admitting: Cardiology

## 2020-09-12 VITALS — BP 130/68 | HR 69 | Ht 70.0 in | Wt 250.0 lb

## 2020-09-12 DIAGNOSIS — I4819 Other persistent atrial fibrillation: Secondary | ICD-10-CM

## 2020-09-12 NOTE — Progress Notes (Signed)
Electrophysiology Office Note   Date:  09/12/2020   ID:  Michael Garner, DOB 24-Sep-1955, MRN [id number]  PCP:  Laurey Morale, MD  Cardiologist:   Primary Electrophysiologist:  Syvilla Martin Meredith Leeds, MD    Chief Complaint: Atrial fibrillation   History of Present Illness: Michael Garner is a 65 y.o. male who is being seen today for the evaluation of atrial fibrillation at the request of Laurey Morale, MD. Presenting today for electrophysiology evaluation.  He has a history significant for persistent atrial fibrillation, obstructive sleep apnea.  He was diagnosed with atrial fibrillation 10/09/2018 after presenting to the emergency room with palpitations.  He is status post AF ablation 03/04/2020.  Post ablation, he was noted to have throat irritation associated with anxiety and shortness of breath.  He went into flash pulmonary edema and was found to be RSV positive.  He was treated for viral pneumonia.  He went into atrial fibrillation while he was in the hospital and was started on amiodarone.  Amiodarone has since been stopped.  Today, denies symptoms of palpitations, chest pain, shortness of breath, orthopnea, PND, lower extremity edema, claudication, dizziness, presyncope, syncope, bleeding, or neurologic sequela. The patient is tolerating medications without difficulties.  He has done well over the last 3 months.  He has had no chest pain or shortness of breath.  He is noted no further episodes of atrial fibrillation.  He is able to exercise, going to the gym quite often.  He has no acute complaints.   Past Medical History:  Diagnosis Date  . A-fib ([place])   . BPH with urinary obstruction   . Erectile dysfunction   . Hepatitis B infection    resolved   . Pulmonary edema 03/05/2020   Past Surgical History:  Procedure Laterality Date  . ATRIAL FIBRILLATION ABLATION N/A 03/04/2020   Procedure: ATRIAL FIBRILLATION ABLATION;  Surgeon: [name], MD;  Location: [facility];   Service: Cardiovascular;  Laterality: N/A;  . CARDIOVERSION  05/16/2018  . COLONOSCOPY  07-18-11    per Dr. Sharlett Iles, diverticulosis only, repeat in 10 yrs   . LUMBAR MICRODISCECTOMY  2002     Current Outpatient Medications  Medication Sig Dispense Refill  . diltiazem (CARDIZEM CD) 360 MG 24 hr capsule Take 1 capsule (360 mg total) by mouth daily. 90 capsule 1  . ibuprofen (ADVIL,MOTRIN) 400 MG tablet Take 400 mg by mouth daily as needed for mild pain or moderate pain (Lower pain).     . Omega-3 Fatty Acids (OMEGA-3 FISH OIL EX ST) 880 MG CAPS Take by mouth.    . tadalafil (CIALIS) 20 MG tablet Take 1 tablet (20 mg total) by mouth as needed for erectile dysfunction. 10 tablet 11   No current facility-administered medications for this visit.    Allergies:   Dextromethorphan, Penicillins, and Prednisone   Social History:  The patient  reports that he has quit smoking. He has never used smokeless tobacco. He reports current alcohol use of about 2.0 - 4.0 standard drinks of alcohol per week. He reports that he does not use drugs.   Family History:  The patient's family history includes Alcohol abuse in his father; Lung cancer in his father.   ROS:  Please see the history of present illness.   Otherwise, review of systems is positive for none.   All other systems are reviewed and negative.   PHYSICAL EXAM: VS:  BP 130/68   Pulse 69   Ht 5\' 10"  (  1.778 m)   Wt 250 lb (113.4 kg)   BMI 35.87 kg/m  , BMI Body mass index is 35.87 kg/m. GEN: Well nourished, well developed, in no acute distress  HEENT: normal  Neck: no JVD, carotid bruits, or masses Cardiac: RRR; no murmurs, rubs, or gallops,no edema  Respiratory:  clear to auscultation bilaterally, normal work of breathing GI: soft, nontender, nondistended, + BS MS: no deformity or atrophy  Skin: warm and dry Neuro:  Strength and sensation are intact Psych: euthymic mood, full affect  EKG:  EKG is ordered today. Personal review of  the ekg ordered shows sinus rhythm, rate 69  Recent Labs: 03/05/2020: ALT 37; B Natriuretic Peptide 342.8 03/08/2020: Hemoglobin 14.7; Platelets 166 03/09/2020: BUN 18; Creatinine, Ser 1.04; Magnesium 1.9; Potassium 3.5; Sodium 133    Lipid Panel     Component Value Date/Time   CHOL 236 (H) 09/25/2017 1432   TRIG 81.0 09/25/2017 1432   HDL 88.30 09/25/2017 1432   CHOLHDL 3 09/25/2017 1432   VLDL 16.2 09/25/2017 1432   LDLCALC 131 (H) 09/25/2017 1432   LDLDIRECT 128.0 05/08/2013 0802     Wt Readings from Last 3 Encounters:  09/12/20 250 lb (113.4 kg)  06/09/20 244 lb (110.7 kg)  05/23/20 255 lb (115.7 kg)      Other studies Reviewed: Additional studies/ records that were reviewed today include: TTE 03/05/2020 Review of the above records today demonstrates:  1. Left ventricular ejection fraction, by estimation, is 50 to 55%. The  left ventricle has low normal function. The left ventricle has no regional  wall motion abnormalities. The left ventricular internal cavity size was  mildly dilated. There is mild  concentric left ventricular hypertrophy. Left ventricular diastolic  function could not be evaluated. There is the interventricular septum is  flattened in systole, consistent with right ventricular pressure overload.  2. Right ventricular systolic function is mildly reduced. The right  ventricular size is mildly enlarged. There is moderately elevated  pulmonary artery systolic pressure.  3. The mitral valve is normal in structure. Trivial mitral valve  regurgitation. No evidence of mitral stenosis.  4. The aortic valve is normal in structure. Aortic valve regurgitation is  not visualized. No aortic stenosis is present.  5. The inferior vena cava is dilated in size with <50% respiratory  variability, suggesting right atrial pressure of 15 mmHg.    ASSESSMENT AND PLAN:  1.  Persistent atrial fibrillation: Currently on diltiazem, Eliquis.  CHA2DS2-VASc of 1.   Amiodarone was stopped at the last visit.  Status post ablation 03/04/2020.  He has had no further episodes of atrial fibrillation.  He is also noted having difficulty getting his heart rate elevated when he exercises.  We Michael Garner thus plan to stop both metoprolol and Eliquis.  2.  Obstructive sleep apnea: Compliant with CPAP  3.  Obesity: And exercise encouraged    Current medicines are reviewed at length with the patient today.   The patient does not have concerns regarding his medicines.  The following changes were made today: Stop metoprolol, Eliquis  Labs/ tests ordered today include:  Orders Placed This Encounter  Procedures  . EKG 12-Lead     Disposition:   FU with Michael Garner 6 months  Signed, Timberlyn Pickford Meredith Leeds, MD  09/12/2020 11:57 AM     [facility] [address] [place] [address] [phone] (office) [phone] (fax) There is a

## 2020-09-12 NOTE — Patient Instructions (Signed)
Medication Instructions:  Your physician has recommended you make the following change in your medication:  1. STOP Eliquis 2. STOP Toprol  *If you need a refill on your cardiac medications before your next appointment, please call your pharmacy*   Lab Work: None ordered   Testing/Procedures: None ordered   Follow-Up: At [facility], you and your health needs are our priority.  As part of our continuing mission to provide you with exceptional heart care, we have created designated Provider Care Teams.  These Care Teams include your primary Cardiologist (physician) and Advanced Practice Providers (APPs -  Physician Assistants and Nurse Practitioners) who all work together to provide you with the care you need, when you need it.  Your next appointment:   6 month(s)  The format for your next appointment:   In Person  Provider:   Allegra Lai, MD    Thank you for choosing [facility]!!   Trinidad Curet, RN [phone]

## 2020-09-14 ENCOUNTER — Ambulatory Visit: Payer: Medicare HMO | Admitting: Podiatry

## 2020-09-14 ENCOUNTER — Other Ambulatory Visit: Payer: Self-pay

## 2020-09-14 DIAGNOSIS — M79674 Pain in right toe(s): Secondary | ICD-10-CM

## 2020-09-14 DIAGNOSIS — B351 Tinea unguium: Secondary | ICD-10-CM | POA: Diagnosis not present

## 2020-09-14 MED ORDER — TERBINAFINE HCL 250 MG PO TABS: 250.0000 mg | ORAL_TABLET | Freq: Every day | ORAL | 0 refills | Status: DC

## 2020-09-20 NOTE — Progress Notes (Signed)
   Subjective: 65 y.o. male presenting today for new complaint regarding a nail problem to the bilateral feet right is greater than the left.  Patient has been noticing over the past year a gradual onset of discoloration and thickening to the toenails.  He states that the toenails are starting to grow upward.  The right foot is worse.  He has not done anything for treatment.  He presents for further treatment evaluation  Past Medical History:  Diagnosis Date   A-fib [facility])    BPH with urinary obstruction    Erectile dysfunction    Hepatitis B infection    resolved    Pulmonary edema 03/05/2020    Objective: Physical Exam General: The patient is alert and oriented x3 in no acute distress.  Dermatology: Hyperkeratotic, discolored, thickened, onychodystrophy noted 1-5 right. Skin is warm, dry and supple bilateral lower extremities. Negative for open lesions or macerations.  Vascular: Palpable pedal pulses bilaterally. No edema or erythema noted. Capillary refill within normal limits.  Neurological: Epicritic and protective threshold grossly intact bilaterally.   Musculoskeletal Exam: No pedal deformities noted Assessment: #1 Onychomycosis of toenails 1-5 right   Plan of Care:  #1 Patient was evaluated. #2  Today we discussed different treatment options including oral, topical, and laser antifungal treatment modalities.  The patient opts for oral antifungal Lamisil.  The patient denies a history of liver pathology or symptoms. #3 prescription for Lamisil 250 mg #90 daily #4 return to clinic 6 months   Edrick Kins, DPM [facility]  Dr. Edrick Kins, DPM    [address], [place] [address]                Office [phone]  Fax [phone]

## 2020-11-14 DIAGNOSIS — H16223 Keratoconjunctivitis sicca, not specified as Sjogren's, bilateral: Secondary | ICD-10-CM | POA: Diagnosis not present

## 2020-11-29 ENCOUNTER — Encounter: Payer: Self-pay | Admitting: Family Medicine

## 2020-11-29 NOTE — Telephone Encounter (Signed)
Ok to place referral, please advise

## 2020-11-30 NOTE — Telephone Encounter (Signed)
Make an OV with me to examine him and, if needed, I can order the scans, etc. Based on these results, we can decide the next step

## 2020-12-08 ENCOUNTER — Other Ambulatory Visit: Payer: Self-pay

## 2020-12-08 ENCOUNTER — Encounter: Payer: Self-pay | Admitting: Family Medicine

## 2020-12-08 ENCOUNTER — Ambulatory Visit (INDEPENDENT_AMBULATORY_CARE_PROVIDER_SITE_OTHER): Payer: Medicare HMO | Admitting: Family Medicine

## 2020-12-08 VITALS — BP 130/80 | HR 68 | Temp 98.2°F | Wt 247.0 lb

## 2020-12-08 DIAGNOSIS — M48062 Spinal stenosis, lumbar region with neurogenic claudication: Secondary | ICD-10-CM | POA: Diagnosis not present

## 2020-12-08 MED ORDER — DICLOFENAC SODIUM 75 MG PO TBEC: 75.0000 mg | DELAYED_RELEASE_TABLET | Freq: Two times a day (BID) | ORAL | 2 refills | Status: DC

## 2020-12-08 NOTE — Progress Notes (Signed)
   Subjective:    Patient ID: Michael Garner, male    DOB: [birth year], 65 y.o.   MRN: [id number]  HPI Here for 2 weeks of intermittent dull aching pains in both buttocks and this radiates down the backs of both thighs. His legs feel somewhat weak and both feet get numb at times. Ibuprofen helps temporarily. He had a discectomy surgery involving L2-3, L3-4, and L4-5 in 2002 at [facility]. He then saw Dr. Sherwood Gambler in 2019 for low back pain , and he was treated with an epidural steroid injection and with PT.    Review of Systems  Constitutional: Negative.   Respiratory: Negative.    Cardiovascular: Negative.   Musculoskeletal:  Positive for back pain.      Objective:   Physical Exam Constitutional:      General: He is not in acute distress.    Appearance: Normal appearance.  Cardiovascular:     Rate and Rhythm: Normal rate and regular rhythm.     Pulses: Normal pulses.     Heart sounds: Normal heart sounds.  Pulmonary:     Effort: Pulmonary effort is normal.     Breath sounds: Normal breath sounds.  Musculoskeletal:     Comments: He is mildly tender on both sides of the lower back, but not over the sciatic notches. ROM is full, although full extension causes pain. SLR are negative   Neurological:     Mental Status: He is alert.          Assessment & Plan:  Low back pain consistent with a lumbar stenosis. We will use Diclofenac BID as needed for pain rather than Ibuprofen. We will also refer him back to [facility]. Alysia Penna, MD

## 2020-12-27 DIAGNOSIS — Z6833 Body mass index (BMI) 33.0-33.9, adult: Secondary | ICD-10-CM | POA: Diagnosis not present

## 2020-12-27 DIAGNOSIS — R030 Elevated blood-pressure reading, without diagnosis of hypertension: Secondary | ICD-10-CM | POA: Diagnosis not present

## 2020-12-27 DIAGNOSIS — G8929 Other chronic pain: Secondary | ICD-10-CM | POA: Diagnosis not present

## 2020-12-27 DIAGNOSIS — M5442 Lumbago with sciatica, left side: Secondary | ICD-10-CM | POA: Diagnosis not present

## 2020-12-27 DIAGNOSIS — M5441 Lumbago with sciatica, right side: Secondary | ICD-10-CM | POA: Diagnosis not present

## 2021-01-05 ENCOUNTER — Other Ambulatory Visit: Payer: Self-pay | Admitting: *Deleted

## 2021-01-05 DIAGNOSIS — M545 Low back pain, unspecified: Secondary | ICD-10-CM | POA: Diagnosis not present

## 2021-01-05 DIAGNOSIS — M5442 Lumbago with sciatica, left side: Secondary | ICD-10-CM | POA: Diagnosis not present

## 2021-01-05 MED ORDER — DILTIAZEM HCL ER COATED BEADS 360 MG PO CP24: 360.0000 mg | ORAL_CAPSULE | Freq: Every day | ORAL | 0 refills | Status: DC

## 2021-01-10 DIAGNOSIS — M48062 Spinal stenosis, lumbar region with neurogenic claudication: Secondary | ICD-10-CM | POA: Diagnosis not present

## 2021-01-10 DIAGNOSIS — Z6833 Body mass index (BMI) 33.0-33.9, adult: Secondary | ICD-10-CM | POA: Diagnosis not present

## 2021-01-10 DIAGNOSIS — M5116 Intervertebral disc disorders with radiculopathy, lumbar region: Secondary | ICD-10-CM | POA: Diagnosis not present

## 2021-01-10 DIAGNOSIS — R030 Elevated blood-pressure reading, without diagnosis of hypertension: Secondary | ICD-10-CM | POA: Diagnosis not present

## 2021-01-11 ENCOUNTER — Other Ambulatory Visit: Payer: Self-pay | Admitting: Neurosurgery

## 2021-01-23 ENCOUNTER — Other Ambulatory Visit: Payer: Self-pay | Admitting: Neurosurgery

## 2021-01-23 DIAGNOSIS — Z0100 Encounter for examination of eyes and vision without abnormal findings: Secondary | ICD-10-CM | POA: Diagnosis not present

## 2021-02-01 NOTE — Pre-Procedure Instructions (Signed)
Surgical Instructions    Your procedure is scheduled on Monday, October 31st.  Report to [facility] Main Entrance "A" at 5:30 A.M., then check in with the Admitting office.  Call this number if you have problems the morning of surgery:  [phone]   If you have any questions prior to your surgery date call [phone]: Open Monday-Friday 8am-4pm    Remember:  Do not eat or drink after midnight the night before your surgery   Take these medicines the morning of surgery with A SIP OF WATER  cetirizine (ZYRTEC)  diltiazem (CARDIZEM CD) mometasone (NASONEX)-as needed  As of today, STOP taking any Aspirin (unless otherwise instructed by your surgeon) Aleve, Naproxen, Ibuprofen, Motrin, Advil, Goody's, BC's, all herbal medications, fish oil, and all vitamins. This includes: diclofenac (VOLTAREN)                      Do NOT Smoke (Tobacco/Vaping) or drink Alcohol 24 hours prior to your procedure.  If you use a CPAP at night, you may bring all equipment for your overnight stay.   Contacts, glasses, piercing's, hearing aid's, dentures or partials may not be worn into surgery, please bring cases for these belongings.    For patients admitted to the hospital, discharge time will be determined by your treatment team.   Patients discharged the day of surgery will not be allowed to drive home, and someone needs to stay with them for 24 hours.  NO VISITORS WILL BE ALLOWED IN PRE-OP WHERE PATIENTS GET READY FOR SURGERY.  ONLY 1 SUPPORT PERSON MAY BE PRESENT IN THE WAITING ROOM WHILE YOU ARE IN SURGERY.  IF YOU ARE TO BE ADMITTED, ONCE YOU ARE IN YOUR ROOM YOU WILL BE ALLOWED TWO (2) VISITORS.  Minor children may have two parents present. Special consideration for safety and communication needs will be reviewed on a case by case basis.   Special instructions:   [facility]- Preparing For Surgery  Before surgery, you can play an important role. Because skin is not sterile, your skin needs to  be as free of germs as possible. You can reduce the number of germs on your skin by washing with CHG (chlorahexidine gluconate) Soap before surgery.  CHG is an antiseptic cleaner which kills germs and bonds with the skin to continue killing germs even after washing.    Oral Hygiene is also important to reduce your risk of infection.  Remember - BRUSH YOUR TEETH THE MORNING OF SURGERY WITH YOUR REGULAR TOOTHPASTE  Please do not use if you have an allergy to CHG or antibacterial soaps. If your skin becomes reddened/irritated stop using the CHG.  Do not shave (including legs and underarms) for at least 48 hours prior to first CHG shower. It is OK to shave your face.  Please follow these instructions carefully.   Shower the NIGHT BEFORE SURGERY and the MORNING OF SURGERY  If you chose to wash your hair, wash your hair first as usual with your normal shampoo.  After you shampoo, rinse your hair and body thoroughly to remove the shampoo.  Use CHG Soap as you would any other liquid soap. You can apply CHG directly to the skin and wash gently with a scrungie or a clean washcloth.   Apply the CHG Soap to your body ONLY FROM THE NECK DOWN.  Do not use on open wounds or open sores. Avoid contact with your eyes, ears, mouth and genitals (private parts). Wash Face and genitals (private  parts)  with your normal soap.   Wash thoroughly, paying special attention to the area where your surgery will be performed.  Thoroughly rinse your body with warm water from the neck down.  DO NOT shower/wash with your normal soap after using and rinsing off the CHG Soap.  Pat yourself dry with a CLEAN TOWEL.  Wear CLEAN PAJAMAS to bed the night before surgery  Place CLEAN SHEETS on your bed the night before your surgery  DO NOT SLEEP WITH PETS.   Day of Surgery: Shower with CHG soap. Do not wear jewelry. Do not wear lotions, powders, colognes, or deodorant. Do not shave 48 hours prior to surgery.  Men may  shave face and neck. Do not bring valuables to the hospital. [facility] is not responsible for any belongings or valuables. Wear Clean/Comfortable clothing the morning of surgery Remember to brush your teeth WITH YOUR REGULAR TOOTHPASTE.   Please read over the following fact sheets that you were given.   3 days prior to your procedure or After your COVID test   You are not required to quarantine however you are required to wear a well-fitting mask when you are out and around people not in your household. If your mask becomes wet or soiled, replace with a new one.   Wash your hands often with soap and water for 20 seconds or clean your hands with an alcohol-based hand sanitizer that contains at least 60% alcohol.   Do not share personal items.   Notify your provider:  o if you are in close contact with someone who has COVID  o or if you develop a fever of 100.4 or greater, sneezing, cough, sore throat, shortness of breath or body aches.

## 2021-02-02 ENCOUNTER — Encounter (HOSPITAL_COMMUNITY)
Admission: RE | Admit: 2021-02-02 | Discharge: 2021-02-02 | Disposition: A | Discharge status: Home / Self Care | Payer: Medicare HMO | Source: Ambulatory Visit | Attending: Neurosurgery | Admitting: Neurosurgery

## 2021-02-02 ENCOUNTER — Telehealth: Payer: Self-pay

## 2021-02-02 ENCOUNTER — Encounter (HOSPITAL_COMMUNITY): Payer: Self-pay

## 2021-02-02 ENCOUNTER — Other Ambulatory Visit: Payer: Self-pay

## 2021-02-02 VITALS — BP 140/82 | HR 80 | Temp 97.7°F | Resp 18 | Ht 71.0 in | Wt 252.2 lb

## 2021-02-02 DIAGNOSIS — K759 Inflammatory liver disease, unspecified: Secondary | ICD-10-CM | POA: Diagnosis not present

## 2021-02-02 DIAGNOSIS — Z01818 Encounter for other preprocedural examination: Secondary | ICD-10-CM | POA: Diagnosis present

## 2021-02-02 DIAGNOSIS — Z20822 Contact with and (suspected) exposure to covid-19: Secondary | ICD-10-CM | POA: Diagnosis not present

## 2021-02-02 LAB — COMPREHENSIVE METABOLIC PANEL WITH GFR
ALT: 27 U/L (ref 0–44)
AST: 26 U/L (ref 15–41)
Albumin: 4.1 g/dL (ref 3.5–5.0)
Alkaline Phosphatase: 78 U/L (ref 38–126)
Anion gap: 7 (ref 5–15)
BUN: 20 mg/dL (ref 8–23)
CO2: 25 mmol/L (ref 22–32)
Calcium: 9.1 mg/dL (ref 8.9–10.3)
Chloride: 103 mmol/L (ref 98–111)
Creatinine, Ser: 0.86 mg/dL (ref 0.61–1.24)
GFR, Estimated: >60 mL/min
Glucose, Bld: 90 mg/dL (ref 70–99)
Potassium: 4.2 mmol/L (ref 3.5–5.1)
Sodium: 135 mmol/L (ref 135–145)
Total Bilirubin: 0.9 mg/dL (ref 0.3–1.2)
Total Protein: 6.8 g/dL (ref 6.5–8.1)

## 2021-02-02 LAB — CBC
HCT: 45.4 % (ref 39.0–52.0)
Hemoglobin: 15.8 g/dL (ref 13.0–17.0)
MCH: 31.6 pg (ref 26.0–34.0)
MCHC: 34.8 g/dL (ref 30.0–36.0)
MCV: 90.8 fL (ref 80.0–100.0)
Platelets: 205 10*3/uL (ref 150–400)
RBC: 5 MIL/uL (ref 4.22–5.81)
RDW: 13.4 % (ref 11.5–15.5)
WBC: 5.6 10*3/uL (ref 4.0–10.5)
nRBC: 0 % (ref 0.0–0.2)

## 2021-02-02 LAB — SURGICAL PCR SCREEN
MRSA, PCR: NEGATIVE
Staphylococcus aureus: NEGATIVE

## 2021-02-02 LAB — SARS CORONAVIRUS 2 (TAT 6-24 HRS): SARS Coronavirus 2: NEGATIVE

## 2021-02-02 NOTE — Progress Notes (Signed)
PCP -  Dr. Alysia Penna Cardiologist - Dr. [name], Electrophysiology  PPM/ICD - denies  Chest x-ray - 03/07/20 EKG - 09/12/20 Stress Test - denies ECHO - 03/05/20 Cardiac Cath - denies  Sleep Study - 2020 CPAP - wears CPAP sometimes  DM- denies  Blood Thinner Instructions: n/a (Stop diclofenac) Aspirin Instructions: n/a  ERAS Protcol - no, NPO   COVID TEST- 02/02/21 at PAT   Anesthesia review: yes, cardiac hx, clearance?  Patient denies shortness of breath, fever, cough and chest pain at PAT appointment   All instructions explained to the patient, with a verbal understanding of the material. Patient agrees to go over the instructions while at home for a better understanding. Patient also instructed to wear a mask in public after being tested for COVID-19. The opportunity to ask questions was provided.

## 2021-02-02 NOTE — Telephone Encounter (Signed)
Patient called stating that he will be having surgery and can not take diclofenac (VOLTAREN) 75 MG EC tablet ibuprofen (ADVIL) 200 MG tablet Patient would like to know what other pain medications he can take and requesting a call back.

## 2021-02-02 NOTE — Telephone Encounter (Signed)
Spoke with patient about message.    Voiced understanding.    

## 2021-02-02 NOTE — Telephone Encounter (Signed)
He can safely take Tylenol up to 3000 mg a day

## 2021-02-03 NOTE — Progress Notes (Signed)
Anesthesia Chart Review:  Follows with cardiology for history of A. fib and OSA on CPAP.  Patient underwent ablation 03/04/2020.  Post ablation, he was noted to have throat irritation associated with anxiety and shortness of breath.  He went into flash pulmonary edema and was found to be RSV positive.  He was treated for viral pneumonia.  He went into atrial fibrillation while he was in the hospital and was started on amiodarone.  Amiodarone has since been stopped.  Patient subsequently maintained sinus rhythm.  Last seen by Dr. Curt Bears 09/12/2020.  Per note, "Persistent atrial fibrillation: Currently on diltiazem, Eliquis.  CHA2DS2-VASc of 1.  Amiodarone was stopped at the last visit.  Status post ablation 03/04/2020.  He has had no further episodes of atrial fibrillation.  He is also noted having difficulty getting his heart rate elevated when he exercises.  We will thus plan to stop both metoprolol and Eliquis."  Preop labs reviewed, WNL.  EKG 09/12/2020: Sinus rhythm.  Rate 69.  TTE 03/05/2020:  1. Left ventricular ejection fraction, by estimation, is 50 to 55%. The  left ventricle has low normal function. The left ventricle has no regional  wall motion abnormalities. The left ventricular internal cavity size was  mildly dilated. There is mild  concentric left ventricular hypertrophy. Left ventricular diastolic  function could not be evaluated. There is the interventricular septum is  flattened in systole, consistent with right ventricular pressure overload.   2. Right ventricular systolic function is mildly reduced. The right  ventricular size is mildly enlarged. There is moderately elevated  pulmonary artery systolic pressure.   3. The mitral valve is normal in structure. Trivial mitral valve  regurgitation. No evidence of mitral stenosis.   4. The aortic valve is normal in structure. Aortic valve regurgitation is  not visualized. No aortic stenosis is present.   5. The inferior vena cava is  dilated in size with <50% respiratory  variability, suggesting right atrial pressure of 15 mmHg.   TTE 10/02/2018: 1. The left ventricle has normal systolic function with an ejection  fraction of 60-65%. The cavity size was normal. Left ventricular diastolic  Doppler parameters are consistent with impaired relaxation. No evidence of  left ventricular regional wall  motion abnormalities.   2. The right ventricle has normal systolic function. The cavity was  normal. There is no increase in right ventricular wall thickness.   3. No evidence of mitral valve stenosis. No significant mitral  regurgitation.   4. The aortic valve is tricuspid. Mild calcification of the aortic valve.  No stenosis of the aortic valve.   5. The aortic root is normal in size and structure.   6. The IVC is normal in size. No complete TR doppler jet so unable to  estimate PA systolic pressure.    Wynonia Musty [facility]/Anesthesiology Phone [phone] 02/03/2021 9:51 AM

## 2021-02-03 NOTE — Anesthesia Preprocedure Evaluation (Addendum)
Anesthesia Evaluation  Patient identified by MRN, date of birth, ID band Patient awake    Reviewed: Allergy & Precautions, H&P , NPO status , Patient's Chart, lab work & pertinent test results  Airway Mallampati: II   Neck ROM: full    Dental   Pulmonary shortness of breath, former smoker,    breath sounds clear to auscultation       Cardiovascular + dysrhythmias Atrial Fibrillation  Rhythm:regular Rate:Normal  S/p ablation for AF.   Neuro/Psych PSYCHIATRIC DISORDERS Depression    GI/Hepatic   Endo/Other    Renal/GU      Musculoskeletal  (+) Arthritis ,   Abdominal   Peds  Hematology   Anesthesia Other Findings   Reproductive/Obstetrics                            Anesthesia Physical Anesthesia Plan  ASA: 2  Anesthesia Plan: General   Post-op Pain Management:    Induction: Intravenous  PONV Risk Score and Plan: 2 and Ondansetron, Dexamethasone, Midazolam and Treatment may vary due to age or medical condition  Airway Management Planned: Oral ETT  Additional Equipment:   Intra-op Plan:   Post-operative Plan: Extubation in OR  Informed Consent: I have reviewed the patients History and Physical, chart, labs and discussed the procedure including the risks, benefits and alternatives for the proposed anesthesia with the patient or authorized representative who has indicated his/her understanding and acceptance.     Dental advisory given  Plan Discussed with: CRNA, Anesthesiologist and Surgeon  Anesthesia Plan Comments: (PAT note by Karoline Caldwell, PA-C: Follows with cardiology for history of A. fib and OSA on CPAP.  Patient underwent ablation 03/04/2020. Post ablation, he was noted to have throat irritation associated with anxiety and shortness of breath. He went into flash pulmonary edema and was found to be RSV positive. He was treated for viral pneumonia. He went into atrial  fibrillation while he was in the hospital and was started on amiodarone. Amiodarone has since been stopped.  Patient subsequently maintained sinus rhythm.  Last seen by Dr. Curt Bears 09/12/2020.  Per note, "Persistent atrial fibrillation: Currently on diltiazem, Eliquis. CHA2DS2-VASc of 1. Amiodarone was stopped at the last visit. Status post ablation 03/04/2020. He has had no further episodes of atrial fibrillation. He is also noted having difficulty getting his heart rate elevated when he exercises. We will thus plan to stop both metoprolol and Eliquis."  Preop labs reviewed, WNL.  EKG 09/12/2020: Sinus rhythm.  Rate 69.  TTE 03/05/2020: 1. Left ventricular ejection fraction, by estimation, is 50 to 55%. The  left ventricle has low normal function. The left ventricle has no regional  wall motion abnormalities. The left ventricular internal cavity size was  mildly dilated. There is mild  concentric left ventricular hypertrophy. Left ventricular diastolic  function could not be evaluated. There is the interventricular septum is  flattened in systole, consistent with right ventricular pressure overload.  2. Right ventricular systolic function is mildly reduced. The right  ventricular size is mildly enlarged. There is moderately elevated  pulmonary artery systolic pressure.  3. The mitral valve is normal in structure. Trivial mitral valve  regurgitation. No evidence of mitral stenosis.  4. The aortic valve is normal in structure. Aortic valve regurgitation is  not visualized. No aortic stenosis is present.  5. The inferior vena cava is dilated in size with <50% respiratory  variability, suggesting right atrial pressure of 15 mmHg.  TTE 10/02/2018: 1. The left ventricle has normal systolic function with an ejection  fraction of 60-65%. The cavity size was normal. Left ventricular diastolic  Doppler parameters are consistent with impaired relaxation. No evidence of  left ventricular  regional wall  motion abnormalities.  2. The right ventricle has normal systolic function. The cavity was  normal. There is no increase in right ventricular wall thickness.  3. No evidence of mitral valve stenosis. No significant mitral  regurgitation.  4. The aortic valve is tricuspid. Mild calcification of the aortic valve.  No stenosis of the aortic valve.  5. The aortic root is normal in size and structure.  6. The IVC is normal in size. No complete TR doppler jet so unable to  estimate PA systolic pressure.   )       Anesthesia Quick Evaluation

## 2021-02-06 ENCOUNTER — Encounter (HOSPITAL_COMMUNITY): Payer: Self-pay | Admitting: Neurosurgery

## 2021-02-06 ENCOUNTER — Ambulatory Visit (HOSPITAL_COMMUNITY): Payer: Medicare HMO

## 2021-02-06 ENCOUNTER — Ambulatory Visit (HOSPITAL_COMMUNITY): Payer: Medicare HMO | Admitting: Physician Assistant

## 2021-02-06 ENCOUNTER — Encounter (HOSPITAL_COMMUNITY)
Admission: RE | Disposition: A | Discharge status: Home / Self Care | Payer: Self-pay | Source: Home / Self Care | Attending: Neurosurgery

## 2021-02-06 ENCOUNTER — Ambulatory Visit (HOSPITAL_COMMUNITY): Payer: Medicare HMO | Admitting: Anesthesiology

## 2021-02-06 ENCOUNTER — Ambulatory Visit (HOSPITAL_COMMUNITY)
Admission: RE | Admit: 2021-02-06 | Discharge: 2021-02-07 | Disposition: A | Discharge status: Home / Self Care | Payer: Medicare HMO | Attending: Neurosurgery | Admitting: Neurosurgery

## 2021-02-06 ENCOUNTER — Other Ambulatory Visit: Payer: Self-pay

## 2021-02-06 DIAGNOSIS — M48062 Spinal stenosis, lumbar region with neurogenic claudication: Secondary | ICD-10-CM | POA: Diagnosis not present

## 2021-02-06 DIAGNOSIS — Z9889 Other specified postprocedural states: Secondary | ICD-10-CM | POA: Diagnosis not present

## 2021-02-06 DIAGNOSIS — Z79899 Other long term (current) drug therapy: Secondary | ICD-10-CM | POA: Diagnosis not present

## 2021-02-06 DIAGNOSIS — M5116 Intervertebral disc disorders with radiculopathy, lumbar region: Secondary | ICD-10-CM | POA: Diagnosis not present

## 2021-02-06 DIAGNOSIS — Z791 Long term (current) use of non-steroidal anti-inflammatories (NSAID): Secondary | ICD-10-CM | POA: Diagnosis not present

## 2021-02-06 DIAGNOSIS — Z87891 Personal history of nicotine dependence: Secondary | ICD-10-CM | POA: Insufficient documentation

## 2021-02-06 DIAGNOSIS — I4819 Other persistent atrial fibrillation: Secondary | ICD-10-CM | POA: Diagnosis not present

## 2021-02-06 DIAGNOSIS — Z880 Allergy status to penicillin: Secondary | ICD-10-CM | POA: Diagnosis not present

## 2021-02-06 DIAGNOSIS — I4891 Unspecified atrial fibrillation: Secondary | ICD-10-CM | POA: Insufficient documentation

## 2021-02-06 DIAGNOSIS — Z7951 Long term (current) use of inhaled steroids: Secondary | ICD-10-CM | POA: Diagnosis not present

## 2021-02-06 DIAGNOSIS — I480 Paroxysmal atrial fibrillation: Secondary | ICD-10-CM | POA: Diagnosis not present

## 2021-02-06 DIAGNOSIS — Z888 Allergy status to other drugs, medicaments and biological substances status: Secondary | ICD-10-CM | POA: Diagnosis not present

## 2021-02-06 DIAGNOSIS — Z419 Encounter for procedure for purposes other than remedying health state, unspecified: Secondary | ICD-10-CM

## 2021-02-06 HISTORY — PX: LUMBAR LAMINECTOMY/DECOMPRESSION MICRODISCECTOMY: SHX5026

## 2021-02-06 SURGERY — LUMBAR LAMINECTOMY/DECOMPRESSION MICRODISCECTOMY 2 LEVELS
Anesthesia: General

## 2021-02-06 MED ORDER — THROMBIN 5000 UNITS EX SOLR
OROMUCOSAL | Status: DC | PRN
  Administered 2021-02-06: 5 mL via TOPICAL

## 2021-02-06 MED ORDER — ONDANSETRON HCL 4 MG/2ML IJ SOLN: 4.0000 mg | Freq: Four times a day (QID) | INTRAMUSCULAR | Status: DC | PRN

## 2021-02-06 MED ORDER — ROCURONIUM BROMIDE 10 MG/ML (PF) SYRINGE
PREFILLED_SYRINGE | INTRAVENOUS | Status: DC | PRN
  Administered 2021-02-06: 30 mg via INTRAVENOUS
  Administered 2021-02-06: 70 mg via INTRAVENOUS

## 2021-02-06 MED ORDER — PROPOFOL 10 MG/ML IV BOLUS
INTRAVENOUS | Status: DC | PRN
  Administered 2021-02-06: 200 mg via INTRAVENOUS

## 2021-02-06 MED ORDER — 0.9 % SODIUM CHLORIDE (POUR BTL) OPTIME
TOPICAL | Status: DC | PRN
  Administered 2021-02-06: 1000 mL

## 2021-02-06 MED ORDER — SODIUM CHLORIDE 0.9 % IV SOLN
250.0000 mL | INTRAVENOUS | Status: DC
  Administered 2021-02-06: 250 mL via INTRAVENOUS

## 2021-02-06 MED ORDER — LACTATED RINGERS IV SOLN: INTRAVENOUS | Status: DC

## 2021-02-06 MED ORDER — ONDANSETRON HCL 4 MG/2ML IJ SOLN
INTRAMUSCULAR | Status: DC | PRN
  Administered 2021-02-06: 4 mg via INTRAVENOUS

## 2021-02-06 MED ORDER — ACETAMINOPHEN 500 MG PO TABS
1000.0000 mg | ORAL_TABLET | Freq: Four times a day (QID) | ORAL | Status: AC
  Administered 2021-02-06 – 2021-02-07 (×4): 1000 mg via ORAL
  Filled 2021-02-06 (×5): qty 2

## 2021-02-06 MED ORDER — ORAL CARE MOUTH RINSE: 15.0000 mL | Freq: Once | OROMUCOSAL | Status: AC

## 2021-02-06 MED ORDER — BACITRACIN ZINC 500 UNIT/GM EX OINT
TOPICAL_OINTMENT | CUTANEOUS | Status: AC
  Filled 2021-02-06: qty 28.35

## 2021-02-06 MED ORDER — DICLOFENAC SODIUM 75 MG PO TBEC
75.0000 mg | DELAYED_RELEASE_TABLET | Freq: Two times a day (BID) | ORAL | Status: DC
  Administered 2021-02-06 – 2021-02-07 (×2): 75 mg via ORAL
  Filled 2021-02-06 (×3): qty 1

## 2021-02-06 MED ORDER — MIDAZOLAM HCL 2 MG/2ML IJ SOLN
INTRAMUSCULAR | Status: DC | PRN
  Administered 2021-02-06: 2 mg via INTRAVENOUS

## 2021-02-06 MED ORDER — CHLORHEXIDINE GLUCONATE 0.12 % MT SOLN
15.0000 mL | Freq: Once | OROMUCOSAL | Status: AC
  Administered 2021-02-06: 15 mL via OROMUCOSAL
  Filled 2021-02-06: qty 15

## 2021-02-06 MED ORDER — ACETAMINOPHEN 650 MG RE SUPP: 650.0000 mg | RECTAL | Status: DC | PRN

## 2021-02-06 MED ORDER — THROMBIN 5000 UNITS EX SOLR
CUTANEOUS | Status: AC
  Filled 2021-02-06: qty 5000

## 2021-02-06 MED ORDER — ROCURONIUM BROMIDE 10 MG/ML (PF) SYRINGE
PREFILLED_SYRINGE | INTRAVENOUS | Status: AC
  Filled 2021-02-06: qty 10

## 2021-02-06 MED ORDER — OXYCODONE HCL 5 MG PO TABS: 5.0000 mg | ORAL_TABLET | Freq: Once | ORAL | Status: DC | PRN

## 2021-02-06 MED ORDER — OXYCODONE HCL 5 MG/5ML PO SOLN: 5.0000 mg | Freq: Once | ORAL | Status: DC | PRN

## 2021-02-06 MED ORDER — DOCUSATE SODIUM 100 MG PO CAPS
100.0000 mg | ORAL_CAPSULE | Freq: Two times a day (BID) | ORAL | Status: DC
  Administered 2021-02-06 – 2021-02-07 (×3): 100 mg via ORAL
  Filled 2021-02-06 (×3): qty 1

## 2021-02-06 MED ORDER — VANCOMYCIN HCL IN DEXTROSE 1-5 GM/200ML-% IV SOLN
1000.0000 mg | Freq: Once | INTRAVENOUS | Status: AC
  Administered 2021-02-06: 1000 mg via INTRAVENOUS
  Filled 2021-02-06: qty 200

## 2021-02-06 MED ORDER — MENTHOL 3 MG MT LOZG: 1.0000 | LOZENGE | OROMUCOSAL | Status: DC | PRN

## 2021-02-06 MED ORDER — OXYCODONE HCL 5 MG PO TABS
5.0000 mg | ORAL_TABLET | ORAL | Status: DC | PRN
  Administered 2021-02-06 – 2021-02-07 (×3): 5 mg via ORAL
  Filled 2021-02-06 (×2): qty 1

## 2021-02-06 MED ORDER — CYCLOBENZAPRINE HCL 10 MG PO TABS
10.0000 mg | ORAL_TABLET | Freq: Three times a day (TID) | ORAL | Status: DC | PRN
  Administered 2021-02-06: 10 mg via ORAL
  Filled 2021-02-06: qty 1

## 2021-02-06 MED ORDER — FENTANYL CITRATE (PF) 250 MCG/5ML IJ SOLN
INTRAMUSCULAR | Status: DC | PRN
  Administered 2021-02-06 (×4): 50 ug via INTRAVENOUS

## 2021-02-06 MED ORDER — PROPOFOL 10 MG/ML IV BOLUS
INTRAVENOUS | Status: AC
  Filled 2021-02-06: qty 20

## 2021-02-06 MED ORDER — PHENOL 1.4 % MT LIQD: 1.0000 | OROMUCOSAL | Status: DC | PRN

## 2021-02-06 MED ORDER — BACITRACIN ZINC 500 UNIT/GM EX OINT
TOPICAL_OINTMENT | CUTANEOUS | Status: DC | PRN
  Administered 2021-02-06: 1 via TOPICAL

## 2021-02-06 MED ORDER — CHLORHEXIDINE GLUCONATE CLOTH 2 % EX PADS: 6.0000 | MEDICATED_PAD | Freq: Once | CUTANEOUS | Status: DC

## 2021-02-06 MED ORDER — FLUTICASONE PROPIONATE 50 MCG/ACT NA SUSP
1.0000 | Freq: Every day | NASAL | Status: DC
  Filled 2021-02-06: qty 16

## 2021-02-06 MED ORDER — OXYCODONE HCL 5 MG PO TABS
10.0000 mg | ORAL_TABLET | ORAL | Status: DC | PRN
Start: 2021-02-06 — End: 2021-02-07
  Administered 2021-02-06 – 2021-02-07 (×3): 10 mg via ORAL
  Filled 2021-02-06 (×4): qty 2

## 2021-02-06 MED ORDER — MIDAZOLAM HCL 2 MG/2ML IJ SOLN
INTRAMUSCULAR | Status: AC
  Filled 2021-02-06: qty 2

## 2021-02-06 MED ORDER — THROMBIN 20000 UNITS EX SOLR
CUTANEOUS | Status: AC
  Filled 2021-02-06: qty 20000

## 2021-02-06 MED ORDER — ONDANSETRON HCL 4 MG PO TABS: 4.0000 mg | ORAL_TABLET | Freq: Four times a day (QID) | ORAL | Status: DC | PRN

## 2021-02-06 MED ORDER — LIDOCAINE 2% (20 MG/ML) 5 ML SYRINGE
INTRAMUSCULAR | Status: DC | PRN
  Administered 2021-02-06: 60 mg via INTRAVENOUS

## 2021-02-06 MED ORDER — FENTANYL CITRATE (PF) 100 MCG/2ML IJ SOLN: 25.0000 ug | INTRAMUSCULAR | Status: DC | PRN

## 2021-02-06 MED ORDER — BUPIVACAINE-EPINEPHRINE 0.5% -1:200000 IJ SOLN
INTRAMUSCULAR | Status: AC
  Filled 2021-02-06: qty 1

## 2021-02-06 MED ORDER — BISACODYL 10 MG RE SUPP: 10.0000 mg | Freq: Every day | RECTAL | Status: DC | PRN

## 2021-02-06 MED ORDER — LIDOCAINE 2% (20 MG/ML) 5 ML SYRINGE
INTRAMUSCULAR | Status: AC
  Filled 2021-02-06: qty 5

## 2021-02-06 MED ORDER — FENTANYL CITRATE (PF) 250 MCG/5ML IJ SOLN
INTRAMUSCULAR | Status: AC
  Filled 2021-02-06: qty 5

## 2021-02-06 MED ORDER — CHLORHEXIDINE GLUCONATE CLOTH 2 % EX PADS
6.0000 | MEDICATED_PAD | Freq: Once | CUTANEOUS | Status: DC
Start: 2021-02-06 — End: 2021-02-06

## 2021-02-06 MED ORDER — MORPHINE SULFATE (PF) 4 MG/ML IV SOLN
4.0000 mg | INTRAVENOUS | Status: DC | PRN
Start: 2021-02-06 — End: 2021-02-07

## 2021-02-06 MED ORDER — DEXAMETHASONE SODIUM PHOSPHATE 10 MG/ML IJ SOLN
INTRAMUSCULAR | Status: DC | PRN
  Administered 2021-02-06: 10 mg via INTRAVENOUS

## 2021-02-06 MED ORDER — DILTIAZEM HCL ER COATED BEADS 120 MG PO CP24
360.0000 mg | ORAL_CAPSULE | Freq: Every day | ORAL | Status: DC
  Administered 2021-02-07: 360 mg via ORAL
  Filled 2021-02-06: qty 3

## 2021-02-06 MED ORDER — ACETAMINOPHEN 325 MG PO TABS: 650.0000 mg | ORAL_TABLET | ORAL | Status: DC | PRN

## 2021-02-06 MED ORDER — SUGAMMADEX SODIUM 200 MG/2ML IV SOLN
INTRAVENOUS | Status: DC | PRN
  Administered 2021-02-06: 200 mg via INTRAVENOUS

## 2021-02-06 MED ORDER — SODIUM CHLORIDE 0.9% FLUSH: 3.0000 mL | INTRAVENOUS | Status: DC | PRN

## 2021-02-06 MED ORDER — THROMBIN 20000 UNITS EX KIT
PACK | CUTANEOUS | Status: DC | PRN
  Administered 2021-02-06: 20 mL via TOPICAL

## 2021-02-06 MED ORDER — LORATADINE 10 MG PO TABS
10.0000 mg | ORAL_TABLET | Freq: Every day | ORAL | Status: DC
  Administered 2021-02-07: 10 mg via ORAL
  Filled 2021-02-06: qty 1

## 2021-02-06 MED ORDER — ONDANSETRON HCL 4 MG/2ML IJ SOLN
INTRAMUSCULAR | Status: AC
  Filled 2021-02-06: qty 2

## 2021-02-06 MED ORDER — BUPIVACAINE-EPINEPHRINE (PF) 0.5% -1:200000 IJ SOLN
INTRAMUSCULAR | Status: DC | PRN
  Administered 2021-02-06: 10 mL

## 2021-02-06 MED ORDER — DEXAMETHASONE SODIUM PHOSPHATE 10 MG/ML IJ SOLN
INTRAMUSCULAR | Status: AC
  Filled 2021-02-06: qty 1

## 2021-02-06 MED ORDER — SODIUM CHLORIDE 0.9% FLUSH
3.0000 mL | Freq: Two times a day (BID) | INTRAVENOUS | Status: DC
  Administered 2021-02-06: 3 mL via INTRAVENOUS

## 2021-02-06 MED ORDER — VANCOMYCIN HCL 1500 MG/300ML IV SOLN
1500.0000 mg | INTRAVENOUS | Status: AC
  Administered 2021-02-06: 1500 mg via INTRAVENOUS
  Filled 2021-02-06 (×2): qty 300

## 2021-02-06 SURGICAL SUPPLY — 47 items
BAG COUNTER SPONGE SURGICOUNT (BAG) ×4 IMPLANT
BAND RUBBER #18 3X1/16 STRL (MISCELLANEOUS) ×4 IMPLANT
BENZOIN TINCTURE PRP APPL 2/3 (GAUZE/BANDAGES/DRESSINGS) ×2 IMPLANT
BLADE CLIPPER SURG (BLADE) IMPLANT
BUR MATCHSTICK NEURO 3.0 LAGG (BURR) ×2 IMPLANT
BUR PRECISION FLUTE 6.0 (BURR) ×2 IMPLANT
CANISTER SUCT 3000ML PPV (MISCELLANEOUS) ×2 IMPLANT
CARTRIDGE OIL MAESTRO DRILL (MISCELLANEOUS) ×1 IMPLANT
DIFFUSER DRILL AIR PNEUMATIC (MISCELLANEOUS) ×2 IMPLANT
DRAPE LAPAROTOMY 100X72X124 (DRAPES) ×2 IMPLANT
DRAPE MICROSCOPE LEICA (MISCELLANEOUS) ×2 IMPLANT
DRAPE SURG 17X23 STRL (DRAPES) ×8 IMPLANT
DRSG OPSITE POSTOP 4X6 (GAUZE/BANDAGES/DRESSINGS) ×2 IMPLANT
ELECT BLADE 4.0 EZ CLEAN MEGAD (MISCELLANEOUS) ×2
ELECT REM PT RETURN 9FT ADLT (ELECTROSURGICAL) ×2
ELECTRODE BLDE 4.0 EZ CLN MEGD (MISCELLANEOUS) ×1 IMPLANT
ELECTRODE REM PT RTRN 9FT ADLT (ELECTROSURGICAL) ×1 IMPLANT
GAUZE 4X4 16PLY ~~LOC~~+RFID DBL (SPONGE) ×2 IMPLANT
GAUZE SPONGE 4X4 12PLY STRL (GAUZE/BANDAGES/DRESSINGS) ×2 IMPLANT
GLOVE EXAM NITRILE XL STR (GLOVE) IMPLANT
GLOVE SURG ENC MOIS LTX SZ8 (GLOVE) ×2 IMPLANT
GLOVE SURG ENC MOIS LTX SZ8.5 (GLOVE) ×2 IMPLANT
GOWN STRL REUS W/ TWL LRG LVL3 (GOWN DISPOSABLE) IMPLANT
GOWN STRL REUS W/ TWL XL LVL3 (GOWN DISPOSABLE) ×1 IMPLANT
GOWN STRL REUS W/TWL 2XL LVL3 (GOWN DISPOSABLE) IMPLANT
GOWN STRL REUS W/TWL LRG LVL3 (GOWN DISPOSABLE)
GOWN STRL REUS W/TWL XL LVL3 (GOWN DISPOSABLE) ×1
HEMOSTAT POWDER KIT SURGIFOAM (HEMOSTASIS) ×2 IMPLANT
KIT BASIN OR (CUSTOM PROCEDURE TRAY) ×2 IMPLANT
KIT TURNOVER KIT B (KITS) ×2 IMPLANT
NEEDLE HYPO 21X1.5 SAFETY (NEEDLE) IMPLANT
NEEDLE HYPO 22GX1.5 SAFETY (NEEDLE) ×2 IMPLANT
NS IRRIG 1000ML POUR BTL (IV SOLUTION) ×2 IMPLANT
OIL CARTRIDGE MAESTRO DRILL (MISCELLANEOUS) ×2
PACK LAMINECTOMY NEURO (CUSTOM PROCEDURE TRAY) ×2 IMPLANT
PAD ARMBOARD 7.5X6 YLW CONV (MISCELLANEOUS) ×6 IMPLANT
PATTIES SURGICAL .5 X1 (DISPOSABLE) IMPLANT
SPONGE SURGIFOAM ABS GEL 100 (HEMOSTASIS) ×2 IMPLANT
SPONGE SURGIFOAM ABS GEL SZ50 (HEMOSTASIS) IMPLANT
SPONGE T-LAP 4X18 ~~LOC~~+RFID (SPONGE) ×2 IMPLANT
STRIP CLOSURE SKIN 1/2X4 (GAUZE/BANDAGES/DRESSINGS) ×2 IMPLANT
SUT VIC AB 1 CT1 18XBRD ANBCTR (SUTURE) ×2 IMPLANT
SUT VIC AB 1 CT1 8-18 (SUTURE) ×2
SUT VIC AB 2-0 CP2 18 (SUTURE) ×4 IMPLANT
TOWEL GREEN STERILE (TOWEL DISPOSABLE) ×2 IMPLANT
TOWEL GREEN STERILE FF (TOWEL DISPOSABLE) ×2 IMPLANT
WATER STERILE IRR 1000ML POUR (IV SOLUTION) ×2 IMPLANT

## 2021-02-06 NOTE — Op Note (Signed)
Brief history: The patient is a 65 year old white male who has had previous lumbar laminectomy.  He has developed recurrent back and left great and right buttock/leg pain.  He has failed medical management and was worked up with a lumbar MRI which demonstrated spinal stenosis at L2-3 and L3-4 and a bulging/herniated disc at L2-3.  I discussed the various treatment options with him.  He has decided proceed with surgery.  Preoperative diagnosis: L2-3 and L3-4 spinal stenosis, lumbar bulging/herniated disc, lumbar radiculopathy, lumbago, neurogenic claudication  Postoperative diagnosis: The same  Procedure: Bilateral L2-3 and redo L3-4 laminotomy/foraminotomy  using micro-dissection  Surgeon: Dr. Earle Gell  Asst.: Dr. Deatra Ina and Arnetha Massy, NP  Anesthesia: Gen. endotracheal  Estimated blood loss: 75 cc  Drains: None  Complications: None  Description of procedure: The patient was brought to the operating room by the anesthesia team. General endotracheal anesthesia was induced. The patient was turned to the prone position on the Wilson frame. The patient's lumbosacral region was then prepared with Betadine scrub and Betadine solution. Sterile drapes were applied.  I then injected the area to be incised with Marcaine with epinephrine solution. I then used a scalpel to make a linear midline incision over the L2-3 and L3-4 intervertebral disc space. I then used electrocautery to perform a left sided subperiosteal dissection exposing the spinous process and lamina of L2-3 and the previous laminectomy at L3-4. We obtained intraoperative radiograph to confirm our location. I then inserted the [facility] retractor for exposure.  We then brought the operative microscope into the field. Under its magnification and illumination we completed the microdissection. I used a high-speed drill to perform a laminotomy at L2-3 on the left and widen the previous L3-4 laminectomy. I then used a  Kerrison punches to widen the laminotomy and removed the ligamentum flavum at L2-3 and the epidural scar tissue at L3-4. We then used microdissection to free up the thecal sac and the left L3 and L4 nerve root from the epidural tissue. I then used a Kerrison punch to perform a foraminotomy at about the left L3 and L4 nerve root.  I used I the high-speed drill to drill across the midline at L2-3 and widen the right L3-4 hemilaminectomy.  I used a Kerrison punch to remove the right L L2-3 ligamentum flavum and the epidural scar tissue at L3-4 on the right.  We then performed foraminotomies about the right L3 and L4 nerve root.  We then using the nerve root retractor to gently retract the thecal sac and the L3 nerve root medially. This exposed the intervertebral disc.  The disc was bulging but not herniated.  We shrunk the annulus with bipolar electrocautery.  It did not perform an epidural discectomy.  I then palpated along the ventral surface of the thecal sac and along exit route of the bilateral L3 and L4 nerve root and noted that the neural structures were well decompressed. This completed the decompression.  We then obtained hemostasis using bipolar electrocautery. We irrigated the wound out with bacitracin solution. We then removed the retractor. We then reapproximated the patient's thoracolumbar fascia with interrupted #1 Vicryl suture. We then reapproximated the patient's subcutaneous tissue with interrupted 2-0 Vicryl suture. We then reapproximated patient's skin with Steri-Strips and benzoin. The was then coated with bacitracin ointment. The drapes were removed. The patient was subsequently returned to the supine position where they were extubated by the anesthesia team. The patient was then transported to the postanesthesia care unit in stable  condition. All sponge instrument and needle counts were reportedly correct at the end of this case.

## 2021-02-06 NOTE — Transfer of Care (Signed)
Immediate Anesthesia Transfer of Care Note  Patient: Michael Garner  Procedure(s) Performed: LAMINECTOMY, FORAMINOTOMY LUMBAR TWO-THREE, LUMBAR THREE-FOUR; LEFT LUBMAR TWO-THREE DISCECTOMY  Patient Location: PACU  Anesthesia Type:General  Level of Consciousness: drowsy and patient cooperative  Airway & Oxygen Therapy: Patient Spontanous Breathing  Post-op Assessment: Report given to RN and Post -op Vital signs reviewed and stable  Post vital signs: Reviewed and stable  Last Vitals:  Vitals Value Taken Time  BP    Temp    Pulse 71 02/06/21 0948  Resp 16 02/06/21 0948  SpO2 92 % 02/06/21 0948  Vitals shown include unvalidated device data.  Last Pain:  Vitals:   02/06/21 0634  TempSrc:   PainSc: 7       Patients Stated Pain Goal: 0 (96/75/91 [id number])  Complications: No notable events documented.

## 2021-02-06 NOTE — H&P (Signed)
Subjective: The patient is a 65 year old white male who has had previous lumbar surgery.  He complains of back and left great than right leg pain.  He has failed medical management.  He was worked up with a lumbar MRI which demonstrated spinal stenosis at L2-3 and L3-4 with a left herniated disc at L3-4.  I discussed the various treatment options with him.  He has decided proceed with surgery.  Past Medical History:  Diagnosis Date   A-fib [facility])    Arthritis 2020   in hands   BPH with urinary obstruction    Erectile dysfunction    Hepatitis B infection    resolved    Pneumonia 2021   hospitalized   Pulmonary edema 03/05/2020    Past Surgical History:  Procedure Laterality Date   ATRIAL FIBRILLATION ABLATION N/A 03/04/2020   Procedure: ATRIAL FIBRILLATION ABLATION;  Surgeon: [name], MD;  Location: [place] [facility];  Service: Cardiovascular;  Laterality: N/A;   CARDIOVERSION  05/16/2018   COLONOSCOPY  07/18/2011   per Dr. Sharlett Iles, diverticulosis only, repeat in 10 yrs    LUMBAR LAMINECTOMY/DECOMPRESSION MICRODISCECTOMY  2018   LUMBAR MICRODISCECTOMY  2002    Allergies  Allergen Reactions   Dextromethorphan Other (See Comments)    Per patient "feels like I'm having a heart attack."  Tolerates plain guaifenesin.   Penicillins     Not sure but immediate family members are allergic   Prednisone Palpitations    Patient "put me in afib"    Social History   Tobacco Use   Smoking status: Former    Types: Cigarettes    Quit date: 2002    Years since quitting: 20.8   Smokeless tobacco: Never  Substance Use Topics   Alcohol use: Yes    Alcohol/week: 10.0 standard drinks    Types: 10 Cans of beer per week    Comment: "couple of beers after work every night"    Family History  Problem Relation Age of Onset   Alcohol abuse Father    Lung cancer Father    Prior to Admission medications   Medication Sig Start Date End Date Taking? Authorizing Provider   cetirizine (ZYRTEC) 10 MG tablet Take 10 mg by mouth daily.   Yes [provider]  diclofenac (VOLTAREN) 75 MG EC tablet Take 1 tablet (75 mg total) by mouth 2 (two) times daily. 12/08/20  Yes Laurey Morale, MD  diltiazem (CARDIZEM CD) 360 MG 24 hr capsule Take 1 capsule (360 mg total) by mouth daily. 01/05/21  Yes Camnitz, Ocie Doyne, MD  ibuprofen (ADVIL) 200 MG tablet Take 600 mg by mouth every 8 (eight) hours as needed for mild pain or moderate pain (Lower pain).   Yes [provider]  mometasone (NASONEX) 50 MCG/ACT nasal spray Place 1 spray into the nose daily.   Yes [provider]  Multiple Vitamins-Minerals (MULTIVITAMIN WITH MINERALS) tablet Take 1 tablet by mouth daily.   Yes [provider]  OVER THE COUNTER MEDICATION Take 2 capsules by mouth in the morning and at bedtime. OMEGA XL   Yes [provider]  OVER THE COUNTER MEDICATION Take 3 capsules by mouth daily. Prostagenics   Yes [provider]  tadalafil (CIALIS) 20 MG tablet Take 1 tablet (20 mg total) by mouth as needed for erectile dysfunction. 02/22/15 05/16/26  Laurey Morale, MD  terbinafine (LAMISIL) 250 MG tablet Take 1 tablet (250 mg total) by mouth daily. Patient not taking: No sig  reported 09/14/20   Edrick Kins, DPM     Review of Systems  Positive ROS: As above  All other systems have been reviewed and were otherwise negative with the exception of those mentioned in the HPI and as above.  Objective: Vital signs in last 24 hours: Temp:  [98 F (36.7 C)] 98 F (36.7 C) (10/31 0554) Pulse Rate:  [81] 81 (10/31 0554) Resp:  [18] 18 (10/31 0554) BP: (139)/(84) 139/84 (10/31 0554) SpO2:  [93 %] 93 % (10/31 0554) Weight:  [113.4 kg] 113.4 kg (10/31 0554) Estimated body mass index is 34.87 kg/m as calculated from the following:   Height as of this encounter: 5\' 11"  (1.803 m).   Weight as of this encounter: 113.4 kg.   General Appearance: Alert Head:  Normocephalic, without obvious abnormality, atraumatic Eyes: PERRL, conjunctiva/corneas clear, EOM's intact,    Ears: Normal  Throat: Normal  Neck: Supple, Back: The patient's lumbar incision is well-healed. Lungs: Clear to auscultation bilaterally, respirations unlabored Heart: Regular rate and rhythm, no murmur, rub or gallop Abdomen: Soft, non-tender Extremities: Extremities normal, atraumatic, no cyanosis or edema Skin: unremarkable  NEUROLOGIC:   Mental status: alert and oriented,Motor Exam - grossly normal Sensory Exam - grossly normal Reflexes:  Coordination - grossly normal Gait - grossly normal Balance - grossly normal Cranial Nerves: I: smell Not tested  II: visual acuity  OS: Normal  OD: Normal   II: visual fields Full to confrontation  II: pupils Equal, round, reactive to light  III,VII: ptosis None  III,IV,VI: extraocular muscles  Full ROM  V: mastication Normal  V: facial light touch sensation  Normal  V,VII: corneal reflex  Present  VII: facial muscle function - upper  Normal  VII: facial muscle function - lower Normal  VIII: hearing Not tested  IX: soft palate elevation  Normal  IX,X: gag reflex Present  XI: trapezius strength  5/5  XI: sternocleidomastoid strength 5/5  XI: neck flexion strength  5/5  XII: tongue strength  Normal    Data Review Lab Results  Component Value Date   WBC 5.6 02/02/2021   HGB 15.8 02/02/2021   HCT 45.4 02/02/2021   MCV 90.8 02/02/2021   PLT 205 02/02/2021   Lab Results  Component Value Date   NA 135 02/02/2021   K 4.2 02/02/2021   CL 103 02/02/2021   CO2 25 02/02/2021   BUN 20 02/02/2021   CREATININE 0.86 02/02/2021   GLUCOSE 90 02/02/2021   Lab Results  Component Value Date   INR 1.4 (H) 03/05/2020    Assessment/Plan: Lumbar spinal stenosis, lumbar disc, lumbar radiculopathy, lumbago, neurogenic claudication: I have discussed situation with the patient.  I reviewed his image studies with him and pointed  out the abnormalities.  We have discussed the various treatment options including surgery.  I have described surgical treatment option of L2-3 and L3-4 laminotomy/foraminotomies with a left L3-4 discectomy.  I have shown him surgical models.  I have given him a surgical pamphlet.  We have discussed the risk, benefits, alternatives, expected postop course, and likelihood of achieving our goals with surgery.  I have answered all his questions.  He has decided proceed with surgery.   Ophelia Charter 02/06/2021 7:21 AM

## 2021-02-06 NOTE — Anesthesia Procedure Notes (Signed)
Procedure Name: Intubation Date/Time: 02/06/2021 7:43 AM Performed by: Lance Coon, CRNA Pre-anesthesia Checklist: Patient identified, Emergency Drugs available, Suction available, Patient being monitored and Timeout performed Patient Re-evaluated:Patient Re-evaluated prior to induction Oxygen Delivery Method: Circle system utilized Preoxygenation: Pre-oxygenation with 100% oxygen Induction Type: IV induction Ventilation: Mask ventilation without difficulty and Oral airway inserted - appropriate to patient size Laryngoscope Size: Miller and 3 Grade View: Grade II Tube type: Oral Tube size: 7.5 mm Number of attempts: 1 Airway Equipment and Method: Stylet Placement Confirmation: ETT inserted through vocal cords under direct vision, positive ETCO2 and breath sounds checked- equal and bilateral Secured at: 22 cm Tube secured with: Tape Dental Injury: Teeth and Oropharynx as per pre-operative assessment

## 2021-02-07 ENCOUNTER — Encounter (HOSPITAL_COMMUNITY): Payer: Self-pay | Admitting: Neurosurgery

## 2021-02-07 DIAGNOSIS — Z87891 Personal history of nicotine dependence: Secondary | ICD-10-CM | POA: Diagnosis not present

## 2021-02-07 DIAGNOSIS — Z7951 Long term (current) use of inhaled steroids: Secondary | ICD-10-CM | POA: Diagnosis not present

## 2021-02-07 DIAGNOSIS — Z79899 Other long term (current) drug therapy: Secondary | ICD-10-CM | POA: Diagnosis not present

## 2021-02-07 DIAGNOSIS — Z880 Allergy status to penicillin: Secondary | ICD-10-CM | POA: Diagnosis not present

## 2021-02-07 DIAGNOSIS — M5116 Intervertebral disc disorders with radiculopathy, lumbar region: Secondary | ICD-10-CM | POA: Diagnosis not present

## 2021-02-07 DIAGNOSIS — M48062 Spinal stenosis, lumbar region with neurogenic claudication: Secondary | ICD-10-CM | POA: Diagnosis not present

## 2021-02-07 DIAGNOSIS — I4891 Unspecified atrial fibrillation: Secondary | ICD-10-CM | POA: Diagnosis not present

## 2021-02-07 DIAGNOSIS — Z791 Long term (current) use of non-steroidal anti-inflammatories (NSAID): Secondary | ICD-10-CM | POA: Diagnosis not present

## 2021-02-07 DIAGNOSIS — Z888 Allergy status to other drugs, medicaments and biological substances status: Secondary | ICD-10-CM | POA: Diagnosis not present

## 2021-02-07 LAB — BASIC METABOLIC PANEL
Anion gap: 7 (ref 5–15)
BUN: 14 mg/dL (ref 8–23)
CO2: 25 mmol/L (ref 22–32)
Calcium: 8.9 mg/dL (ref 8.9–10.3)
Chloride: 103 mmol/L (ref 98–111)
Creatinine, Ser: 0.82 mg/dL (ref 0.61–1.24)
GFR, Estimated: >60 mL/min (ref >60)
Glucose, Bld: 125 mg/dL — ABNORMAL HIGH (ref 70–99)
Potassium: 4.4 mmol/L (ref 3.5–5.1)
Sodium: 135 mmol/L (ref 135–145)

## 2021-02-07 MED ORDER — OXYCODONE HCL 10 MG PO TABS: 10.0000 mg | ORAL_TABLET | ORAL | 0 refills | Status: DC | PRN

## 2021-02-07 MED ORDER — CYCLOBENZAPRINE HCL 10 MG PO TABS: 10.0000 mg | ORAL_TABLET | Freq: Three times a day (TID) | ORAL | 0 refills | Status: DC | PRN

## 2021-02-07 MED ORDER — DOCUSATE SODIUM 100 MG PO CAPS: 100.0000 mg | ORAL_CAPSULE | Freq: Two times a day (BID) | ORAL | 0 refills | Status: DC

## 2021-02-07 NOTE — Anesthesia Postprocedure Evaluation (Signed)
Anesthesia Post Note  Patient: Kwasi Joung  Procedure(s) Performed: LAMINECTOMY, FORAMINOTOMY LUMBAR TWO-THREE, LUMBAR THREE-FOUR; LEFT LUBMAR TWO-THREE DISCECTOMY     Patient location during evaluation: PACU Anesthesia Type: General Level of consciousness: awake and alert Pain management: pain level controlled Vital Signs Assessment: post-procedure vital signs reviewed and stable Respiratory status: spontaneous breathing, nonlabored ventilation, respiratory function stable and patient connected to nasal cannula oxygen Cardiovascular status: blood pressure returned to baseline and stable Postop Assessment: no apparent nausea or vomiting Anesthetic complications: no   No notable events documented.  Last Vitals:  Vitals:   02/07/21 0416 02/07/21 0729  BP: 122/75 113/66  Pulse: 68 68  Resp: 20 18  Temp: 36.7 C 36.7 C  SpO2: 97% 97%    Last Pain:  Vitals:   02/07/21 0729  TempSrc: Oral  PainSc:                  [place] S

## 2021-02-07 NOTE — Plan of Care (Signed)
Patient alert and oriented, mae's well, voiding adequate amount of urine, swallowing without difficulty, no c/o pain at time of discharge. Patient discharged home with family. Script and discharged instructions given to patient. Patient and family stated understanding of instructions given. Patient has an appointment with Dr. Jenkins in 3 weeks 

## 2021-02-07 NOTE — Evaluation (Signed)
Physical Therapy Evaluation and Discharge Patient Details Name: Michael Garner MRN: [id number] DOB: Jun 19, 1955 Today's Date: 02/07/2021  History of Present Illness  Pt is a 65 y/o male who presents s/p L2-3 and redo L3-4 laminotomy/foraminotomy using microdissection on 02/06/2021. PMH significant for a-fib s/p ablation, BPH with urinary obstruction, Hep B, pulmonary edema, laminectomy/decompression 2002 and 2018.   Clinical Impression  Patient evaluated by Physical Therapy with no further acute PT needs identified. All education has been completed and the patient has no further questions. Pt was able to demonstrate transfers and ambulation with gross modified independence and no AD. Pt was educated on precautions,positioning recommendations, appropriate activity progression, and car transfer. See below for any follow-up Physical Therapy or equipment needs. PT is signing off. Thank you for this referral.        Recommendations for follow up therapy are one component of a multi-disciplinary discharge planning process, led by the attending physician.  Recommendations may be updated based on patient status, additional functional criteria and insurance authorization.  Follow Up Recommendations No PT follow up    Assistance Recommended at Discharge PRN  Functional Status Assessment Patient has had a recent decline in their functional status and demonstrates the ability to make significant improvements in function in a reasonable and predictable amount of time.  Equipment Recommendations  None recommended by PT    Recommendations for Other Services       Precautions / Restrictions Precautions Precautions: Fall;Back Precaution Booklet Issued: Yes (comment) Precaution Comments: Reviewed handout and pt was cued for precautions during functional mobility. Required Braces or Orthoses:  (No brace needed order) Restrictions Weight Bearing Restrictions: No      Mobility  Bed Mobility Overal bed  mobility: Modified Independent             General bed mobility comments: HOB flat and rails lowered to simulate home environment.    Transfers Overall transfer level: Modified independent Equipment used: None               General transfer comment: No assist required. Pt demonstrated proper hand placement on seated surface for safety.    Ambulation/Gait Ambulation/Gait assistance: Modified independent (Device/Increase time) Gait Distance (Feet): 560 Feet Assistive device: None Gait Pattern/deviations: Step-through pattern;Decreased stride length;Trunk flexed Gait velocity: Decreased Gait velocity interpretation: >2.62 ft/sec, indicative of community ambulatory General Gait Details: VC's for improved posture. Overall steady without overt LOB.  Stairs Stairs: Yes Stairs assistance: Modified independent (Device/Increase time) Stair Management: One rail Right;Alternating pattern;Forwards Number of Stairs: 10 General stair comments: Light cues for safety - no assist required.  Wheelchair Mobility    Modified Rankin (Stroke Patients Only)       Balance Overall balance assessment: No apparent balance deficits (not formally assessed)                                           Pertinent Vitals/Pain Pain Assessment: Faces Faces Pain Scale: Hurts a little bit Pain Location: Incision site Pain Descriptors / Indicators: Operative site guarding;Sore Pain Intervention(s): Limited activity within patient's tolerance;Monitored during session;Repositioned    Home Living Family/patient expects to be discharged to:: Private residence Living Arrangements: Alone Available Help at Discharge: Family;Available PRN/intermittently (Daughter after school and mother across the street) Type of Home: House Home Access: Stairs to enter Entrance Stairs-Rails: Right Entrance Stairs-Number of Steps: 3   Home Layout: One level  Prior Function Prior Level of  Function : Independent/Modified Independent             Mobility Comments: Pt was exercising at the gym 2-3x/week prior to ~3 months ago. Had to stop due to pain. ADLs Comments: Independent     Hand Dominance        Extremity/Trunk Assessment   Upper Extremity Assessment Upper Extremity Assessment: Overall WFL for tasks assessed    Lower Extremity Assessment Lower Extremity Assessment: LLE deficits/detail LLE Deficits / Details: Pt reports baseline nerve damage in L foot from prior back surgery.    Cervical / Trunk Assessment Cervical / Trunk Assessment: Back Surgery  Communication   Communication: No difficulties  Cognition Arousal/Alertness: Awake/alert Behavior During Therapy: WFL for tasks assessed/performed Overall Cognitive Status: Within Functional Limits for tasks assessed                                          General Comments      Exercises     Assessment/Plan    PT Assessment Patient does not need any further PT services  PT Problem List         PT Treatment Interventions      PT Goals (Current goals can be found in the Care Plan section)  Acute Rehab PT Goals Patient Stated Goal: Home today PT Goal Formulation: All assessment and education complete, DC therapy    Frequency     Barriers to discharge        Co-evaluation               AM-PAC PT "6 Clicks" Mobility  Outcome Measure Help needed turning from your back to your side while in a flat bed without using bedrails?: None Help needed moving from lying on your back to sitting on the side of a flat bed without using bedrails?: None Help needed moving to and from a bed to a chair (including a wheelchair)?: None Help needed standing up from a chair using your arms (e.g., wheelchair or bedside chair)?: None Help needed to walk in hospital room?: None Help needed climbing 3-5 steps with a railing? : None 6 Click Score: 24    End of Session Equipment Utilized  During Treatment: Gait belt Activity Tolerance: Patient tolerated treatment well Patient left: in bed;with call bell/phone within reach Nurse Communication: Mobility status PT Visit Diagnosis: Unsteadiness on feet (R26.81);Pain Pain - part of body:  (back)    Time: 9937-1696 PT Time Calculation (min) (ACUTE ONLY): 23 min   Charges:   PT Evaluation $PT Eval Low Complexity: 1 Low PT Treatments $Gait Training: 8-22 mins        Rolinda Roan, PT, DPT Acute Rehabilitation Services Pager: [phone] Office: [phone]   Thelma Comp 02/07/2021, 1:15 PM

## 2021-02-07 NOTE — Discharge Instructions (Signed)
Wound Care Keep incision covered and dry for two days.    Do not put any creams, lotions, or ointments on incision. Leave steri-strips on back.  They will fall off by themselves.  Activity Walk each and every day, increasing distance each day. No lifting greater than 5 lbs.  Avoid excessive back motion. No driving for 2 weeks; may ride as a passenger locally.  Diet Resume your normal diet.   Return to Work Will be discussed at your follow up appointment.  Call Your Doctor If Any of These Occur Redness, drainage, or swelling at the wound.  Temperature greater than 101 degrees. Severe pain not relieved by pain medication. Incision starts to come apart.  Follow Up Appt Appointment scheduled. Call [phone] for appointment changes or problems.

## 2021-02-07 NOTE — Discharge Summary (Signed)
Physician Discharge Summary     Providing Compassionate, Quality Care - Together   Patient ID: Michael Garner MRN: [id number] DOB/AGE: October 23, 1955 65 y.o.  Admit date: 02/06/2021 Discharge date: 02/07/2021  Admission Diagnoses: Spinal stenosis of lumbar region with neurogenic claudication  Discharge Diagnoses:  Active Problems:   Spinal stenosis of lumbar region with neurogenic claudication   Discharged Condition: good  Hospital Course: Patient underwent bilateral L2-3 and redo L3-4 laminotomy/foraminotomy by Dr. Arnoldo Morale on 02/06/2021. He was admitted to 3C09 following recovery from anesthesia in the PACU. His postoperative course has been uncomplicated. He is ambulating independently and without difficulty. He is tolerating a normal diet. He is not having any bowel or bladder dysfunction. His pain is well-controlled with oral pain medication. He is ready for discharge home.   Consults: None  Significant Diagnostic Studies: radiology: DG Lumbar Spine 1 View  Result Date: 02/06/2021 CLINICAL DATA:  L2-3 and L3-4 laminectomy. EXAM: LUMBAR SPINE - 1 VIEW COMPARISON:  January 05, 2021. FINDINGS: Single intraoperative cross-table lateral projection was obtained of the lumbar spine. These images demonstrate surgical probe at approximately the L2-3 level. IMPRESSION: Surgical localization as described above. Electronically Signed   By: Marijo Conception M.D.   On: 02/06/2021 11:18     Treatments: surgery: Bilateral L2-3 and redo L3-4 laminotomy/foraminotomy  using micro-dissection  Discharge Exam: Blood pressure 113/66, pulse 68, temperature 98 F (36.7 C), temperature source Oral, resp. rate 18, height 5\' 11"  (1.803 m), weight 113.4 kg, SpO2 97 %.  Alert and oriented x 4 PERRLA CN II-XII grossly intact MAE, Strength and sensation intact Incision is covered with Honeycomb dressing and Steri Strips; Dressing is dry and intact with a scant amount of dried sanguinous  drainage   Disposition: Discharge disposition: 01-Home or Self Care        Allergies as of 02/07/2021       Reactions   Dextromethorphan Other (See Comments)   Per patient "feels like I'm having a heart attack."  Tolerates plain guaifenesin.   Penicillins    Not sure but immediate family members are allergic   Prednisone Palpitations   Patient "put me in afib"        Medication List     STOP taking these medications    diclofenac 75 MG EC tablet Commonly known as: VOLTAREN   ibuprofen 200 MG tablet Commonly known as: ADVIL   terbinafine 250 MG tablet Commonly known as: LamISIL       TAKE these medications    cetirizine 10 MG tablet Commonly known as: ZYRTEC Take 10 mg by mouth daily.   cyclobenzaprine 10 MG tablet Commonly known as: FLEXERIL Take 1 tablet (10 mg total) by mouth 3 (three) times daily as needed for muscle spasms.   diltiazem 360 MG 24 hr capsule Commonly known as: CARDIZEM CD Take 1 capsule (360 mg total) by mouth daily.   docusate sodium 100 MG capsule Commonly known as: COLACE Take 1 capsule (100 mg total) by mouth 2 (two) times daily.   mometasone 50 MCG/ACT nasal spray Commonly known as: NASONEX Place 1 spray into the nose daily.   multivitamin with minerals tablet Take 1 tablet by mouth daily.   OVER THE COUNTER MEDICATION Take 2 capsules by mouth in the morning and at bedtime. OMEGA XL   OVER THE COUNTER MEDICATION Take 3 capsules by mouth daily. Prostagenics   Oxycodone HCl 10 MG Tabs Take 1 tablet (10 mg total) by mouth every 4 (four) hours as needed  for severe pain ((score 7 to 10)).   tadalafil 20 MG tablet Commonly known as: CIALIS Take 1 tablet (20 mg total) by mouth as needed for erectile dysfunction.        Follow-up Information     Newman Pies, MD. Go on 03/07/2021.   Specialty: Neurosurgery Why: First post op appointment is on 03/07/2021 at 8:45 AM. Contact information: [address] [place] [address] [phone]                 Signed: Viona Gilmore, DNP, AGNP-C Nurse Practitioner  [facility] [place] [address], [place], [place] [address] P: [phone]    F: [phone]  02/07/2021, 11:12 AM

## 2021-03-09 ENCOUNTER — Encounter: Payer: Self-pay | Admitting: Family Medicine

## 2021-03-10 MED ORDER — DUTASTERIDE 0.5 MG PO CAPS: 0.5000 mg | ORAL_CAPSULE | Freq: Every day | ORAL | 5 refills | Status: DC

## 2021-03-10 NOTE — Telephone Encounter (Signed)
I sent in Avodart (dutasteride) to take once a day. This is what he took in 2013

## 2021-03-21 ENCOUNTER — Encounter: Payer: Self-pay | Admitting: Family Medicine

## 2021-03-22 ENCOUNTER — Encounter: Payer: Self-pay | Admitting: Podiatry

## 2021-04-04 ENCOUNTER — Other Ambulatory Visit: Payer: Self-pay | Admitting: *Deleted

## 2021-04-04 MED ORDER — DILTIAZEM HCL ER COATED BEADS 360 MG PO CP24: 360.0000 mg | ORAL_CAPSULE | Freq: Every day | ORAL | 0 refills | Status: DC

## 2021-04-05 ENCOUNTER — Encounter: Payer: Self-pay | Admitting: Family Medicine

## 2021-04-06 NOTE — Telephone Encounter (Signed)
No the father does not need to be present, but have him write out a signed permission for [place] to see her. She can bring this with her to the appt

## 2021-05-01 ENCOUNTER — Other Ambulatory Visit: Payer: Self-pay | Admitting: *Deleted

## 2021-05-01 MED ORDER — DILTIAZEM HCL ER COATED BEADS 360 MG PO CP24: 360.0000 mg | ORAL_CAPSULE | Freq: Every day | ORAL | 0 refills | Status: DC

## 2021-05-18 ENCOUNTER — Other Ambulatory Visit: Payer: Self-pay

## 2021-05-18 MED ORDER — DILTIAZEM HCL ER COATED BEADS 360 MG PO CP24: 360.0000 mg | ORAL_CAPSULE | Freq: Every day | ORAL | 1 refills | Status: DC

## 2021-05-25 DIAGNOSIS — G4733 Obstructive sleep apnea (adult) (pediatric): Secondary | ICD-10-CM | POA: Diagnosis not present

## 2021-06-14 ENCOUNTER — Encounter: Payer: Self-pay | Admitting: Family Medicine

## 2021-06-14 NOTE — Telephone Encounter (Signed)
I refilled this for 3 more months. No OV is needed  ?

## 2021-06-21 NOTE — Telephone Encounter (Signed)
Message has been sent to Dr. Sarajane Jews in patients medical record requesting to be resent to different pharmacy. ?

## 2021-06-28 ENCOUNTER — Ambulatory Visit (INDEPENDENT_AMBULATORY_CARE_PROVIDER_SITE_OTHER): Payer: No Typology Code available for payment source | Admitting: Family Medicine

## 2021-06-28 ENCOUNTER — Encounter: Payer: Self-pay | Admitting: Family Medicine

## 2021-06-28 VITALS — BP 118/76 | HR 73 | Temp 97.8°F | Wt 253.0 lb

## 2021-06-28 DIAGNOSIS — E669 Obesity, unspecified: Secondary | ICD-10-CM

## 2021-06-28 MED ORDER — WEGOVY 0.5 MG/0.5ML ~~LOC~~ SOAJ: 0.5000 mg | SUBCUTANEOUS | 0 refills | Status: DC

## 2021-06-28 NOTE — Progress Notes (Signed)
? ?  Subjective:  ? ? Patient ID: Michael Garner, male    DOB: [birth year], 66 y.o.   MRN: [id number] ? ?HPI ?Here asking for help with weight loss. He has tried hard with diet and exericse to lose weight and has not been successful. His BMI is now up to 35.29. He asks to try [facility], and he has been doing some research on this. His BP has been well controlled.  ? ? ?Review of Systems  ?Constitutional: Negative.   ?Respiratory: Negative.    ?Cardiovascular: Negative.   ? ?   ?Objective:  ? Physical Exam ?Constitutional:   ?   Appearance: He is obese.  ?Cardiovascular:  ?   Rate and Rhythm: Normal rate and regular rhythm.  ?   Pulses: Normal pulses.  ?   Heart sounds: Normal heart sounds.  ?Pulmonary:  ?   Effort: Pulmonary effort is normal.  ?   Breath sounds: Normal breath sounds.  ?Neurological:  ?   Mental Status: He is alert.  ? ? ? ? ? ?   ?Assessment & Plan:  ?Obesity. We will start him on 0.'5mg'$  of Wegovy weekly for 4 weeks. He will set up a well exam with fasting labs soon. We plan to advance the dose monthly. ?Alysia Penna, MD ? ? ?

## 2021-07-14 ENCOUNTER — Other Ambulatory Visit: Payer: Self-pay | Admitting: Family Medicine

## 2021-07-18 ENCOUNTER — Telehealth: Payer: Self-pay | Admitting: *Deleted

## 2021-07-18 NOTE — Telephone Encounter (Signed)
Prior auth started for  ?Wegovy ?Key: [id number] ? ?Your information has been submitted to [employer]. [employer] will review the request and will issue a decision, typically within 1-3 days from your submission. You can check the updated outcome later by reopening this request. ?

## 2021-07-20 ENCOUNTER — Encounter: Payer: Self-pay | Admitting: Family Medicine

## 2021-07-20 ENCOUNTER — Ambulatory Visit (INDEPENDENT_AMBULATORY_CARE_PROVIDER_SITE_OTHER): Payer: No Typology Code available for payment source | Admitting: Family Medicine

## 2021-07-20 VITALS — BP 120/78 | HR 70 | Temp 97.9°F | Ht 71.0 in | Wt 244.2 lb

## 2021-07-20 DIAGNOSIS — Z23 Encounter for immunization: Secondary | ICD-10-CM | POA: Diagnosis not present

## 2021-07-20 DIAGNOSIS — Z1211 Encounter for screening for malignant neoplasm of colon: Secondary | ICD-10-CM

## 2021-07-20 DIAGNOSIS — Z0000 Encounter for general adult medical examination without abnormal findings: Secondary | ICD-10-CM

## 2021-07-20 LAB — HEPATIC FUNCTION PANEL
ALT: 36 U/L (ref 0–53)
AST: 56 U/L — ABNORMAL HIGH (ref 0–37)
Albumin: 4.5 g/dL (ref 3.5–5.2)
Alkaline Phosphatase: 72 U/L (ref 39–117)
Bilirubin, Direct: 0.1 mg/dL (ref 0.0–0.3)
Total Bilirubin: 0.5 mg/dL (ref 0.2–1.2)
Total Protein: 6.5 g/dL (ref 6.0–8.3)

## 2021-07-20 LAB — BASIC METABOLIC PANEL
BUN: 19 mg/dL (ref 6–23)
CO2: 25 mEq/L (ref 19–32)
Calcium: 9.4 mg/dL (ref 8.4–10.5)
Chloride: 105 mEq/L (ref 96–112)
Creatinine, Ser: 0.8 mg/dL (ref 0.40–1.50)
GFR: 92.62 mL/min (ref >60.00)
Glucose, Bld: 86 mg/dL (ref 70–99)
Potassium: 4.2 mEq/L (ref 3.5–5.1)
Sodium: 137 mEq/L (ref 135–145)

## 2021-07-20 LAB — LIPID PANEL
Cholesterol: 180 mg/dL (ref 0–200)
HDL: 59.6 mg/dL (ref >39.00)
LDL Cholesterol: 107 mg/dL — ABNORMAL HIGH (ref 0–99)
NonHDL: 120.08
Total CHOL/HDL Ratio: 3
Triglycerides: 64 mg/dL (ref 0.0–149.0)
VLDL: 12.8 mg/dL (ref 0.0–40.0)

## 2021-07-20 LAB — CBC WITH DIFFERENTIAL/PLATELET
Basophils Absolute: 0.1 10*3/uL (ref 0.0–0.1)
Basophils Relative: 1 % (ref 0.0–3.0)
Eosinophils Absolute: 0.2 10*3/uL (ref 0.0–0.7)
Eosinophils Relative: 3.2 % (ref 0.0–5.0)
HCT: 45.8 % (ref 39.0–52.0)
Hemoglobin: 15.7 g/dL (ref 13.0–17.0)
Lymphocytes Relative: 27.8 % (ref 12.0–46.0)
Lymphs Abs: 1.5 10*3/uL (ref 0.7–4.0)
MCHC: 34.2 g/dL (ref 30.0–36.0)
MCV: 88.9 fl (ref 78.0–100.0)
Monocytes Absolute: 0.4 10*3/uL (ref 0.1–1.0)
Monocytes Relative: 8.1 % (ref 3.0–12.0)
Neutro Abs: 3.1 10*3/uL (ref 1.4–7.7)
Neutrophils Relative %: 59.9 % (ref 43.0–77.0)
Platelets: 239 10*3/uL (ref 150.0–400.0)
RBC: 5.15 Mil/uL (ref 4.22–5.81)
RDW: 13.9 % (ref 11.5–15.5)
WBC: 5.2 10*3/uL (ref 4.0–10.5)

## 2021-07-20 LAB — HEMOGLOBIN A1C: Hgb A1c MFr Bld: 5.6 % (ref 4.6–6.5)

## 2021-07-20 LAB — TSH: TSH: 2.05 u[IU]/mL (ref 0.35–5.50)

## 2021-07-20 LAB — PSA: PSA: 0.51 ng/mL (ref 0.10–4.00)

## 2021-07-20 MED ORDER — TADALAFIL 20 MG PO TABS: 20.0000 mg | ORAL_TABLET | ORAL | 11 refills | Status: DC | PRN

## 2021-07-20 MED ORDER — DUTASTERIDE 0.5 MG PO CAPS: 0.5000 mg | ORAL_CAPSULE | Freq: Every day | ORAL | 3 refills | Status: DC

## 2021-07-20 MED ORDER — ALFUZOSIN HCL ER 10 MG PO TB24: 10.0000 mg | ORAL_TABLET | Freq: Every day | ORAL | 3 refills | Status: DC

## 2021-07-20 NOTE — Progress Notes (Signed)
? ?Subjective:  ? ? Patient ID: Michael Garner, male    DOB: 12-11-1955, 66 y.o.   MRN: [id number] ? ?HPI ?Here for a well exam. He feels well except for complaints about slow urine stream and nocturia times 3-4 a night. He is taking Avodart, but this does not help as much as it used to. He has tried Flomax in the past with poor results. He is trying to lose weight, and a few weeks ago we discussed him trying [facility] However this was too expensive, so he never tried it. He is watching his diet and exercising, and he has lost 11 lbs since his last visit.  ? ? ?Review of Systems  ?Constitutional: Negative.   ?HENT: Negative.    ?Eyes: Negative.   ?Respiratory: Negative.    ?Cardiovascular: Negative.   ?Gastrointestinal: Negative.   ?Genitourinary:  Positive for difficulty urinating.  ?Musculoskeletal: Negative.   ?Skin: Negative.   ?Neurological: Negative.   ?Psychiatric/Behavioral: Negative.    ? ?   ?Objective:  ? Physical Exam ?Constitutional:   ?   General: He is not in acute distress. ?   Appearance: Normal appearance. He is well-developed. He is not diaphoretic.  ?HENT:  ?   Head: Normocephalic and atraumatic.  ?   Right Ear: External ear normal.  ?   Left Ear: External ear normal.  ?   Nose: Nose normal.  ?   Mouth/Throat:  ?   Pharynx: No oropharyngeal exudate.  ?Eyes:  ?   General: No scleral icterus.    ?   Right eye: No discharge.     ?   Left eye: No discharge.  ?   Conjunctiva/sclera: Conjunctivae normal.  ?   Pupils: Pupils are equal, round, and reactive to light.  ?Neck:  ?   Thyroid: No thyromegaly.  ?   Vascular: No JVD.  ?   Trachea: No tracheal deviation.  ?Cardiovascular:  ?   Rate and Rhythm: Normal rate and regular rhythm.  ?   Heart sounds: Normal heart sounds. No murmur heard. ?  No friction rub. No gallop.  ?Pulmonary:  ?   Effort: Pulmonary effort is normal. No respiratory distress.  ?   Breath sounds: Normal breath sounds. No wheezing or rales.  ?Chest:  ?   Chest wall: No tenderness.   ?Abdominal:  ?   General: Bowel sounds are normal. There is no distension.  ?   Palpations: Abdomen is soft. There is no mass.  ?   Tenderness: There is no abdominal tenderness. There is no guarding or rebound.  ?Genitourinary: ?   Penis: Normal. No tenderness.   ?   Testes: Normal.  ?   Prostate: Normal.  ?   Rectum: Normal. Guaiac result negative.  ?Musculoskeletal:     ?   General: No tenderness. Normal range of motion.  ?   Cervical back: Neck supple.  ?Lymphadenopathy:  ?   Cervical: No cervical adenopathy.  ?Skin: ?   General: Skin is warm and dry.  ?   Coloration: Skin is not pale.  ?   Findings: No erythema or rash.  ?Neurological:  ?   Mental Status: He is alert and oriented to person, place, and time.  ?   Cranial Nerves: No cranial nerve deficit.  ?   Motor: No abnormal muscle tone.  ?   Coordination: Coordination normal.  ?   Deep Tendon Reflexes: Reflexes are normal and symmetric. Reflexes normal.  ?Psychiatric:     ?  Behavior: Behavior normal.     ?   Thought Content: Thought content normal.     ?   Judgment: Judgment normal.  ? ? ? ? ? ?   ?Assessment & Plan:  ?Well exam. We discussed diet and exercise. Get fasting labs today. He will try adding Uroxotral daily to his Avodart to help with BPH symptoms.  ?Alysia Penna, MD ? ? ?

## 2021-08-02 ENCOUNTER — Encounter: Payer: Self-pay | Admitting: Family Medicine

## 2021-08-03 NOTE — Telephone Encounter (Signed)
Make an OV so we can check this out  ?

## 2021-08-22 ENCOUNTER — Encounter: Payer: Self-pay | Admitting: Gastroenterology

## 2021-08-29 ENCOUNTER — Ambulatory Visit (INDEPENDENT_AMBULATORY_CARE_PROVIDER_SITE_OTHER): Payer: No Typology Code available for payment source

## 2021-08-29 VITALS — Ht 71.0 in | Wt 244.0 lb

## 2021-08-29 DIAGNOSIS — Z0000 Encounter for general adult medical examination without abnormal findings: Secondary | ICD-10-CM

## 2021-08-29 NOTE — Progress Notes (Signed)
Subjective:   Michael Garner is a 66 y.o. male who presents for Medicare Annual/Subsequent preventive examination.  Review of Systems    Virtual Visit via Telephone Note  I connected with  Michael Garner on 08/29/21 at  9:45 AM EDT by telephone and verified that I am speaking with the correct person using two identifiers.  Location: Patient: Home Provider: Office Persons participating in the virtual visit: patient/Nurse Health Advisor   I discussed the limitations, risks, security and privacy concerns of performing an evaluation and management service by telephone and the availability of in person appointments. The patient expressed understanding and agreed to proceed.  Interactive audio and video telecommunications were attempted between this nurse and patient, however failed, due to patient having technical difficulties OR patient did not have access to video capability.  We continued and completed visit with audio only.  Some vital signs may be absent or patient reported.   Criselda Peaches, LPN  Cardiac Risk Factors include: advanced age (>3mn, >>100women);male gender     Objective:    Today's Vitals   08/29/21 0946  Weight: 244 lb (110.7 kg)  Height: 5' 11"  (1.803 m)   Body mass index is 34.03 kg/m.     08/29/2021    9:56 AM 02/06/2021    6:36 AM 02/02/2021   11:44 AM 03/08/2020    8:39 AM 03/04/2020    6:08 AM 05/16/2018    1:56 AM 01/13/2018   12:35 PM  Advanced Directives  Does Patient Have a Medical Advance Directive? No No No No No No No  Would patient like information on creating a medical advance directive? No - Patient declined No - Patient declined No - Patient declined No - Patient declined No - Patient declined No - Patient declined No - Patient declined    Current Medications (verified) Outpatient Encounter Medications as of 08/29/2021  Medication Sig   alfuzosin (UROXATRAL) 10 MG 24 hr tablet Take 1 tablet (10 mg total) by mouth daily with breakfast.    cetirizine (ZYRTEC) 10 MG tablet Take 10 mg by mouth daily.   diltiazem (CARDIZEM CD) 360 MG 24 hr capsule Take 1 capsule (360 mg total) by mouth daily.   dutasteride (AVODART) 0.5 MG capsule Take 1 capsule (0.5 mg total) by mouth daily.   mometasone (NASONEX) 50 MCG/ACT nasal spray Place 1 spray into the nose daily.   Multiple Vitamins-Minerals (MULTIVITAMIN WITH MINERALS) tablet Take 1 tablet by mouth daily.   OVER THE COUNTER MEDICATION Take 2 capsules by mouth in the morning and at bedtime. OMEGA XL   OVER THE COUNTER MEDICATION Take 3 capsules by mouth daily. Prostagenics   tadalafil (CIALIS) 20 MG tablet Take 1 tablet (20 mg total) by mouth as needed for erectile dysfunction.   No facility-administered encounter medications on file as of 08/29/2021.    Allergies (verified) Dextromethorphan, Penicillins, and Prednisone   History: Past Medical History:  Diagnosis Date   A-fib ([facility]    Arthritis 2020   in hands   BPH with urinary obstruction    Erectile dysfunction    Hepatitis B infection    resolved    Pneumonia 2021   hospitalized   Pulmonary edema 03/05/2020   Past Surgical History:  Procedure Laterality Date   ATRIAL FIBRILLATION ABLATION N/A 03/04/2020   Procedure: ATRIAL FIBRILLATION ABLATION;  Surgeon: CConstance Haw MD;  [facility];  Service: Cardiovascular;  Laterality: N/A;   CARDIOVERSION  05/16/2018   COLONOSCOPY  07/18/2011   per Dr. Sharlett Iles, diverticulosis only, repeat in 10 yrs    LUMBAR LAMINECTOMY/DECOMPRESSION MICRODISCECTOMY  2018   LUMBAR LAMINECTOMY/DECOMPRESSION MICRODISCECTOMY N/A 02/06/2021   Procedure: LAMINECTOMY, FORAMINOTOMY LUMBAR TWO-THREE, LUMBAR THREE-FOUR; LEFT LUBMAR TWO-THREE DISCECTOMY;  Surgeon: Newman Pies, MD;  Location: [facility];  Service: Neurosurgery;  Laterality: N/A;  LAMINECTOMY, FORAMINOTOMY LUMBAR TWO-THREE, LUMBAR THREE-FOUR; LEFT LUBMAR TWO-THREE DISCECTOMY   LUMBAR MICRODISCECTOMY  2002   Family  History  Problem Relation Age of Onset   Alcohol abuse Father    Lung cancer Father    Social History   Socioeconomic History   Marital status: Divorced    Spouse name: Not on file   Number of children: Not on file   Years of education: Not on file   Highest education level: Not on file  Occupational History   Not on file  Tobacco Use   Smoking status: Former    Types: Cigarettes    Quit date: 2002    Years since quitting: 21.4   Smokeless tobacco: Never  Vaping Use   Vaping Use: Never used  Substance and Sexual Activity   Alcohol use: Yes    Alcohol/week: 10.0 standard drinks    Types: 10 Cans of beer per week    Comment: "couple of beers after work every night"   Drug use: No   Sexual activity: Not on file  Other Topics Concern   Not on file  Social History Narrative   Not on file   Social Determinants of Health   Financial Resource Strain: Low Risk    Difficulty of Paying Living Expenses: Not hard at all  Food Insecurity: No Food Insecurity   Worried About Charity fundraiser in the Last Year: Never true   [place] in the Last Year: Never true  Transportation Needs: No Transportation Needs   Lack of Transportation (Medical): No   Lack of Transportation (Non-Medical): No  Physical Activity: Sufficiently Active   Days of Exercise per Week: 5 days   Minutes of Exercise per Session: 50 min  Stress: No Stress Concern Present   Feeling of Stress : Not at all  Social Connections: Socially Isolated   Frequency of Communication with Friends and Family: More than three times a week   Frequency of Social Gatherings with Friends and Family: More than three times a week   Attends Religious Services: Never   Marine scientist or Organizations: No   Attends Archivist Meetings: Never   Marital Status: Divorced      Clinical Intake:  Diabetic?  No   Activities of Daily Living    08/29/2021    9:54 AM 02/02/2021   11:50 AM  In your present  state of health, do you have any difficulty performing the following activities:  Hearing? 0   Vision? 0   Difficulty concentrating or making decisions? 0   Walking or climbing stairs? 0   Dressing or bathing? 0   Doing errands, shopping? 0 0  Preparing Food and eating ? N   Using the Toilet? N   In the past six months, have you accidently leaked urine? Y   Comment Followed by PCP   Do you have problems with loss of bowel control? N   Managing your Medications? N   Managing your Finances? N   Housekeeping or managing your Housekeeping? N     Patient Care Team: Laurey Morale, MD as PCP - General (Family Medicine) Radford Pax,  Eber Hong, MD as PCP - Sleep Medicine (Cardiology) [name], MD as PCP - Electrophysiology (Cardiology)  Indicate any recent Medical Services you may have received from other than [facility] providers in the past year (date may be approximate).     Assessment:   This is a routine wellness examination for Michael Garner.  Hearing/Vision screen Hearing Screening - Comments:: No hearing difficulty Vision Screening - Comments:: Wears glasses. Followed by [facility] issues and exercise activities discussed: Exercise limited by: None identified   Goals Addressed               This Visit's Progress     Patient stated (pt-stated)        I would like to lose weight.       Depression Screen    08/29/2021    9:52 AM 07/20/2021   10:38 AM 06/28/2021    3:17 PM  PHQ 2/9 Scores  PHQ - 2 Score 0 0 1  PHQ- 9 Score   4    Fall Risk    08/29/2021    9:55 AM 07/20/2021   10:38 AM 06/28/2021    3:17 PM  Fall Risk   Falls in the past year? 0 0 0  Number falls in past yr: 0 0 0  Injury with Fall? 0 0 0  Risk for fall due to : No Fall Risks No Fall Risks No Fall Risks  Follow up  Falls evaluation completed     FALL RISK PREVENTION PERTAINING TO THE HOME:  Any stairs in or around the home? Yes  If so, are there any without handrails? No  Home free  of loose throw rugs in walkways, pet beds, electrical cords, etc? Yes  Adequate lighting in your home to reduce risk of falls? Yes   ASSISTIVE DEVICES UTILIZED TO PREVENT FALLS:  Life alert? No  Use of a cane, walker or w/c? No  Grab bars in the bathroom? No  Shower chair or bench in shower? Yes  Elevated toilet seat or a handicapped toilet? No   TIMED UP AND GO:  Was the test performed? No . Audio Visit  Cognitive Function:      08/29/2021    9:57 AM  6CIT Screen  What Year? 0 points  What month? 0 points  What time? 0 points  Count back from 20 0 points  Months in reverse 0 points  Repeat phrase 0 points  Total Score 0 points    Immunizations Immunization History  Administered Date(s) Administered   Influenza Split 12/24/2011   Influenza Whole 01/04/2010   Influenza,inj,Quad PF,6+ Mos 05/19/2013, 02/22/2015, 01/24/2016, 01/01/2018   Influenza,inj,quad, With Preservative 05/19/2013, 02/22/2015, 01/24/2016, 01/01/2018   Influenza-Unspecified 12/24/2011, 01/12/2020   MMR 09/25/2017   Moderna Sars-Covid-2 Vaccination 06/10/2019, 07/10/2019, 01/12/2020   PNEUMOCOCCAL CONJUGATE-20 07/20/2021   Tdap 01/01/2018   Zoster Recombinat (Shingrix) 03/16/2018, 08/25/2018    TDAP status: Up to date  Flu Vaccine status: Up to date  Pneumococcal vaccine status: Up to date  Covid-19 vaccine status: Completed vaccines  Qualifies for Shingles Vaccine? Yes   Zostavax completed Yes   Shingrix Completed?: Yes  Screening Tests Health Maintenance  Topic Date Due   COVID-19 Vaccine (4 - Booster for Moderna series) 09/14/2021 (Originally 03/08/2020)   COLONOSCOPY (Pts 45-35yr Insurance coverage will need to be confirmed)  08/30/2022 (Originally 07/17/2021)   INFLUENZA VACCINE  11/07/2021   TETANUS/TDAP  01/02/2028   Pneumonia Vaccine 66 Years old  Completed  Hepatitis C Screening  Completed   Zoster Vaccines- Shingrix  Completed   HPV VACCINES  Aged Out    Health  Maintenance  There are no preventive care reminders to display for this patient.   Colorectal cancer screening: Referral to GI placed Scheduled 10/13/21. Pt aware the office will call re: appt.  Lung Cancer Screening: (Low Dose CT Chest recommended if Age 1-80 years, 30 pack-year currently smoking OR have quit w/in 15years.) does not qualify.     Additional Screening:  Hepatitis C Screening: does qualify; Completed 09/25/17  Vision Screening: Recommended annual ophthalmology exams for early detection of glaucoma and other disorders of the eye. Is the patient up to date with their annual eye exam?  Yes  Who is the provider or what is the name of the office in which the patient attends annual eye exams? [place] If pt is not established with a provider, would they like to be referred to a provider to establish care? No .   Dental Screening: Recommended annual dental exams for proper oral hygiene  Community Resource Referral / Chronic Care Management:  CRR required this visit?  No   CCM required this visit?  No      Plan:     I have personally reviewed and noted the following in the patient's chart:   Medical and social history Use of alcohol, tobacco or illicit drugs  Current medications and supplements including opioid prescriptions. Patient is not currently taking opioid prescriptions. Functional ability and status Nutritional status Physical activity Advanced directives List of other physicians Hospitalizations, surgeries, and ER visits in previous 12 months Vitals Screenings to include cognitive, depression, and falls Referrals and appointments  In addition, I have reviewed and discussed with patient certain preventive protocols, quality metrics, and best practice recommendations. A written personalized care plan for preventive services as well as general preventive health recommendations were provided to patient.     Criselda Peaches, LPN   2/90/2111    Nurse Notes:   None

## 2021-08-29 NOTE — Patient Instructions (Addendum)
Michael Garner , Thank you for taking time to come for your Medicare Wellness Visit. I appreciate your ongoing commitment to your health goals. Please review the following plan we discussed and let me know if I can assist you in the future.   These are the goals we discussed:  Goals       Patient stated (pt-stated)      I would like to lose weight.        This is a list of the screening recommended for you and due dates:  Health Maintenance  Topic Date Due   COVID-19 Vaccine (4 - Booster for Moderna series) 09/14/2021*   Colon Cancer Screening  08/30/2022*   Flu Shot  11/07/2021   Tetanus Vaccine  01/02/2028   Pneumonia Vaccine  Completed   Hepatitis C Screening: USPSTF Recommendation to screen - Ages 18-79 yo.  Completed   Zoster (Shingles) Vaccine  Completed   HPV Vaccine  Aged Out  *Topic was postponed. The date shown is not the original due date.   Advanced directives: No   Conditions/risks identified: None  Next appointment: Follow up in one year for your annual wellness visit.    Preventive Care 42 Years and Older, Male Preventive care refers to lifestyle choices and visits with your health care provider that can promote health and wellness. What does preventive care include? A yearly physical exam. This is also called an annual well check. Dental exams once or twice a year. Routine eye exams. Ask your health care provider how often you should have your eyes checked. Personal lifestyle choices, including: Daily care of your teeth and gums. Regular physical activity. Eating a healthy diet. Avoiding tobacco and drug use. Limiting alcohol use. Practicing safe sex. Taking low doses of aspirin every day. Taking vitamin and mineral supplements as recommended by your health care provider. What happens during an annual well check? The services and screenings done by your health care provider during your annual well check will depend on your age, overall health, lifestyle  risk factors, and family history of disease. Counseling  Your health care provider may ask you questions about your: Alcohol use. Tobacco use. Drug use. Emotional well-being. Home and relationship well-being. Sexual activity. Eating habits. History of falls. Memory and ability to understand (cognition). Work and work Statistician. Screening  You may have the following tests or measurements: Height, weight, and BMI. Blood pressure. Lipid and cholesterol levels. These may be checked every 5 years, or more frequently if you are over 79 years old. Skin check. Lung cancer screening. You may have this screening every year starting at age 77 if you have a 30-pack-year history of smoking and currently smoke or have quit within the past 15 years. Fecal occult blood test (FOBT) of the stool. You may have this test every year starting at age 62. Flexible sigmoidoscopy or colonoscopy. You may have a sigmoidoscopy every 5 years or a colonoscopy every 10 years starting at age 34. Prostate cancer screening. Recommendations will vary depending on your family history and other risks. Hepatitis C blood test. Hepatitis B blood test. Sexually transmitted disease (STD) testing. Diabetes screening. This is done by checking your blood sugar (glucose) after you have not eaten for a while (fasting). You may have this done every 1-3 years. Abdominal aortic aneurysm (AAA) screening. You may need this if you are a current or former smoker. Osteoporosis. You may be screened starting at age 46 if you are at high risk. Talk with your  health care provider about your test results, treatment options, and if necessary, the need for more tests. Vaccines  Your health care provider may recommend certain vaccines, such as: Influenza vaccine. This is recommended every year. Tetanus, diphtheria, and acellular pertussis (Tdap, Td) vaccine. You may need a Td booster every 10 years. Zoster vaccine. You may need this after age  19. Pneumococcal 13-valent conjugate (PCV13) vaccine. One dose is recommended after age 2. Pneumococcal polysaccharide (PPSV23) vaccine. One dose is recommended after age 50. Talk to your health care provider about which screenings and vaccines you need and how often you need them. This information is not intended to replace advice given to you by your health care provider. Make sure you discuss any questions you have with your health care provider. Document Released: 04/22/2015 Document Revised: 12/14/2015 Document Reviewed: 01/25/2015 Elsevier Interactive Patient Education  2017 [place] Prevention in the Home Falls can cause injuries. They can happen to people of all ages. There are many things you can do to make your home safe and to help prevent falls. What can I do on the outside of my home? Regularly fix the edges of walkways and driveways and fix any cracks. Remove anything that might make you trip as you walk through a door, such as a raised step or threshold. Trim any bushes or trees on the path to your home. Use bright outdoor lighting. Clear any walking paths of anything that might make someone trip, such as rocks or tools. Regularly check to see if handrails are loose or broken. Make sure that both sides of any steps have handrails. Any raised decks and porches should have guardrails on the edges. Have any leaves, snow, or ice cleared regularly. Use sand or salt on walking paths during winter. Clean up any spills in your garage right away. This includes oil or grease spills. What can I do in the bathroom? Use night lights. Install grab bars by the toilet and in the tub and shower. Do not use towel bars as grab bars. Use non-skid mats or decals in the tub or shower. If you need to sit down in the shower, use a plastic, non-slip stool. Keep the floor dry. Clean up any water that spills on the floor as soon as it happens. Remove soap buildup in the tub or shower  regularly. Attach bath mats securely with double-sided non-slip rug tape. Do not have throw rugs and other things on the floor that can make you trip. What can I do in the bedroom? Use night lights. Make sure that you have a light by your bed that is easy to reach. Do not use any sheets or blankets that are too big for your bed. They should not hang down onto the floor. Have a firm chair that has side arms. You can use this for support while you get dressed. Do not have throw rugs and other things on the floor that can make you trip. What can I do in the kitchen? Clean up any spills right away. Avoid walking on wet floors. Keep items that you use a lot in easy-to-reach places. If you need to reach something above you, use a strong step stool that has a grab bar. Keep electrical cords out of the way. Do not use floor polish or wax that makes floors slippery. If you must use wax, use non-skid floor wax. Do not have throw rugs and other things on the floor that can make you trip. What  can I do with my stairs? Do not leave any items on the stairs. Make sure that there are handrails on both sides of the stairs and use them. Fix handrails that are broken or loose. Make sure that handrails are as long as the stairways. Check any carpeting to make sure that it is firmly attached to the stairs. Fix any carpet that is loose or worn. Avoid having throw rugs at the top or bottom of the stairs. If you do have throw rugs, attach them to the floor with carpet tape. Make sure that you have a light switch at the top of the stairs and the bottom of the stairs. If you do not have them, ask someone to add them for you. What else can I do to help prevent falls? Wear shoes that: Do not have high heels. Have rubber bottoms. Are comfortable and fit you well. Are closed at the toe. Do not wear sandals. If you use a stepladder: Make sure that it is fully opened. Do not climb a closed stepladder. Make sure that  both sides of the stepladder are locked into place. Ask someone to hold it for you, if possible. Clearly mark and make sure that you can see: Any grab bars or handrails. First and last steps. Where the edge of each step is. Use tools that help you move around (mobility aids) if they are needed. These include: Canes. Walkers. Scooters. Crutches. Turn on the lights when you go into a dark area. Replace any light bulbs as soon as they burn out. Set up your furniture so you have a clear path. Avoid moving your furniture around. If any of your floors are uneven, fix them. If there are any pets around you, be aware of where they are. Review your medicines with your doctor. Some medicines can make you feel dizzy. This can increase your chance of falling. Ask your doctor what other things that you can do to help prevent falls. This information is not intended to replace advice given to you by your health care provider. Make sure you discuss any questions you have with your health care provider. Document Released: 01/20/2009 Document Revised: 09/01/2015 Document Reviewed: 04/30/2014 Elsevier Interactive Patient Education  2017 [employer].

## 2021-09-13 ENCOUNTER — Ambulatory Visit (AMBULATORY_SURGERY_CENTER): Payer: Self-pay | Admitting: *Deleted

## 2021-09-13 VITALS — Ht 71.0 in | Wt 246.0 lb

## 2021-09-13 DIAGNOSIS — Z1211 Encounter for screening for malignant neoplasm of colon: Secondary | ICD-10-CM

## 2021-09-13 NOTE — Progress Notes (Signed)
No egg or soy allergy known to patient  No issues known to pt with past sedation with any surgeries or procedures Patient denies ever being told they had issues or difficulty with intubation  No FH of Malignant Hyperthermia Pt is not on diet pills Pt is not on  home 02  Pt is not on blood thinners  Pt denies issues with constipation  No A fib or A flutter   PV completed over the phone. Pt verified name, DOB, address and insurance during PV today.  Pt given instruction packet with copy of consent form to read and sign after procedure and instructions explained and questions answered. Pt encouraged to call with questions or issues.

## 2021-10-03 ENCOUNTER — Encounter: Payer: Self-pay | Admitting: Family Medicine

## 2021-10-09 NOTE — Telephone Encounter (Signed)
My understanding is for the last 24 hours he can take in clear liquids, but no need to totally fast (water, ginger ale, and broth)

## 2021-10-11 ENCOUNTER — Encounter: Payer: No Typology Code available for payment source | Admitting: Gastroenterology

## 2021-10-13 ENCOUNTER — Encounter: Payer: Self-pay | Admitting: Gastroenterology

## 2021-10-13 ENCOUNTER — Ambulatory Visit (AMBULATORY_SURGERY_CENTER): Payer: No Typology Code available for payment source | Admitting: Gastroenterology

## 2021-10-13 VITALS — BP 111/56 | HR 56 | Temp 97.5°F | Resp 10 | Ht 71.0 in | Wt 246.0 lb

## 2021-10-13 DIAGNOSIS — Z1211 Encounter for screening for malignant neoplasm of colon: Secondary | ICD-10-CM | POA: Diagnosis not present

## 2021-10-13 DIAGNOSIS — D123 Benign neoplasm of transverse colon: Secondary | ICD-10-CM

## 2021-10-13 DIAGNOSIS — I4891 Unspecified atrial fibrillation: Secondary | ICD-10-CM | POA: Diagnosis not present

## 2021-10-13 DIAGNOSIS — D120 Benign neoplasm of cecum: Secondary | ICD-10-CM

## 2021-10-13 DIAGNOSIS — D122 Benign neoplasm of ascending colon: Secondary | ICD-10-CM

## 2021-10-13 HISTORY — PX: COLONOSCOPY: SHX174

## 2021-10-13 MED ORDER — SODIUM CHLORIDE 0.9 % IV SOLN
500.0000 mL | Freq: Once | INTRAVENOUS | Status: DC
Start: 2021-10-13 — End: 2021-10-13

## 2021-10-13 NOTE — Progress Notes (Signed)
Sedate, gd SR, tolerated procedure well, VSS, report to RN 

## 2021-10-13 NOTE — Op Note (Signed)
[place] Patient Name: Michael Garner Procedure Date: 10/13/2021 9:10 AM MRN: [id number] Endoscopist: Nicki Reaper E. Candis Schatz , MD Age: 66 Referring MD:  Date of Birth: 1955-12-22 Gender: Male Account #: [id number] Procedure:                Colonoscopy Indications:              Screening for colorectal malignant neoplasm (last                            colonoscopy was 10 years ago) Medicines:                Monitored Anesthesia Care Procedure:                Pre-Anesthesia Assessment:                           - Prior to the procedure, a History and Physical                            was performed, and patient medications and                            allergies were reviewed. The patient's tolerance of                            previous anesthesia was also reviewed. The risks                            and benefits of the procedure and the sedation                            options and risks were discussed with the patient.                            All questions were answered, and informed consent                            was obtained. Prior Anticoagulants: The patient has                            taken no previous anticoagulant or antiplatelet                            agents except for aspirin. ASA Grade Assessment: II                            - A patient with mild systemic disease. After                            reviewing the risks and benefits, the patient was                            deemed in satisfactory condition to undergo the  procedure.                           After obtaining informed consent, the colonoscope                            was passed under direct vision. Throughout the                            procedure, the patient's blood pressure, pulse, and                            oxygen saturations were monitored continuously. The                            Colonoscope was introduced through the anus and                             advanced to the the terminal ileum, with                            identification of the appendiceal orifice and IC                            valve. The colonoscopy was performed without                            difficulty. The patient tolerated the procedure                            well. The quality of the bowel preparation was                            adequate. The terminal ileum, ileocecal valve,                            appendiceal orifice, and rectum were photographed.                            The bowel preparation used was Miralax via split                            dose instruction. Scope In: 9:12:53 AM Scope Out: 9:39:20 AM Scope Withdrawal Time: 0 hours 21 minutes 23 seconds  Total Procedure Duration: 0 hours 26 minutes 27 seconds  Findings:                 The perianal and digital rectal examinations were                            normal. Pertinent negatives include normal                            sphincter tone and no palpable rectal lesions.  A 5 mm polyp was found in the ascending colon. The                            polyp was sessile. The polyp was removed with a                            cold snare. Resection and retrieval were complete.                            Estimated blood loss was minimal.                           Two sessile polyps were found in the cecum. The                            polyps were 3 to 4 mm in size. These polyps were                            removed with a cold snare. Resection and retrieval                            were complete. Estimated blood loss was minimal.                           A 5 mm polyp was found in the transverse colon. The                            polyp was sessile. The polyp was removed with a                            cold snare. Resection and retrieval were complete.                            Estimated blood loss was minimal.                           Two  sessile polyps were found in the splenic                            flexure. The polyps were 4 to 5 mm in size. These                            polyps were removed with a cold snare. Resection                            and retrieval were complete. Estimated blood loss                            was minimal.                           Many small and large-mouthed diverticula were found  in the sigmoid colon and descending colon.                           The exam was otherwise normal throughout the                            examined colon.                           The terminal ileum appeared normal.                           The retroflexed view of the distal rectum and anal                            verge was normal and showed no anal or rectal                            abnormalities. Complications:            No immediate complications. Estimated Blood Loss:     Estimated blood loss was minimal. Impression:               - One 5 mm polyp in the ascending colon, removed                            with a cold snare. Resected and retrieved.                           - Two 3 to 4 mm polyps in the cecum, removed with a                            cold snare. Resected and retrieved.                           - One 5 mm polyp in the transverse colon, removed                            with a cold snare. Resected and retrieved.                           - Two 4 to 5 mm polyps at the splenic flexure,                            removed with a cold snare. Resected and retrieved.                           - Diverticulosis in the sigmoid colon and in the                            descending colon.                           - The examined portion of the ileum was normal.                           -  The distal rectum and anal verge are normal on                            retroflexion view. Recommendation:           - Patient has a contact number available for                             emergencies. The signs and symptoms of potential                            delayed complications were discussed with the                            patient. Return to normal activities tomorrow.                            Written discharge instructions were provided to the                            patient.                           - Resume previous diet.                           - Continue present medications.                           - Await pathology results.                           - Repeat colonoscopy (date not yet determined) for                            surveillance based on pathology results. Francile Woolford E. Candis Schatz, MD 10/13/2021 9:47:23 AM This report has been signed electronically.

## 2021-10-13 NOTE — Progress Notes (Signed)
Called to room to assist during endoscopic procedure.  Patient ID and intended procedure confirmed with present staff. Received instructions for my participation in the procedure from the performing physician.  

## 2021-10-13 NOTE — Progress Notes (Signed)
[facility] History and Physical   Primary Care Physician:  Laurey Morale, MD   Reason for Procedure:   Colon cancer screening  Plan:    Screening colonoscopy     HPI: Michael Garner is a 66 y.o. male undergoing average risk screening colonoscopy.  He has no family history of colon cancer and no chronic GI symptoms. He had a colonoscopy in 2013 in which diverticulosis was noted, but no polyps were found.   Past Medical History:  Diagnosis Date   A-fib [facility])    Arthritis 2020   in hands   BPH with urinary obstruction    Erectile dysfunction    Hepatitis B infection    resolved    Pneumonia 2021   hospitalized   Pulmonary edema 03/05/2020    Past Surgical History:  Procedure Laterality Date   ATRIAL FIBRILLATION ABLATION N/A 03/04/2020   Procedure: ATRIAL FIBRILLATION ABLATION;  Surgeon: [name], MD;  Location: [facility];  Service: Cardiovascular;  Laterality: N/A;   CARDIOVERSION  05/16/2018   COLONOSCOPY  07/18/2011   per Dr. Sharlett Iles, diverticulosis only, repeat in 10 yrs    LUMBAR LAMINECTOMY/DECOMPRESSION MICRODISCECTOMY  2018   LUMBAR LAMINECTOMY/DECOMPRESSION MICRODISCECTOMY N/A 02/06/2021   Procedure: LAMINECTOMY, FORAMINOTOMY LUMBAR TWO-THREE, LUMBAR THREE-FOUR; LEFT LUBMAR TWO-THREE DISCECTOMY;  Surgeon: Newman Pies, MD;  Location: [facility];  Service: Neurosurgery;  Laterality: N/A;  LAMINECTOMY, FORAMINOTOMY LUMBAR TWO-THREE, LUMBAR THREE-FOUR; LEFT LUBMAR TWO-THREE DISCECTOMY   LUMBAR MICRODISCECTOMY  2002    Prior to Admission medications   Medication Sig Start Date End Date Taking? Authorizing Provider  alfuzosin (UROXATRAL) 10 MG 24 hr tablet Take 1 tablet (10 mg total) by mouth daily with breakfast. 07/20/21  Yes Laurey Morale, MD  cetirizine (ZYRTEC) 10 MG tablet Take 10 mg by mouth daily.   Yes [provider]  diltiazem (CARDIZEM CD) 360 MG 24 hr capsule Take 1 capsule (360 mg total) by mouth daily. 05/18/21   Yes Camnitz, Will Hassell Done, MD  dutasteride (AVODART) 0.5 MG capsule Take 1 capsule (0.5 mg total) by mouth daily. 07/20/21  Yes Laurey Morale, MD  mometasone (NASONEX) 50 MCG/ACT nasal spray Place 1 spray into the nose daily.   Yes [provider]  Multiple Vitamins-Minerals (MULTIVITAMIN WITH MINERALS) tablet Take 1 tablet by mouth daily.   Yes [provider]  OVER THE COUNTER MEDICATION Take 2 capsules by mouth in the morning and at bedtime. OMEGA XL   Yes [provider]  OVER THE COUNTER MEDICATION Take 3 capsules by mouth daily. Prostagenics Patient not taking: Reported on 09/13/2021    [provider]  tadalafil (CIALIS) 20 MG tablet Take 1 tablet (20 mg total) by mouth as needed for erectile dysfunction. 07/20/21 10/11/32  Laurey Morale, MD    Current Outpatient Medications  Medication Sig Dispense Refill   alfuzosin (UROXATRAL) 10 MG 24 hr tablet Take 1 tablet (10 mg total) by mouth daily with breakfast. 90 tablet 3   cetirizine (ZYRTEC) 10 MG tablet Take 10 mg by mouth daily.     diltiazem (CARDIZEM CD) 360 MG 24 hr capsule Take 1 capsule (360 mg total) by mouth daily. 90 capsule 1   dutasteride (AVODART) 0.5 MG capsule Take 1 capsule (0.5 mg total) by mouth daily. 90 capsule 3   mometasone (NASONEX) 50 MCG/ACT nasal spray Place 1 spray into the nose daily.     Multiple Vitamins-Minerals (MULTIVITAMIN WITH MINERALS) tablet Take 1 tablet by mouth daily.  OVER THE COUNTER MEDICATION Take 2 capsules by mouth in the morning and at bedtime. OMEGA XL     OVER THE COUNTER MEDICATION Take 3 capsules by mouth daily. Prostagenics (Patient not taking: Reported on 09/13/2021)     tadalafil (CIALIS) 20 MG tablet Take 1 tablet (20 mg total) by mouth as needed for erectile dysfunction. 10 tablet 11   Current Facility-Administered Medications  Medication Dose Route Frequency Provider Last Rate Last Admin   0.9 %  sodium chloride infusion  500 mL Intravenous Once  Daryel November, MD        Allergies as of 10/13/2021 - Review Complete 10/13/2021  Allergen Reaction Noted   Dextromethorphan Other (See Comments) 03/08/2020   Penicillins  05/12/2008   Prednisone Palpitations 09/10/2019    Family History  Problem Relation Age of Onset   Alcohol abuse Father    Lung cancer Father    Colon cancer Neg Hx    Colon polyps Neg Hx    Esophageal cancer Neg Hx    Stomach cancer Neg Hx    Rectal cancer Neg Hx     Social History   Socioeconomic History   Marital status: Divorced    Spouse name: Not on file   Number of children: Not on file   Years of education: Not on file   Highest education level: Not on file  Occupational History   Not on file  Tobacco Use   Smoking status: Former    Types: Cigarettes    Quit date: 2002    Years since quitting: 21.5   Smokeless tobacco: Never  Vaping Use   Vaping Use: Never used  Substance and Sexual Activity   Alcohol use: Yes    Alcohol/week: 10.0 standard drinks of alcohol    Types: 10 Cans of beer per week    Comment: "couple of beers after work every night"   Drug use: No   Sexual activity: Not on file  Other Topics Concern   Not on file  Social History Narrative   Not on file   Social Determinants of Health   Financial Resource Strain: Low Risk  (08/29/2021)   Overall Financial Resource Strain (CARDIA)    Difficulty of Paying Living Expenses: Not hard at all  Food Insecurity: No Food Insecurity (08/29/2021)   Hunger Vital Sign    Worried About Running Out of Food in the Last Year: Never true    Ran Out of Food in the Last Year: Never true  Transportation Needs: No Transportation Needs (08/29/2021)   PRAPARE - Hydrologist (Medical): No    Lack of Transportation (Non-Medical): No  Physical Activity: Sufficiently Active (08/29/2021)   Exercise Vital Sign    Days of Exercise per Week: 5 days    Minutes of Exercise per Session: 50 min  Stress: No Stress  Concern Present (08/29/2021)   [place]    Feeling of Stress : Not at all  Social Connections: Socially Isolated (08/29/2021)   Social Connection and Isolation Panel [NHANES]    Frequency of Communication with Friends and Family: More than three times a week    Frequency of Social Gatherings with Friends and Family: More than three times a week    Attends Religious Services: Never    Marine scientist or Organizations: No    Attends Archivist Meetings: Never    Marital Status: Divorced  Human resources officer Violence: Not  At Risk (08/29/2021)   Humiliation, Afraid, Rape, and Kick questionnaire    Fear of Current or Ex-Partner: No    Emotionally Abused: No    Physically Abused: No    Sexually Abused: No    Review of Systems:  All other review of systems negative except as mentioned in the HPI.  Physical Exam: Vital signs BP 115/76   Pulse 70   Temp (!) 97.5 F (36.4 C) (Temporal)   Ht '5\' 11"'$  (1.803 m)   Wt 246 lb (111.6 kg)   SpO2 96%   BMI 34.31 kg/m   General:   Alert,  Well-developed, well-nourished, pleasant and cooperative in NAD Airway:  Mallampati 2 Lungs:  Clear throughout to auscultation.   Heart:  Regular rate and rhythm; no murmurs, clicks, rubs,  or gallops. Abdomen:  Soft, nontender and nondistended. Normal bowel sounds.   Neuro/Psych:  Normal mood and affect. A and O x 3   Evadene Wardrip E. Candis Schatz, MD [facility]

## 2021-10-13 NOTE — Patient Instructions (Signed)
YOU HAD AN ENDOSCOPIC PROCEDURE TODAY AT THE [facility]:   Refer to the procedure report that was given to you for any specific questions about what was found during the examination.  If the procedure report does not answer your questions, please call your gastroenterologist to clarify.  If you requested that your care partner not be given the details of your procedure findings, then the procedure report has been included in a sealed envelope for you to review at your convenience later.  YOU SHOULD EXPECT: Some feelings of bloating in the abdomen. Passage of more gas than usual.  Walking can help get rid of the air that was put into your GI tract during the procedure and reduce the bloating. If you had a lower endoscopy (such as a colonoscopy or flexible sigmoidoscopy) you may notice spotting of blood in your stool or on the toilet paper. If you underwent a bowel prep for your procedure, you may not have a normal bowel movement for a few days.  Please Note:  You might notice some irritation and congestion in your nose or some drainage.  This is from the oxygen used during your procedure.  There is no need for concern and it should clear up in a day or so.  SYMPTOMS TO REPORT IMMEDIATELY:  Following lower endoscopy (colonoscopy or flexible sigmoidoscopy):  Excessive amounts of blood in the stool  Significant tenderness or worsening of abdominal pains  Swelling of the abdomen that is new, acute  Fever of 100F or higher  Following upper endoscopy (EGD)  Vomiting of blood or coffee ground material  New chest pain or pain under the shoulder blades  Painful or persistently difficult swallowing  New shortness of breath  Fever of 100F or higher  Black, tarry-looking stools  For urgent or emergent issues, a gastroenterologist can be reached at any hour by calling [phone]. Do not use MyChart messaging for urgent concerns.    DIET:  We do recommend a small meal at first, but  then you may proceed to your regular diet.  Drink plenty of fluids but you should avoid alcoholic beverages for 24 hours.  ACTIVITY:  You should plan to take it easy for the rest of today and you should NOT DRIVE or use heavy machinery until tomorrow (because of the sedation medicines used during the test).    FOLLOW UP: Our staff will call the number listed on your records the next business day following your procedure.  We will call around 7:15- 8:00 am to check on you and address any questions or concerns that you may have regarding the information given to you following your procedure. If we do not reach you, we will leave a message.  If you develop any symptoms (ie: fever, flu-like symptoms, shortness of breath, cough etc.) before then, please call [phone].  If you test positive for Covid 19 in the 2 weeks post procedure, please call and report this information to us.    If any biopsies were taken you will be contacted by phone or by letter within the next 1-3 weeks.  Please call us at [phone] if you have not heard about the biopsies in 3 weeks.    SIGNATURES/CONFIDENTIALITY: You and/or your care partner have signed paperwork which will be entered into your electronic medical record.  These signatures attest to the fact that that the information above on your After Visit Summary has been reviewed and is understood.  Full responsibility of the confidentiality   of this discharge information lies with you and/or your care-partner.  

## 2021-10-17 ENCOUNTER — Encounter: Payer: Self-pay | Admitting: Family Medicine

## 2021-10-18 ENCOUNTER — Other Ambulatory Visit: Payer: Self-pay

## 2021-10-18 DIAGNOSIS — M48062 Spinal stenosis, lumbar region with neurogenic claudication: Secondary | ICD-10-CM

## 2021-10-18 MED ORDER — MELOXICAM 15 MG PO TABS: 15.0000 mg | ORAL_TABLET | Freq: Every day | ORAL | 3 refills | Status: DC

## 2021-10-18 NOTE — Telephone Encounter (Signed)
Call in Meloxicam 15 mg daily, #90 with 3 rf

## 2021-10-19 NOTE — Progress Notes (Signed)
Michael Garner,  The six polyps that I removed during your recent procedure were completely benign but were proven to be "pre-cancerous" polyps that MAY have grown into cancers if they had not been removed.  Studies shows that at least 20% of women over age 66 and 30% of men over age 54 have pre-cancerous polyps.  Based on current nationally recognized surveillance guidelines, I recommend that you have a repeat colonoscopy in 3 years.   If you develop any new rectal bleeding, abdominal pain or significant bowel habit changes, please contact me before then.

## 2021-11-20 ENCOUNTER — Ambulatory Visit (INDEPENDENT_AMBULATORY_CARE_PROVIDER_SITE_OTHER): Payer: No Typology Code available for payment source

## 2021-11-20 ENCOUNTER — Ambulatory Visit (INDEPENDENT_AMBULATORY_CARE_PROVIDER_SITE_OTHER): Payer: No Typology Code available for payment source | Admitting: Podiatry

## 2021-11-20 DIAGNOSIS — M778 Other enthesopathies, not elsewhere classified: Secondary | ICD-10-CM | POA: Diagnosis not present

## 2021-11-20 IMAGING — DX DG CHEST 1V PORT
2 series · 2 of 2 positions shown · non-contrast
Comparison: 05/16/2018

CLINICAL DATA: Shortness of breath

EXAM:
PORTABLE CHEST 1 VIEW

[chest ap (1 of 2)]
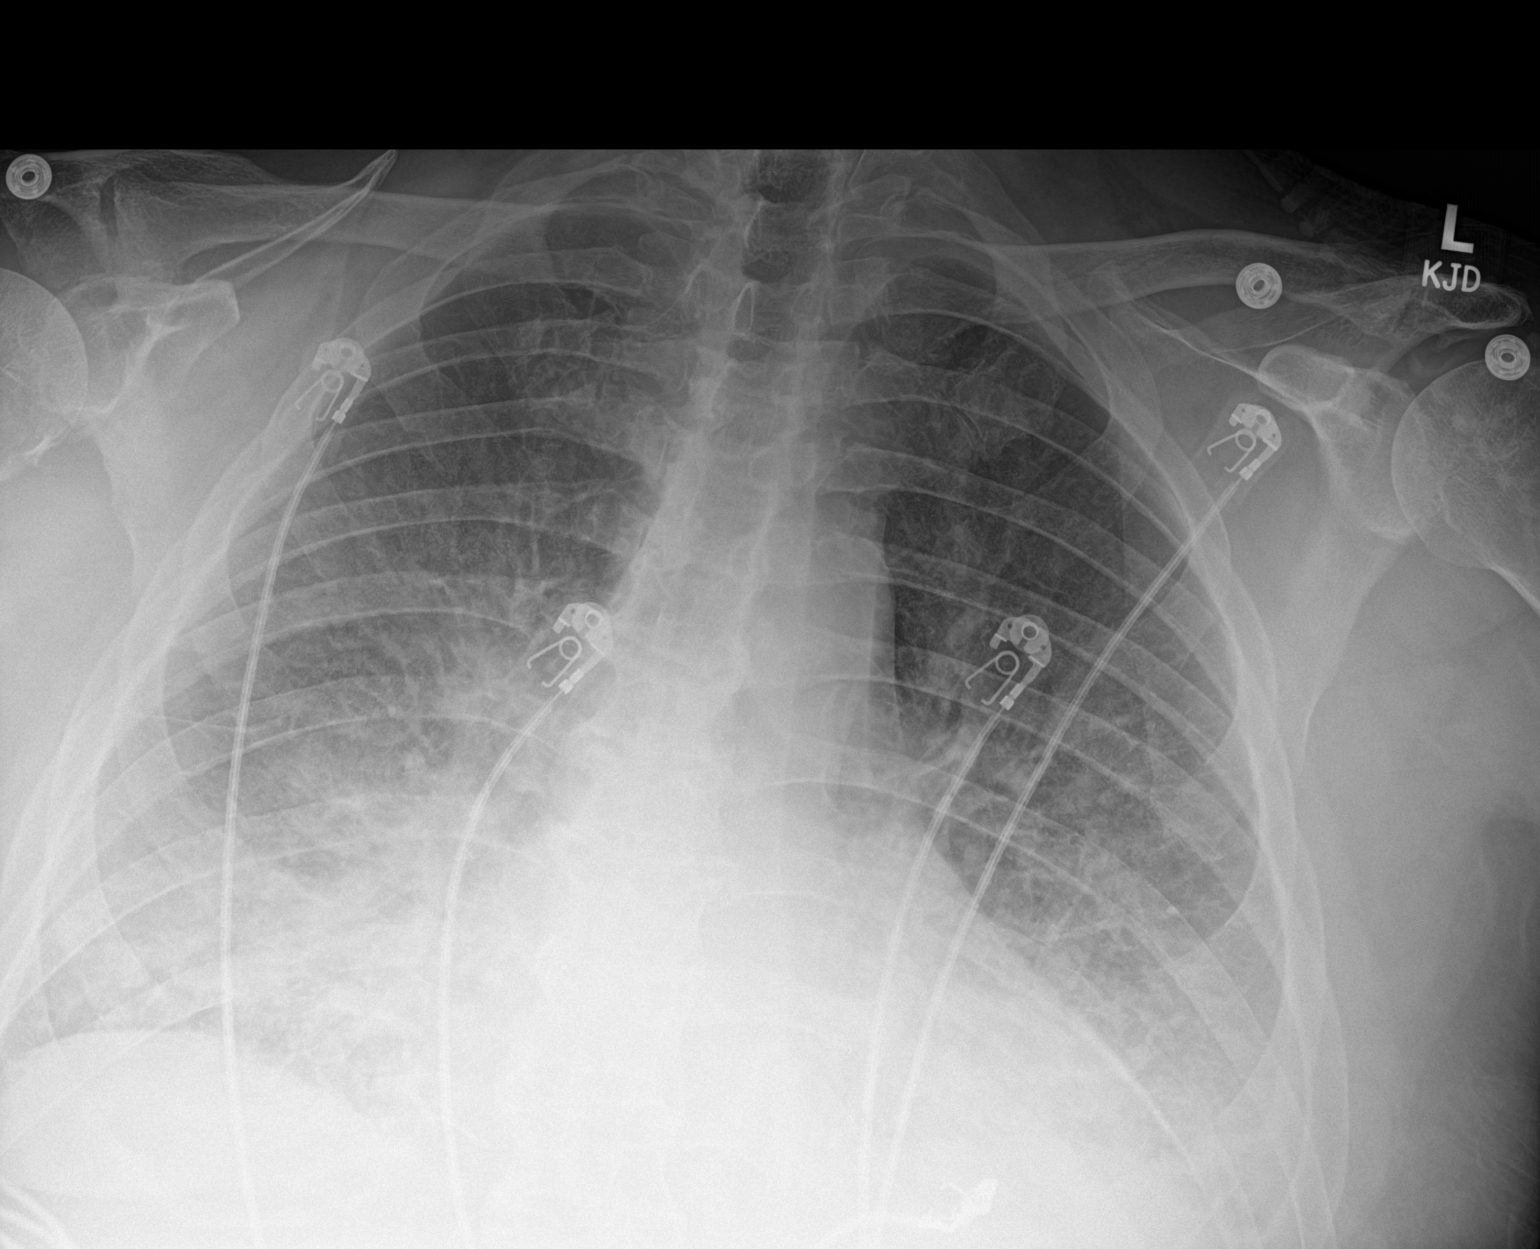

[chest ap (2 of 2)]
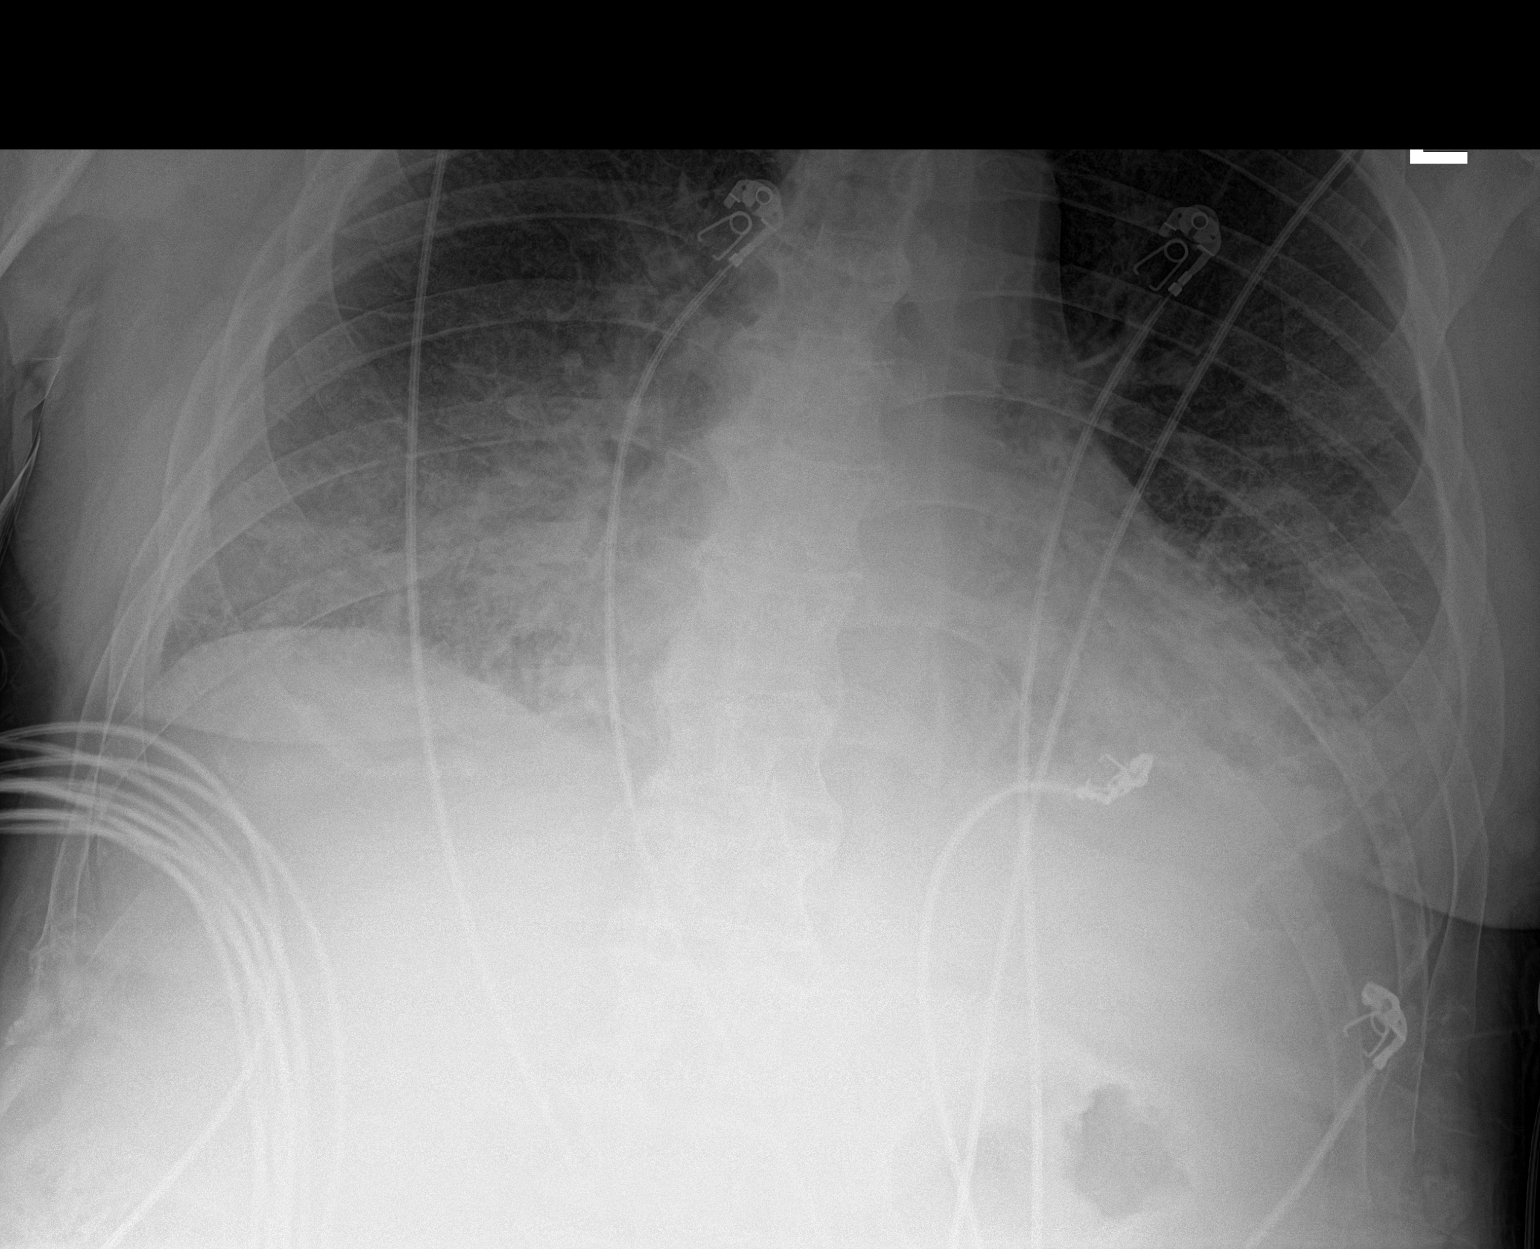

[2 of 2 positions shown; findings below may reference images not displayed]

FINDINGS: Bibasilar airspace disease is noted. The heart size is enlarged.
There are probable small to moderate-sized bilateral pleural
effusions. There is no pneumothorax. No acute osseous abnormality.
IMPRESSION: Cardiomegaly with findings suggestive of congestive heart failure
with growing bilateral pleural effusions. There is bibasilar
airspace disease favored to represent atelectasis with developing
infiltrates not excluded.

## 2021-11-20 IMAGING — CT CT ANGIO CHEST
3 of 6 series · 14 of 36 positions shown · IV contrast (omnipaque)
Comparison: Chest radiograph March 05, 2020

CLINICAL DATA: Shortness of breath.  Atrial fibrillation.

EXAM:
CT ANGIOGRAPHY CHEST WITH CONTRAST
TECHNIQUE: Multidetector CT imaging of the chest was performed using the
standard protocol during bolus administration of intravenous
contrast. Multiplanar CT image reconstructions and MIPs were
obtained to evaluate the vascular anatomy.
CONTRAST:  75mL OMNIPAQUE IOHEXOL 350 MG/ML SOLN

[Series 5: pe 2mm · axial · 0.77mm/px · z∈[+1101,+1301]mm · 5 of 151 slices shown]
[im 26/151  lung]
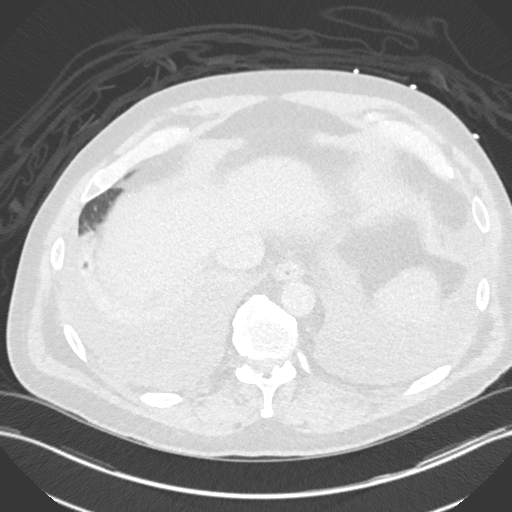
[im 51/151  mediastinal]
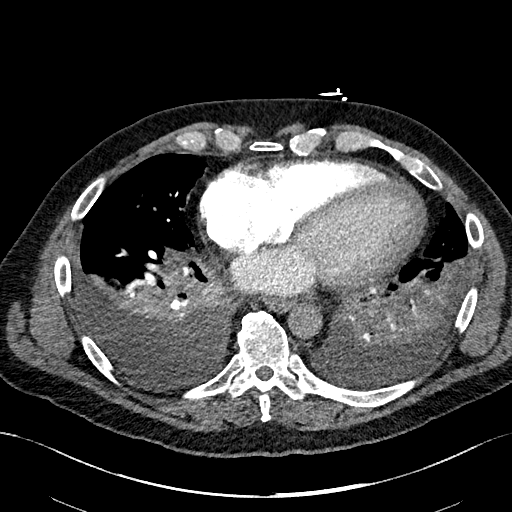
[im 76/151  lung]
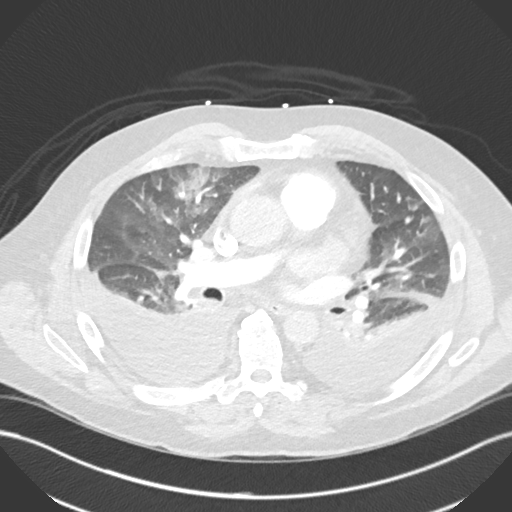
[im 101/151  mediastinal]
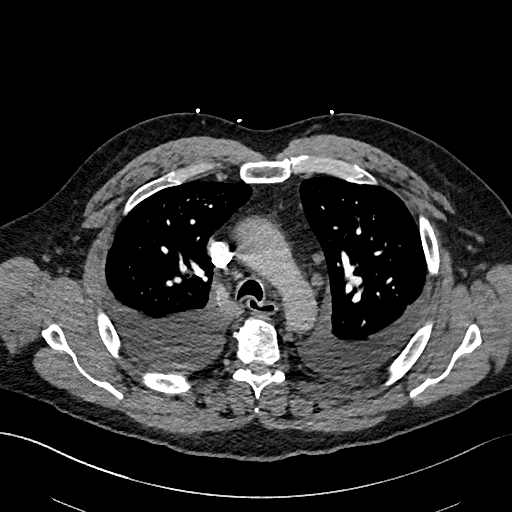
[im 126/151  lung]
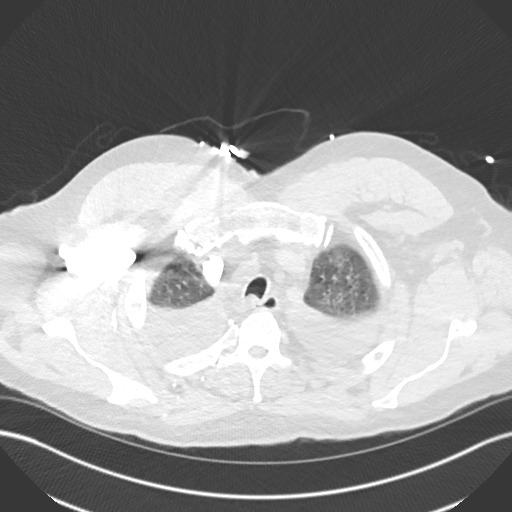

[Series 7: pe thins · axial · 0.77mm/px · z∈[+1081,+1321]mm · 8 of 429 slices shown]
[im 43/429  lung]
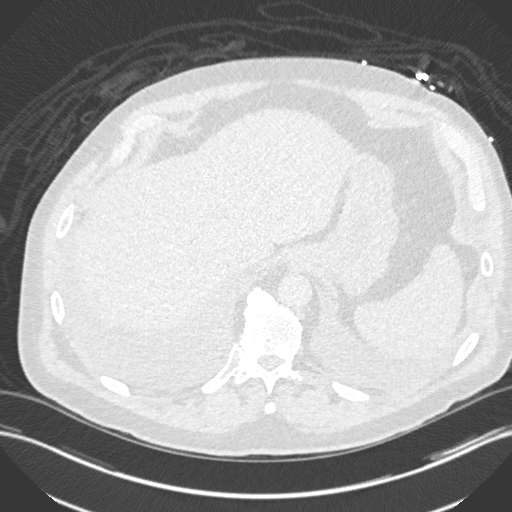
[im 86/429  lung]
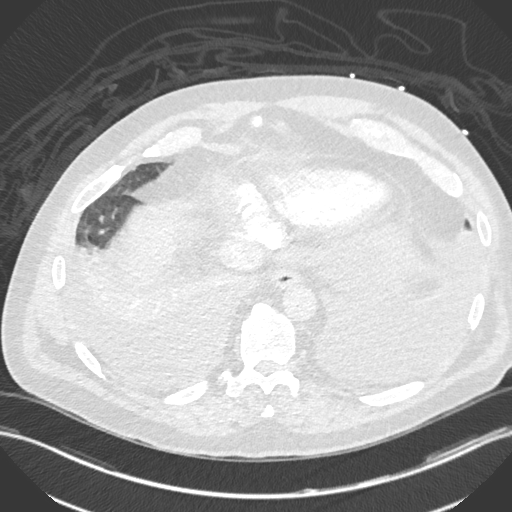
[im 129/429  lung]
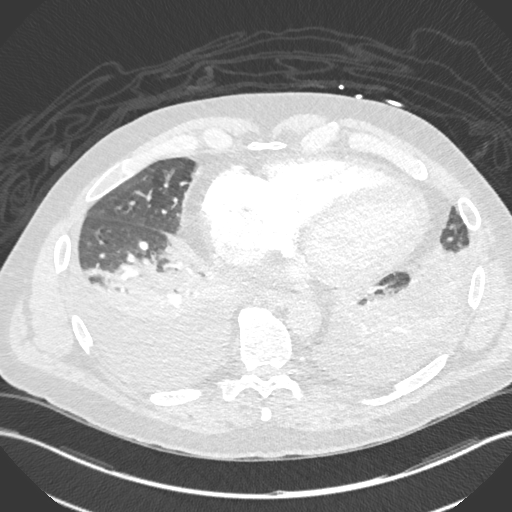
[im 193/429  lung]
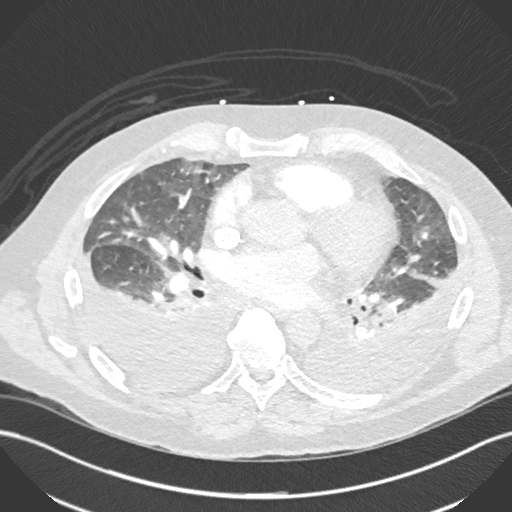
[im 236/429  lung]
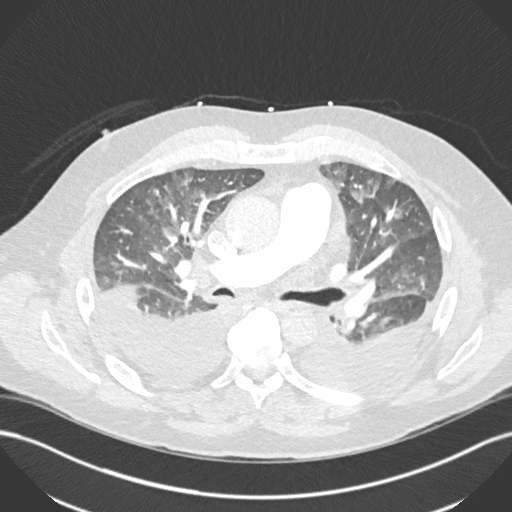
[im 300/429  lung]
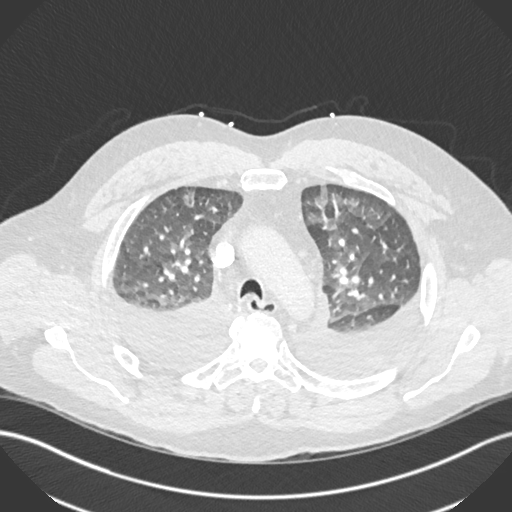
[im 343/429  lung]
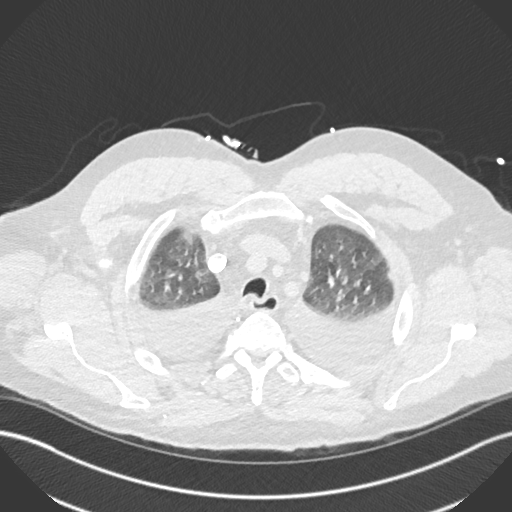
[im 386/429  lung]
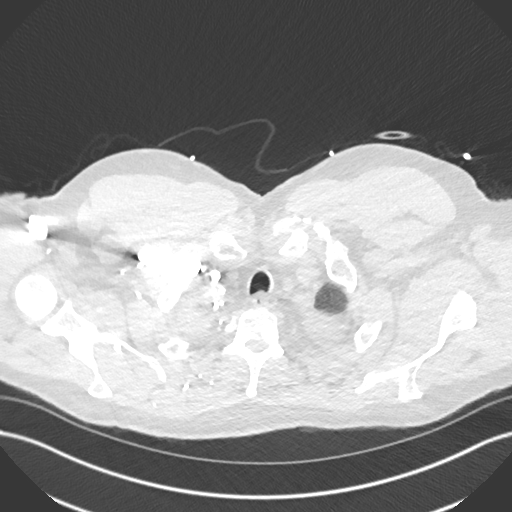

[Series 8: pe 2mm cor · coronal · 0.59mm/px · 1 of 147 slices shown]
[im 74/147  mediastinal]
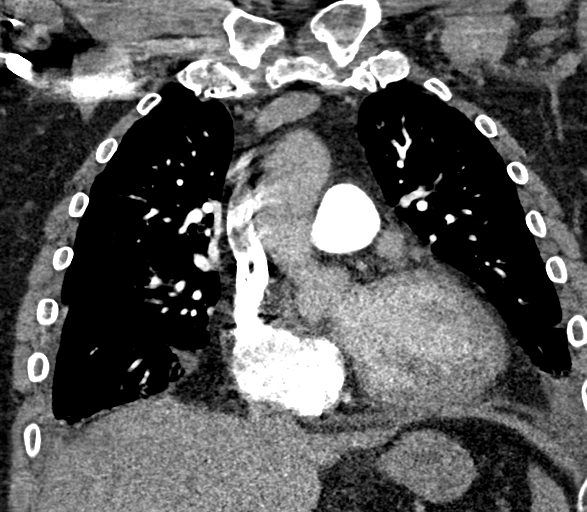

[14 of 36 positions shown; findings below may reference images not displayed]

FINDINGS: Cardiovascular: There is no demonstrable pulmonary embolus. The
ascending thoracic aortic diameter measures 4.2 x 4 2 cm. No
thoracic aortic dissection is evident. Note that the contrast bolus
in the aorta is somewhat less than optimal for dissection
assessment. The visualized great vessels appear normal. There is no
appreciable pericardial effusion or pericardial thickening. There
are scattered foci of coronary artery calcification. The main
pulmonary outflow tract diameter is 3.6 cm, prominent. Heart mildly
enlarged.

Mediastinum/Nodes: Visualized thyroid appears unremarkable. There
are scattered subcentimeter mediastinal lymph nodes. No frank
adenopathy evident by size criteria. No esophageal lesions are
appreciable.

Lungs/Pleura: Moderate free-flowing pleural effusions are noted
bilaterally. There is multifocal airspace opacity throughout the
lungs bilaterally. There is consolidation in both lower lung
regions, likely combination of pneumonia and compressive
atelectasis.

Upper Abdomen: Visualized upper abdominal structures appear
unremarkable.

Musculoskeletal: There is degenerative change in the thoracic spine
with areas of diffuse idiopathic skeletal hyperostosis. No blastic
or lytic bone lesions are evident. No chest wall lesions.

Review of the MIP images confirms the above findings.
IMPRESSION: 1.  No demonstrable pulmonary embolus.

2. Ascending thoracic aortic prominence measuring 4.2 x 4.2 cm.
Recommend annual imaging followup by CTA or MRA. This recommendation
follows 6969 ACCF/AHA/AATS/ACR/ASA/SCA/NAVIA/JIA/EBEL/ROOPAM Guidelines
for the Diagnosis and Management of Patients with Thoracic Aortic
Disease. Circulation. 6969; 121: E266-e369. Aortic aneurysm NOS
(LQPDH-0VB.L) no dissection evident. Occasional foci of coronary
artery calcification noted. Heart mildly enlarged.

3. Prominence of the main pulmonary outflow tract, a finding
indicative of a degree of pulmonary arterial hypertension.

4. Sizable free-flowing pleural effusions bilaterally. Combination
of pneumonia and compressive atelectasis in each lower lobe.
Multiple foci of airspace opacity throughout the lungs elsewhere may
represent additional foci of pneumonia. These areas have an
appearance suggesting atypical organism pneumonia. Correlation with
NUQZD-A1 status advised given this appearance. Note that a degree of
superimposed congestive heart failure cannot be excluded.

5.  No adenopathy by size criteria.

6.  Diffuse idiopathic skeletal hyperostosis in the thoracic spine.

## 2021-11-20 NOTE — Progress Notes (Signed)
   Chief Complaint  Patient presents with   Foot Pain    L ft pain on the bottom of the foot, patient states that he has had pain for 1 month.    HPI: 66 y.o. male presenting today for evaluation of a symptomatic area to the plantar aspect of the left fifth toe.  Present for few months now.  Denies a history of injury.  He does have a history of symptomatic porokeratosis/callus to the feet that have been debrided in the past.  Past Medical History:  Diagnosis Date   A-fib [facility])    Arthritis 2020   in hands   BPH with urinary obstruction    Erectile dysfunction    Hepatitis B infection    resolved    Pneumonia 2021   hospitalized   Pulmonary edema 03/05/2020    Past Surgical History:  Procedure Laterality Date   ATRIAL FIBRILLATION ABLATION N/A 03/04/2020   Procedure: ATRIAL FIBRILLATION ABLATION;  Surgeon: [name], MD;  Location: [place] [facility];  Service: Cardiovascular;  Laterality: N/A;   CARDIOVERSION  05/16/2018   COLONOSCOPY  07/18/2011   per Dr. Sharlett Iles, diverticulosis only, repeat in 10 yrs    LUMBAR LAMINECTOMY/DECOMPRESSION MICRODISCECTOMY  2018   LUMBAR LAMINECTOMY/DECOMPRESSION MICRODISCECTOMY N/A 02/06/2021   Procedure: LAMINECTOMY, FORAMINOTOMY LUMBAR TWO-THREE, LUMBAR THREE-FOUR; LEFT LUBMAR TWO-THREE DISCECTOMY;  Surgeon: Newman Pies, MD;  Location: [facility];  Service: Neurosurgery;  Laterality: N/A;  LAMINECTOMY, FORAMINOTOMY LUMBAR TWO-THREE, LUMBAR THREE-FOUR; LEFT LUBMAR TWO-THREE DISCECTOMY   LUMBAR MICRODISCECTOMY  2002    Allergies  Allergen Reactions   Dextromethorphan Other (See Comments)    Per patient "feels like I'm having a heart attack."  Tolerates plain guaifenesin.   Penicillins     Not sure but immediate family members are allergic   Prednisone Palpitations    Patient "put me in afib"     Physical Exam: General: The patient is alert and oriented x3 in no acute distress.  Dermatology: Hyperkeratotic porokeratosis  noted with a central nucleated core plantar aspect of the fifth MTP left  Vascular: Palpable pedal pulses bilaterally. Capillary refill within normal limits.  Negative for any significant edema or erythema  Neurological: Light touch and protective threshold grossly intact  Musculoskeletal Exam: No pedal deformities noted.  There is some tenderness with direct palpation of the hyperkeratotic porokeratosis to the plantar aspect of the fifth MTP left  Radiographic Exam LT foot 11/20/2021:  Normal osseous mineralization.  There is some very mild to moderate degenerative changes throughout the midtarsal joint left  Assessment: 1.  Porokeratosis fifth MTP left   Plan of Care:  1. Patient evaluated. X-Rays reviewed.  2.  Excisional debridement of the porokeratosis was performed using a 312 scalpel without incident or bleeding.  Patient did feel some relief 3.  Recommend good supportive shoes and sneakers with arch supports 4.  Return to clinic as needed  Recruitment consultant at [facility]      Edrick Kins, [facility]  Dr. Edrick Kins, DPM    [address], [place] [address]                Office [phone]  Fax [phone]

## 2021-11-21 ENCOUNTER — Encounter: Payer: Self-pay | Admitting: Podiatry

## 2021-11-22 IMAGING — DX DG CHEST 1V PORT
1 series · 1 of 1 positions shown · non-contrast
Comparison: March 05, 2020.

CLINICAL DATA: Shortness of breath, respiratory distress. Viral
pneumonia.

EXAM:
PORTABLE CHEST 1 VIEW

[chest ap]
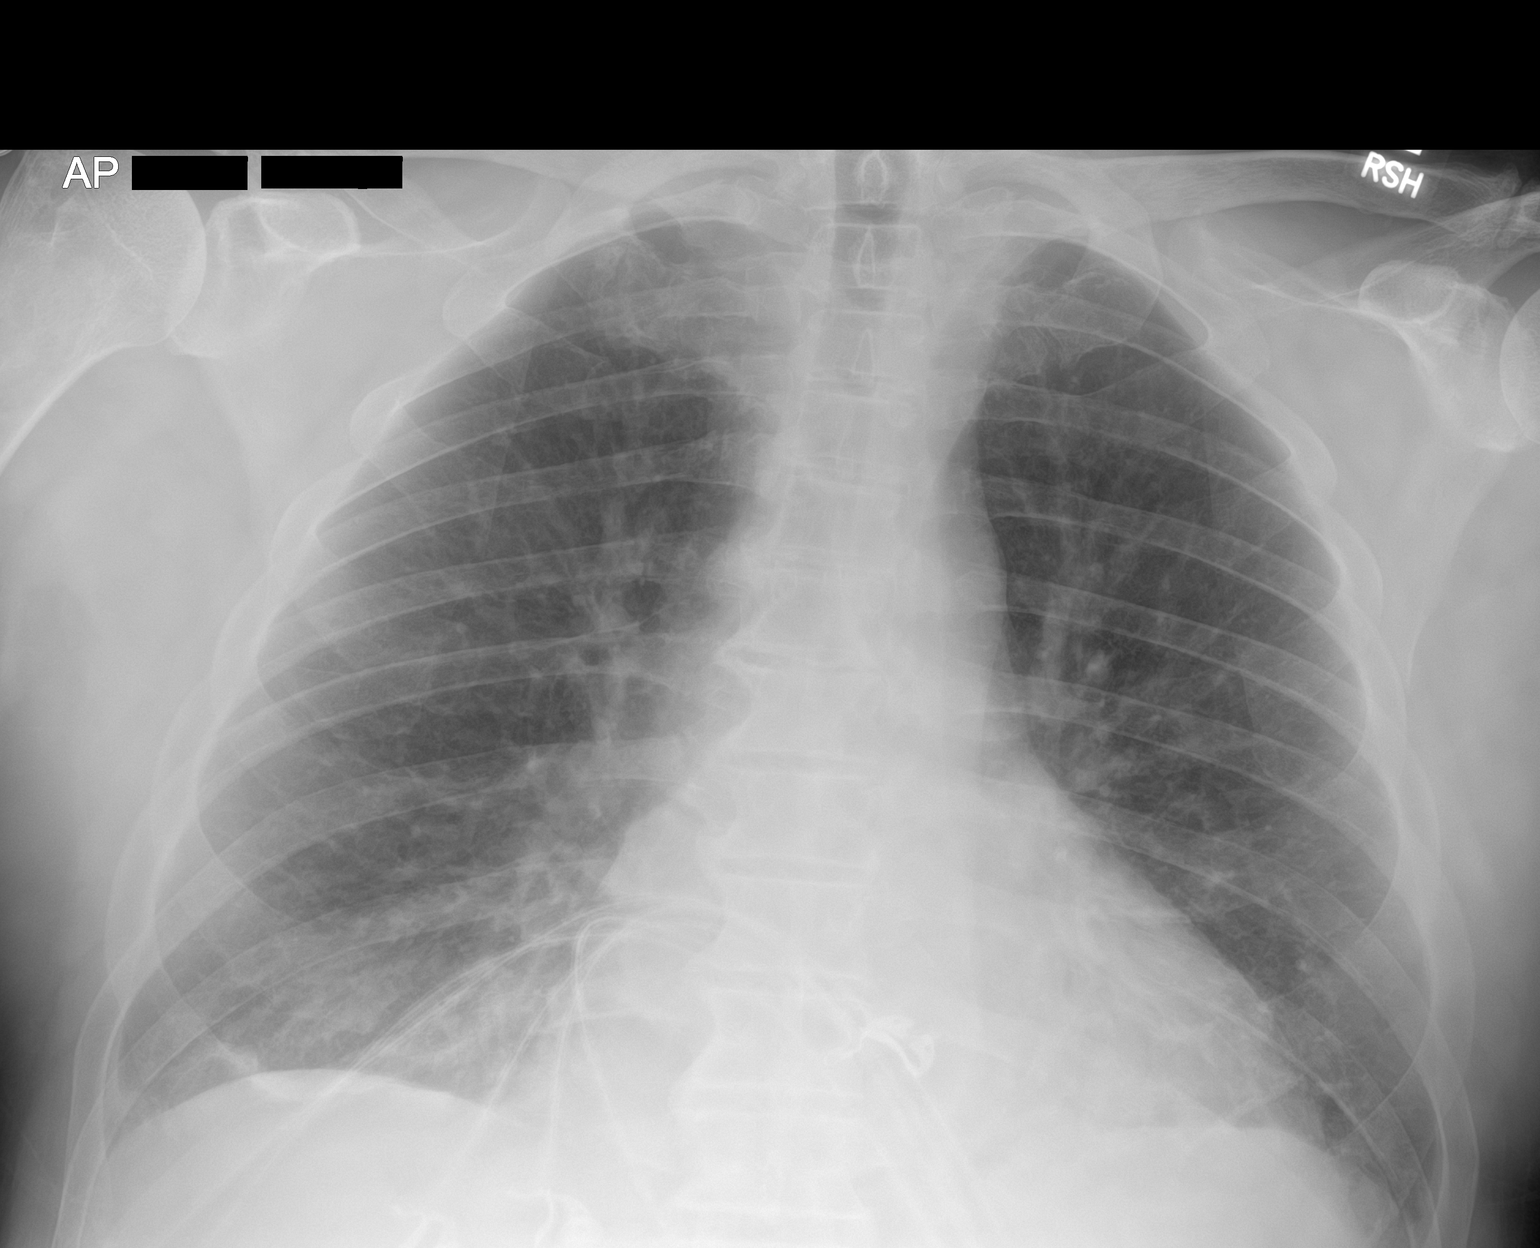

[1 of 1 positions shown; findings below may reference images not displayed]

FINDINGS: The heart size and mediastinal contours are within normal limits. In
comparison to March 05, 2020, improved diffuse interstitial and
perihilar predominant opacities, suggestive of improved pulmonary
edema. Persistent bibasilar opacities. No visible pleural effusions
or pneumothorax on this limited AP portable radiograph.
IMPRESSION: In comparison to March 05, 2020, improved diffuse interstitial
and perihilar predominant opacities, suggestive of improved
pulmonary edema. Persistent bibasilar opacities could represent
atelectasis, aspiration, and/or pneumonia.

## 2021-11-24 ENCOUNTER — Other Ambulatory Visit: Payer: Self-pay

## 2021-11-24 MED ORDER — DILTIAZEM HCL ER COATED BEADS 360 MG PO CP24: 360.0000 mg | ORAL_CAPSULE | Freq: Every day | ORAL | 0 refills | Status: DC

## 2021-11-28 ENCOUNTER — Encounter: Payer: Self-pay | Admitting: Family Medicine

## 2021-11-29 NOTE — Telephone Encounter (Signed)
I understand. Please refill her BCP for 6 months

## 2021-12-22 ENCOUNTER — Other Ambulatory Visit: Payer: Self-pay

## 2021-12-22 MED ORDER — DILTIAZEM HCL ER COATED BEADS 360 MG PO CP24: 360.0000 mg | ORAL_CAPSULE | Freq: Every day | ORAL | 0 refills | Status: DC

## 2022-01-03 DIAGNOSIS — I480 Paroxysmal atrial fibrillation: Secondary | ICD-10-CM | POA: Diagnosis not present

## 2022-01-03 DIAGNOSIS — M1990 Unspecified osteoarthritis, unspecified site: Secondary | ICD-10-CM | POA: Diagnosis not present

## 2022-01-03 DIAGNOSIS — N400 Enlarged prostate without lower urinary tract symptoms: Secondary | ICD-10-CM | POA: Diagnosis not present

## 2022-01-03 DIAGNOSIS — F17211 Nicotine dependence, cigarettes, in remission: Secondary | ICD-10-CM | POA: Diagnosis not present

## 2022-01-03 DIAGNOSIS — Z6834 Body mass index (BMI) 34.0-34.9, adult: Secondary | ICD-10-CM | POA: Diagnosis not present

## 2022-01-03 DIAGNOSIS — E669 Obesity, unspecified: Secondary | ICD-10-CM | POA: Diagnosis not present

## 2022-01-03 DIAGNOSIS — G4733 Obstructive sleep apnea (adult) (pediatric): Secondary | ICD-10-CM | POA: Diagnosis not present

## 2022-01-03 DIAGNOSIS — Z008 Encounter for other general examination: Secondary | ICD-10-CM | POA: Diagnosis not present

## 2022-01-19 ENCOUNTER — Other Ambulatory Visit: Payer: Self-pay

## 2022-01-19 MED ORDER — DILTIAZEM HCL ER COATED BEADS 360 MG PO CP24: 360.0000 mg | ORAL_CAPSULE | Freq: Every day | ORAL | 0 refills | Status: DC

## 2022-01-25 ENCOUNTER — Encounter: Payer: Self-pay | Admitting: Family Medicine

## 2022-01-26 NOTE — Telephone Encounter (Signed)
I usually refer to [facility]. I can do this if he wishes

## 2022-01-29 ENCOUNTER — Encounter: Payer: Self-pay | Admitting: Podiatry

## 2022-02-06 ENCOUNTER — Other Ambulatory Visit: Payer: Self-pay | Admitting: *Deleted

## 2022-02-08 ENCOUNTER — Encounter: Payer: Self-pay | Admitting: Cardiology

## 2022-02-09 MED ORDER — DILTIAZEM HCL ER COATED BEADS 360 MG PO CP24: 360.0000 mg | ORAL_CAPSULE | Freq: Every day | ORAL | 0 refills | Status: DC

## 2022-02-09 NOTE — Addendum Note (Signed)
Addended by: Stanton Kidney on: 02/09/2022 10:07 AM   Modules accepted: Orders

## 2022-02-09 NOTE — Telephone Encounter (Signed)
Spoke to pt. Scheduled for follow up 11/29. Will send in 30 day Rx to ensure he has enough medication on hand until can be seen in office. Pt appreciates the help with this.

## 2022-02-15 DIAGNOSIS — H16223 Keratoconjunctivitis sicca, not specified as Sjogren's, bilateral: Secondary | ICD-10-CM | POA: Diagnosis not present

## 2022-02-15 DIAGNOSIS — Z0100 Encounter for examination of eyes and vision without abnormal findings: Secondary | ICD-10-CM | POA: Diagnosis not present

## 2022-02-21 ENCOUNTER — Encounter: Payer: Self-pay | Admitting: Family Medicine

## 2022-03-07 ENCOUNTER — Ambulatory Visit: Payer: No Typology Code available for payment source | Attending: Cardiology | Admitting: Cardiology

## 2022-03-07 ENCOUNTER — Encounter: Payer: Self-pay | Admitting: Cardiology

## 2022-03-07 VITALS — BP 126/80 | HR 81 | Ht 71.0 in | Wt 250.0 lb

## 2022-03-07 DIAGNOSIS — R0602 Shortness of breath: Secondary | ICD-10-CM

## 2022-03-07 DIAGNOSIS — I4819 Other persistent atrial fibrillation: Secondary | ICD-10-CM | POA: Diagnosis not present

## 2022-03-07 NOTE — Progress Notes (Signed)
Electrophysiology Office Note   Date:  03/07/2022   ID:  Nuno Brubacher, DOB Aug 13, 1955, MRN [id number]  PCP:  Laurey Morale, MD  Cardiologist:   Primary Electrophysiologist:  Sangita Zani Meredith Leeds, MD    Chief Complaint: Atrial fibrillation   History of Present Illness: Michael Garner is a 66 y.o. male who is being seen today for the evaluation of atrial fibrillation at the request of Laurey Morale, MD. Presenting today for electrophysiology evaluation.  He has a history significant for persistent atrial fibrillation and obstructive sleep apnea.  He was diagnosed with atrial fibrillation 10/09/2018 after presenting to emergency room with palpitations.  He is status post A-fib ablation 03/04/2020.  Post ablation he was noted to have 30 rotation of COVID associated with anxiety and shortness of breath.  Was found to be RSV positive and was treated for viral pneumonia.  He was in atrial fibrillation when he was in the hospital and started on amiodarone.  Amiodarone has since been stopped.  He complains today of 2 episodes this year of feeling short of breath when he lies flat.  He says that he goes to the bathroom and when he gets back cannot lie flat without being short of breath.  He says that he goes to a chair and sometimes falls asleep in the chair but feels better within 15 minutes and can go back to the bed.  That he is doing well.  He is able to exercise without issue.  Today, denies symptoms of palpitations, chest pain, shortness of breath, orthopnea, PND, lower extremity edema, claudication, dizziness, presyncope, syncope, bleeding, or neurologic sequela. The patient is tolerating medications without difficulties.     Past Medical History:  Diagnosis Date   A-fib [facility])    Arthritis 2020   in hands   BPH with urinary obstruction    Erectile dysfunction    Hepatitis B infection    resolved    Pneumonia 2021   hospitalized   Pulmonary edema 03/05/2020   Past Surgical History:   Procedure Laterality Date   ATRIAL FIBRILLATION ABLATION N/A 03/04/2020   Procedure: ATRIAL FIBRILLATION ABLATION;  Surgeon: [name], MD;  Location: [place] [facility];  Service: Cardiovascular;  Laterality: N/A;   CARDIOVERSION  05/16/2018   COLONOSCOPY  07/18/2011   per Dr. Sharlett Iles, diverticulosis only, repeat in 10 yrs    LUMBAR LAMINECTOMY/DECOMPRESSION MICRODISCECTOMY  2018   LUMBAR LAMINECTOMY/DECOMPRESSION MICRODISCECTOMY N/A 02/06/2021   Procedure: LAMINECTOMY, FORAMINOTOMY LUMBAR TWO-THREE, LUMBAR THREE-FOUR; LEFT LUBMAR TWO-THREE DISCECTOMY;  Surgeon: Newman Pies, MD;  Location: [facility];  Service: Neurosurgery;  Laterality: N/A;  LAMINECTOMY, FORAMINOTOMY LUMBAR TWO-THREE, LUMBAR THREE-FOUR; LEFT LUBMAR TWO-THREE DISCECTOMY   LUMBAR MICRODISCECTOMY  2002     Current Outpatient Medications  Medication Sig Dispense Refill   alfuzosin (UROXATRAL) 10 MG 24 hr tablet Take 1 tablet (10 mg total) by mouth daily with breakfast. 90 tablet 3   cetirizine (ZYRTEC) 10 MG tablet Take 10 mg by mouth daily.     diltiazem (CARDIZEM CD) 360 MG 24 hr capsule Take 1 capsule (360 mg total) by mouth daily. 30 capsule 0   dutasteride (AVODART) 0.5 MG capsule Take 1 capsule (0.5 mg total) by mouth daily. 90 capsule 3   meloxicam (MOBIC) 15 MG tablet Take 1 tablet (15 mg total) by mouth daily. 90 tablet 3   mometasone (NASONEX) 50 MCG/ACT nasal spray Place 1 spray into the nose daily.     Multiple Vitamins-Minerals (MULTIVITAMIN WITH MINERALS) tablet  Take 1 tablet by mouth daily.     OVER THE COUNTER MEDICATION Take 2 capsules by mouth in the morning and at bedtime. OMEGA XL     OVER THE COUNTER MEDICATION Take 3 capsules by mouth daily. Prostagenics (Patient not taking: Reported on 09/13/2021)     tadalafil (CIALIS) 20 MG tablet Take 1 tablet (20 mg total) by mouth as needed for erectile dysfunction. 10 tablet 11   No current facility-administered medications for this visit.     Allergies:   Dextromethorphan, Penicillins, and Prednisone   Social History:  The patient  reports that he quit smoking about 21 years ago. His smoking use included cigarettes. He has never used smokeless tobacco. He reports current alcohol use of about 10.0 standard drinks of alcohol per week. He reports that he does not use drugs.   Family History:  The patient's family history includes Alcohol abuse in his father; Lung cancer in his father.   ROS:  Please see the history of present illness.   Otherwise, review of systems is positive for none.   All other systems are reviewed and negative.   PHYSICAL EXAM: VS:  BP 126/80   Pulse 81   Ht '5\' 11"'$  (1.803 m)   Wt 250 lb (113.4 kg)   SpO2 95%   BMI 34.87 kg/m  , BMI Body mass index is 34.87 kg/m. GEN: Well nourished, well developed, in no acute distress  HEENT: normal  Neck: no JVD, carotid bruits, or masses Cardiac: RRR; no murmurs, rubs, or gallops,no edema  Respiratory:  clear to auscultation bilaterally, normal work of breathing GI: soft, nontender, nondistended, + BS MS: no deformity or atrophy  Skin: warm and dry Neuro:  Strength and sensation are intact Psych: euthymic mood, full affect  EKG:  EKG is ordered today. Personal review of the ekg ordered shows sinus rhythm, rate 81  Recent Labs: 07/20/2021: ALT 36; BUN 19; Creatinine, Ser 0.80; Hemoglobin 15.7; Platelets 239.0; Potassium 4.2; Sodium 137; TSH 2.05    Lipid Panel     Component Value Date/Time   CHOL 180 07/20/2021 1046   TRIG 64.0 07/20/2021 1046   HDL 59.60 07/20/2021 1046   CHOLHDL 3 07/20/2021 1046   VLDL 12.8 07/20/2021 1046   LDLCALC 107 (H) 07/20/2021 1046   LDLDIRECT 128.0 05/08/2013 0802     Wt Readings from Last 3 Encounters:  03/07/22 250 lb (113.4 kg)  10/13/21 246 lb (111.6 kg)  09/13/21 246 lb (111.6 kg)      Other studies Reviewed: Additional studies/ records that were reviewed today include: TTE 03/05/2020 Review of the above  records today demonstrates:   1. Left ventricular ejection fraction, by estimation, is 50 to 55%. The  left ventricle has low normal function. The left ventricle has no regional  wall motion abnormalities. The left ventricular internal cavity size was  mildly dilated. There is mild  concentric left ventricular hypertrophy. Left ventricular diastolic  function could not be evaluated. There is the interventricular septum is  flattened in systole, consistent with right ventricular pressure overload.   2. Right ventricular systolic function is mildly reduced. The right  ventricular size is mildly enlarged. There is moderately elevated  pulmonary artery systolic pressure.   3. The mitral valve is normal in structure. Trivial mitral valve  regurgitation. No evidence of mitral stenosis.   4. The aortic valve is normal in structure. Aortic valve regurgitation is  not visualized. No aortic stenosis is present.   5. The inferior  vena cava is dilated in size with <50% respiratory  variability, suggesting right atrial pressure of 15 mmHg.    ASSESSMENT AND PLAN:  1.  Persistent atrial fibrillation: Currently on diltiazem 360 mg daily.  Status post ablation 03/04/2020.  CHA2DS2-VASc of 1.  He states that he would like to try being off diltiazem.  He Tynslee Bowlds be off of it for 2 weeks.  He Demetreus Lothamer let us know if he has further episodes of atrial fibrillation or if he feels well.  2.  Obstructive sleep apnea: CPAP compliance encouraged  3.  Obesity: Diet and exercise encouraged Body mass index is 34.87 kg/m.  4.  Orthopnea: This is a new finding for the patient.  He is quite concerned.  Echo in 2021 was normal with a low coronary calcium score.  We  Grosser repeat echo.   Current medicines are reviewed at length with the patient today.   The patient does not have concerns regarding his medicines.  The following changes were made today: Stop metoprolol, Eliquis  Labs/ tests ordered today include:  Orders  Placed This Encounter  Procedures   ECHOCARDIOGRAM COMPLETE     Disposition:   FU with Zoey Gilkeson 6 months  Signed, Resean Brander Meredith Leeds, MD  03/07/2022 12:51 PM     [facility] [address] [place] [address] [phone] (office) [phone] (fax) There is a

## 2022-03-07 NOTE — Patient Instructions (Signed)
Medication Instructions:  Your physician recommends that you continue on your current medications as directed. Please refer to the Current Medication list given to you today.  *If you need a refill on your cardiac medications before your next appointment, please call your pharmacy*   Lab Work: None ordered   Testing/Procedures: Your physician has requested that you have an echocardiogram. Echocardiography is a painless test that uses sound waves to create images of your heart. It provides your doctor with information about the size and shape of your heart and how well your heart's chambers and valves are working. This procedure takes approximately one hour. There are no restrictions for this procedure. Please do NOT wear cologne, perfume, aftershave, or lotions (deodorant is allowed). Please arrive 15 minutes prior to your appointment time.    Follow-Up: At [facility], you and your health needs are our priority.  As part of our continuing mission to provide you with exceptional heart care, we have created designated Provider Care Teams.  These Care Teams include your primary Cardiologist (physician) and Advanced Practice Providers (APPs -  Physician Assistants and Nurse Practitioners) who all work together to provide you with the care you need, when you need it.   Your next appointment:   1 year(s)  The format for your next appointment:   In Person  Provider:   Allegra Lai, MD    Thank you for choosing [facility]!!   Trinidad Curet, RN [phone]  Other Instructions   Important Information About Sugar

## 2022-03-09 NOTE — Telephone Encounter (Signed)
I am going to start her on Topamax every day to prevent migraines. I sent a RX to her pharmacy for the 25 mg dose. If this is not helping within 2 weeks, let me know and we can increase the dose. She can still take Rizatriptan on top of it as needed

## 2022-03-12 NOTE — Addendum Note (Signed)
Addended by: Gwendlyn Deutscher on: 03/12/2022 08:38 AM   Modules accepted: Orders

## 2022-03-15 ENCOUNTER — Encounter: Payer: Self-pay | Admitting: Family Medicine

## 2022-03-19 ENCOUNTER — Encounter: Payer: Self-pay | Admitting: Family Medicine

## 2022-03-19 ENCOUNTER — Ambulatory Visit (INDEPENDENT_AMBULATORY_CARE_PROVIDER_SITE_OTHER): Payer: No Typology Code available for payment source | Admitting: Family Medicine

## 2022-03-19 VITALS — BP 132/80 | HR 93 | Temp 97.6°F | Wt 252.0 lb

## 2022-03-19 DIAGNOSIS — J320 Chronic maxillary sinusitis: Secondary | ICD-10-CM

## 2022-03-19 DIAGNOSIS — L989 Disorder of the skin and subcutaneous tissue, unspecified: Secondary | ICD-10-CM | POA: Diagnosis not present

## 2022-03-19 DIAGNOSIS — R930 Abnormal findings on diagnostic imaging of skull and head, not elsewhere classified: Secondary | ICD-10-CM

## 2022-03-19 MED ORDER — TADALAFIL 20 MG PO TABS: 20.0000 mg | ORAL_TABLET | ORAL | 11 refills | Status: AC | PRN

## 2022-03-19 MED ORDER — DOXYCYCLINE HYCLATE 100 MG PO CAPS: 100.0000 mg | ORAL_CAPSULE | Freq: Two times a day (BID) | ORAL | 0 refills | Status: AC

## 2022-03-19 NOTE — Progress Notes (Signed)
   Subjective:    Patient ID: Michael Garner, male    DOB: Apr 21, 1955, 66 y.o.   MRN: [id number]  HPI Here for several issues. First he says a lump of skin began growing on his left temple about 6 months ago. It does not bother him but he wants it removed. Second he recently saw his dentist who performed a scan in her office which showed lesions or infections in both maxillary sinuses. She has referred him to [facility] for this. He says he has to blow his nose a lot, but denies any fever or ST or headache or cough.    Review of Systems  Constitutional: Negative.   HENT:  Positive for congestion. Negative for ear pain, facial swelling, postnasal drip, sinus pain and sore throat.   Eyes: Negative.   Respiratory: Negative.  Negative for cough.        Objective:   Physical Exam Constitutional:      Appearance: Normal appearance. He is not ill-appearing.  HENT:     Right Ear: Tympanic membrane, ear canal and external ear normal.     Left Ear: Tympanic membrane, ear canal and external ear normal.     Nose: Nose normal.     Comments: His septum is deviated to the left     Mouth/Throat:     Pharynx: Oropharynx is clear.  Eyes:     Conjunctiva/sclera: Conjunctivae normal.  Pulmonary:     Effort: Pulmonary effort is normal.     Breath sounds: Normal breath sounds.  Lymphadenopathy:     Cervical: No cervical adenopathy.  Skin:    Comments: There is a small flesh colored lesion on the left temple that is pedunculated   Neurological:     Mental Status: He is alert.           Assessment & Plan:  The skin lesion is likely to be a wart. We will refer him to Dermatology to remove this. He will be seeing the ENT soon about the CT abnormalities discovered by his dentist. In the mean time we will treat him with 10 days of Doxycycline.  Alysia Penna, MD

## 2022-03-21 ENCOUNTER — Other Ambulatory Visit: Payer: Self-pay

## 2022-03-21 MED ORDER — DILTIAZEM HCL ER COATED BEADS 360 MG PO CP24: 360.0000 mg | ORAL_CAPSULE | Freq: Every day | ORAL | 3 refills | Status: DC

## 2022-03-25 ENCOUNTER — Encounter: Payer: Self-pay | Admitting: Cardiology

## 2022-03-28 ENCOUNTER — Ambulatory Visit (HOSPITAL_COMMUNITY): Payer: No Typology Code available for payment source | Attending: Cardiovascular Disease

## 2022-03-28 DIAGNOSIS — R0602 Shortness of breath: Secondary | ICD-10-CM | POA: Diagnosis not present

## 2022-03-28 LAB — ECHOCARDIOGRAM COMPLETE
Area-P 1/2: 3.89 cm2
MV M vel: 5.19 m/s
MV Peak grad: 107.7 mmHg
S' Lateral: 3.3 cm

## 2022-03-30 NOTE — Telephone Encounter (Signed)
Pt restarting Diltiazem per Dr. Curt Bears. He will update [place] in several weeks on how he is doing.

## 2022-04-17 DIAGNOSIS — J310 Chronic rhinitis: Secondary | ICD-10-CM | POA: Diagnosis not present

## 2022-04-17 DIAGNOSIS — J342 Deviated nasal septum: Secondary | ICD-10-CM | POA: Diagnosis not present

## 2022-04-18 ENCOUNTER — Encounter: Payer: Self-pay | Admitting: Family Medicine

## 2022-04-18 NOTE — Telephone Encounter (Signed)
Yes I read the note

## 2022-04-20 NOTE — Telephone Encounter (Signed)
FYI

## 2022-04-26 DIAGNOSIS — Z0100 Encounter for examination of eyes and vision without abnormal findings: Secondary | ICD-10-CM | POA: Diagnosis not present

## 2022-05-21 ENCOUNTER — Encounter: Payer: Self-pay | Admitting: Family Medicine

## 2022-05-21 DIAGNOSIS — E291 Testicular hypofunction: Secondary | ICD-10-CM

## 2022-05-22 NOTE — Telephone Encounter (Signed)
Make an OV for [place] to discuss this

## 2022-05-24 ENCOUNTER — Encounter: Payer: Self-pay | Admitting: Family Medicine

## 2022-05-24 ENCOUNTER — Ambulatory Visit (INDEPENDENT_AMBULATORY_CARE_PROVIDER_SITE_OTHER): Payer: Medicare HMO | Admitting: Family Medicine

## 2022-05-24 VITALS — BP 126/76 | HR 62 | Temp 97.5°F | Wt 247.0 lb

## 2022-05-24 DIAGNOSIS — G4733 Obstructive sleep apnea (adult) (pediatric): Secondary | ICD-10-CM | POA: Insufficient documentation

## 2022-05-24 DIAGNOSIS — E291 Testicular hypofunction: Secondary | ICD-10-CM | POA: Diagnosis not present

## 2022-05-24 NOTE — Progress Notes (Signed)
   Subjective:    Patient ID: Michael Garner, male    DOB: [birth year], 67 y.o.   MRN: [id number]  HPI Here for several issues. First he says he has been experiencing episodes at night for the past month or two where he suddenly wakes up and feels like he cannot breathe. This causes Garner a lot of anxiety. He often tries to change sleeping positions, which may or may not help. Sometimes he ends up sleeping in a chair the rest of the night. There is no chest pain. It turns out he has sleep apnea, and he saw Dr. Fransico Garner for this several years ago. He was prescribed a CPAP machine which he used for a few months. However he did not like how the mask fit on his face, so he stopped using this over a year ago. The other issue is he joined an Chiropractor replacement program called Michael Garner about 5 months ago. To get started he was mailed a kit for Garner to prick his finger for a few drops of blood that he applied to some test cards. He mailed this back in, and he was sent a 6 month supply of Testosterone cypionate and some Enclomiphene. He has been giving himself injections of 0.4 ml of the Testosterone twice a week. He says he now feels better, he feels stronger, and his libido has improved. He has also lost a few inches off his waist size. Now he asks [place] to order more blood tests so he can send these results to the online lab. These include a total and free testosterone, CBC, free T3, estradiol, and "ultrasensitive LC/MS/MS and total MS".    Review of Systems  Constitutional: Negative.   Respiratory:  Positive for shortness of breath. Negative for cough and wheezing.   Cardiovascular: Negative.        Objective:   Physical Exam Constitutional:      Appearance: Normal appearance.  Cardiovascular:     Rate and Rhythm: Normal rate and regular rhythm.     Pulses: Normal pulses.     Heart sounds: Normal heart sounds.  Pulmonary:     Effort: Pulmonary effort is normal.     Breath sounds: Normal  breath sounds.  Neurological:     Mental Status: He is alert.           Assessment & Plan:  The episodes at night when he has trouble breathing are clearly due to untreated sleep apnea. I advised Garner to see Michael Garner asap to discuss this. As far as the testosterone replacement goes, he will get more information from the online company to clarify what tests they are asking [place] to check.  Alysia Penna, MD

## 2022-05-28 NOTE — Telephone Encounter (Signed)
I understand. I ordered all these tests, so he can schedule a lab appt

## 2022-05-30 ENCOUNTER — Other Ambulatory Visit (INDEPENDENT_AMBULATORY_CARE_PROVIDER_SITE_OTHER): Payer: Medicare HMO

## 2022-05-30 DIAGNOSIS — E291 Testicular hypofunction: Secondary | ICD-10-CM

## 2022-05-30 LAB — CBC WITH DIFFERENTIAL/PLATELET
Basophils Absolute: 0.1 10*3/uL (ref 0.0–0.1)
Basophils Relative: 1.5 % (ref 0.0–3.0)
Eosinophils Absolute: 0.2 10*3/uL (ref 0.0–0.7)
Eosinophils Relative: 4.9 % (ref 0.0–5.0)
HCT: 51.8 % (ref 39.0–52.0)
Hemoglobin: 17.5 g/dL — ABNORMAL HIGH (ref 13.0–17.0)
Lymphocytes Relative: 28.6 % (ref 12.0–46.0)
Lymphs Abs: 1.5 10*3/uL (ref 0.7–4.0)
MCHC: 33.8 g/dL (ref 30.0–36.0)
MCV: 91.2 fl (ref 78.0–100.0)
Monocytes Absolute: 0.4 10*3/uL (ref 0.1–1.0)
Monocytes Relative: 7.7 % (ref 3.0–12.0)
Neutro Abs: 2.9 10*3/uL (ref 1.4–7.7)
Neutrophils Relative %: 57.3 % (ref 43.0–77.0)
Platelets: 187 10*3/uL (ref 150.0–400.0)
RBC: 5.68 Mil/uL (ref 4.22–5.81)
RDW: 14.3 % (ref 11.5–15.5)
WBC: 5.1 10*3/uL (ref 4.0–10.5)

## 2022-05-30 LAB — T3, FREE: T3, Free: 2.5 pg/mL (ref 2.3–4.2)

## 2022-06-04 LAB — TESTOSTERONE, TOTAL, LC/MS/MS: Testosterone, Total, LC-MS-MS: 681 ng/dL (ref 250–1100)

## 2022-06-04 LAB — ESTRIOL, LC/MS/MS: Estriol: <0.1 ng/mL

## 2022-06-07 LAB — TESTOSTERONE FREE MS/DIALYSIS
Free Testosterone, Serum: 137 pg/mL
Testosterone, Serum (Total): 719 ng/dL
Testosterone-% Free: 1.9 %

## 2022-06-28 ENCOUNTER — Encounter (HOSPITAL_BASED_OUTPATIENT_CLINIC_OR_DEPARTMENT_OTHER): Payer: Self-pay | Admitting: Cardiology

## 2022-07-01 ENCOUNTER — Encounter: Payer: Self-pay | Admitting: Family Medicine

## 2022-07-02 MED ORDER — ALPRAZOLAM 0.5 MG PO TABS: 0.5000 mg | ORAL_TABLET | Freq: Two times a day (BID) | ORAL | 0 refills | Status: DC | PRN

## 2022-07-02 NOTE — Telephone Encounter (Signed)
I sent in some Xanax he can try

## 2022-07-11 ENCOUNTER — Other Ambulatory Visit: Payer: Self-pay | Admitting: Family Medicine

## 2022-07-20 DIAGNOSIS — G4733 Obstructive sleep apnea (adult) (pediatric): Secondary | ICD-10-CM | POA: Diagnosis not present

## 2022-07-20 DIAGNOSIS — J310 Chronic rhinitis: Secondary | ICD-10-CM | POA: Diagnosis not present

## 2022-07-20 DIAGNOSIS — J342 Deviated nasal septum: Secondary | ICD-10-CM | POA: Diagnosis not present

## 2022-07-30 ENCOUNTER — Ambulatory Visit (INDEPENDENT_AMBULATORY_CARE_PROVIDER_SITE_OTHER): Payer: Medicare HMO | Admitting: Family Medicine

## 2022-07-30 ENCOUNTER — Encounter: Payer: Self-pay | Admitting: Family Medicine

## 2022-07-30 VITALS — BP 110/68 | HR 66 | Temp 97.9°F | Wt 245.0 lb

## 2022-07-30 DIAGNOSIS — W57XXXA Bitten or stung by nonvenomous insect and other nonvenomous arthropods, initial encounter: Secondary | ICD-10-CM

## 2022-07-30 DIAGNOSIS — S30861A Insect bite (nonvenomous) of abdominal wall, initial encounter: Secondary | ICD-10-CM

## 2022-07-30 MED ORDER — DOXYCYCLINE HYCLATE 100 MG PO TABS: 100.0000 mg | ORAL_TABLET | Freq: Two times a day (BID) | ORAL | 0 refills | Status: DC

## 2022-07-30 NOTE — Progress Notes (Signed)
   Subjective:    Patient ID: Michael Garner, male    DOB: 02-05-1956, 67 y.o.   MRN: [id number]  HPI Here for a lesion on his left flank that he noticed 2 days ago. There is no tenderness or itching. He feels fine in general.    Review of Systems  Constitutional: Negative.   Respiratory: Negative.    Cardiovascular: Negative.        Objective:   Physical Exam Constitutional:      Appearance: Normal appearance.  Cardiovascular:     Rate and Rhythm: Normal rate and regular rhythm.     Pulses: Normal pulses.     Heart sounds: Normal heart sounds.  Pulmonary:     Effort: Pulmonary effort is normal.     Breath sounds: Normal breath sounds.  Skin:    Comments: There is a small black tick embedded in the skin on his left flank  Neurological:     Mental Status: He is alert.           Assessment & Plan:  Tick bite. We gently pulled the tick off using forceps. Cover with 7 days of Doxycycline. Recheck as needed.  Gershon Crane, MD

## 2022-08-02 ENCOUNTER — Ambulatory Visit: Payer: Medicare HMO | Admitting: Cardiology

## 2022-08-22 ENCOUNTER — Encounter: Payer: Self-pay | Admitting: Cardiology

## 2022-08-27 ENCOUNTER — Encounter: Payer: Self-pay | Admitting: Cardiology

## 2022-08-27 DIAGNOSIS — I480 Paroxysmal atrial fibrillation: Secondary | ICD-10-CM

## 2022-08-27 DIAGNOSIS — G4733 Obstructive sleep apnea (adult) (pediatric): Secondary | ICD-10-CM

## 2022-08-30 DIAGNOSIS — R69 Illness, unspecified: Secondary | ICD-10-CM | POA: Diagnosis not present

## 2022-08-31 DIAGNOSIS — G4733 Obstructive sleep apnea (adult) (pediatric): Secondary | ICD-10-CM | POA: Diagnosis not present

## 2022-09-06 ENCOUNTER — Encounter: Payer: Self-pay | Admitting: Allergy

## 2022-09-06 ENCOUNTER — Ambulatory Visit: Payer: Medicare HMO | Admitting: Allergy

## 2022-09-06 ENCOUNTER — Other Ambulatory Visit: Payer: Self-pay

## 2022-09-06 VITALS — BP 112/68 | HR 70 | Temp 98.4°F | Ht 71.0 in | Wt 228.7 lb

## 2022-09-06 DIAGNOSIS — J3489 Other specified disorders of nose and nasal sinuses: Secondary | ICD-10-CM | POA: Diagnosis not present

## 2022-09-06 DIAGNOSIS — J342 Deviated nasal septum: Secondary | ICD-10-CM | POA: Diagnosis not present

## 2022-09-06 DIAGNOSIS — J329 Chronic sinusitis, unspecified: Secondary | ICD-10-CM

## 2022-09-06 MED ORDER — FEXOFENADINE HCL 180 MG PO TABS
180.0000 mg | ORAL_TABLET | Freq: Every day | ORAL | 2 refills | Status: DC
Start: 2022-09-06 — End: 2022-12-11

## 2022-09-06 MED ORDER — XHANCE 93 MCG/ACT NA EXHU: 2.0000 | INHALANT_SUSPENSION | Freq: Two times a day (BID) | NASAL | 2 refills | Status: DC

## 2022-09-06 NOTE — Patient Instructions (Addendum)
Severe nasal congestion/obstruction - will obtain environmental allergy panel via bloodwork.  Will call you with results.  - for now continue Afrin 2 sprays each nostril for next 3-5 days.  Wait several minutes or until you can breathe more freely through the nose then go in with Xhance.  Michael Garner is nasal steroid (fluticasone - stronger strength than Flonase) use 2 sprays each nostril twice a day.  It is a special nasal spray device that allows for deeper deposition of the medication in the sinus to have better effect - continue nasal saline lavage daily - try Allegra daily.  This is an antihistamine that may be more effective than Claritin, Zyrtec, Xzyal - follow-up in ENT if non-allergic and above regime is not effective. May need rhinoscopy for visual exam of the sinus tract  Follow-up in 6-8 weeks or sooner if needed

## 2022-09-06 NOTE — Progress Notes (Signed)
New Patient Note  RE: Michael Garner MRN: [id number] DOB: 1955-12-24 Date of Office Visit: 09/06/2022   Primary care provider: Nelwyn Salisbury, MD  Chief Complaint: Nasal congestion  History of present illness: Michael Garner is a 67 y.o. male presenting today for evaluation of nasal congestion.  He was referred by ENT to determine if his nasal congestion is related to environmental allergens.  He may have a sinus surgery if he does not have allergies driving his congestion. He states he has never been allergic to anything other than poison ivy and a cleaning product until recently.   He started having sinus issues after going to dentist and having dental xray done with incidental findings of "full sinuses".  He now started to develop nasal congestion that has worsened.  Due to the appearance of his dental x-ray he was recommended to see ENT and also found he had a deviated septum.  He has seen ENT initially in January 2024.  I do not have impression or images of the dental x-ray however from the initial ENT notes it was reported to show "opaque roundish lesion in the right sinus, cotton ball like, above the edentulous area of tooth #3. The left maxillary sinus is almost completely opaque with bubbles in the lower one third."   He states his nasal congestion gets a lot worse at night.  He states when he uses afrin he can breathe.  He does use a CPAP at night and thus the Afrin helps him be able to sleep well with the CPAP.  He reports occasional sneezing and throat clearing.  Denies recurrent sinus infections.  Thus far with his current symptoms he has had 2 round of antibiotics that has not seemed to help.  He does use nasal lavage.  He has used flonase which has not helped much.He has taken generic claritin, zyrtec as well as xyzal and none of those help.     Sinus CT was done in January 2024 per report showing: Frontal: Normally aerated. Patent frontal sinus drainage pathways. Ethmoid: Mild  scattered mucosal thickening bilaterally. Maxillary: Near complete opacification of the left maxillary sinus by a large mucous retention cyst or polyp/other mass. Partially visualized periapical lucency/erosion associated with adjacent posterior left maxillary teeth. Clear right maxillary sinus. Sphenoid: Normally aerated. Patent sphenoethmoidal recesses. Right ostiomeatal unit: Patent. Left ostiomeatal unit: Patent. Nasal passages: Patent. Small bilateral middle turbinate concha bullosa. Rightward nasal septal deviation anteriorly. Anatomy: No pneumatization superior to anterior ethmoid notches. Keros II. Presellar sphenoid pneumatization pattern. No dehiscence of carotid or optic canals. No onodi cell.  He did take his antihistamine yesterday.  Review of systems: Review of Systems  Constitutional: Negative.   HENT:  Positive for congestion.   Eyes: Negative.   Respiratory: Negative.    Cardiovascular: Negative.   Musculoskeletal: Negative.   Skin: Negative.   Allergic/Immunologic: Negative.   Neurological: Negative.     All other systems negative unless noted above in HPI  Past medical history: Past Medical History:  Diagnosis Date   A-fib (HCC)    Arthritis 2020   in hands   BPH with urinary obstruction    Erectile dysfunction    Hepatitis B infection    resolved    Hypogonadism, male    Pneumonia 2021   hospitalized   Pulmonary edema 03/05/2020   Recurrent upper respiratory infection (URI)    Sleep apnea, obstructive    sees Dr. Armanda Magic    Past surgical history: Past  Surgical History:  Procedure Laterality Date   ATRIAL FIBRILLATION ABLATION N/A 03/04/2020   Procedure: ATRIAL FIBRILLATION ABLATION;  Surgeon: Regan Lemming, MD;  [facility];  Service: Cardiovascular;  Laterality: N/A;   CARDIOVERSION  05/16/2018   COLONOSCOPY  07/18/2011   per Dr. Jarold Motto, diverticulosis only, repeat in 10 yrs    LUMBAR  LAMINECTOMY/DECOMPRESSION MICRODISCECTOMY  2018   LUMBAR LAMINECTOMY/DECOMPRESSION MICRODISCECTOMY N/A 02/06/2021   Procedure: LAMINECTOMY, FORAMINOTOMY LUMBAR TWO-THREE, LUMBAR THREE-FOUR; LEFT LUBMAR TWO-THREE DISCECTOMY;  Surgeon: Tressie Stalker, MD;  Location: [facility] OR;  Service: Neurosurgery;  Laterality: N/A;  LAMINECTOMY, FORAMINOTOMY LUMBAR TWO-THREE, LUMBAR THREE-FOUR; LEFT LUBMAR TWO-THREE DISCECTOMY   LUMBAR MICRODISCECTOMY  2002    Family history:  Family History  Problem Relation Age of Onset   Allergic rhinitis Father    Alcohol abuse Father    Lung cancer Father    Allergic rhinitis Brother    Colon cancer Neg Hx    Colon polyps Neg Hx    Esophageal cancer Neg Hx    Stomach cancer Neg Hx    Rectal cancer Neg Hx     Social history: Lives in a home with carpeting with heat pump heating and cooling.  2 cats in the home.  There is no concern for water damage, mildew or roaches in the home.  He is a [employer].  He has no smoking history.   Medication List: Current Outpatient Medications  Medication Sig Dispense Refill   acetaminophen (TYLENOL) 500 MG tablet Take 500 mg by mouth in the morning and at bedtime.     alfuzosin (UROXATRAL) 10 MG 24 hr tablet TAKE ONE TABLET BY MOUTH DAILY WITH BREAKFAST 90 tablet 0   ALPRAZolam (XANAX) 0.5 MG tablet Take 1 tablet (0.5 mg total) by mouth 2 (two) times daily as needed for anxiety. 60 tablet 0   cetirizine (ZYRTEC) 10 MG tablet Take 10 mg by mouth daily.     diltiazem (CARDIZEM CD) 360 MG 24 hr capsule Take 1 capsule (360 mg total) by mouth daily. 90 capsule 3   doxycycline (VIBRA-TABS) 100 MG tablet Take 1 tablet (100 mg total) by mouth 2 (two) times daily. 14 tablet 0   dutasteride (AVODART) 0.5 MG capsule Take 1 capsule (0.5 mg total) by mouth daily. 90 capsule 3   ibuprofen (ADVIL) 400 MG tablet Take 400 mg by mouth as needed.     meloxicam (MOBIC) 15 MG tablet Take 1 tablet (15 mg total) by mouth daily. 90 tablet 3    mometasone (NASONEX) 50 MCG/ACT nasal spray Place 1 spray into the nose daily.     Multiple Vitamins-Minerals (MULTIVITAMIN WITH MINERALS) tablet Take 1 tablet by mouth daily.     tadalafil (CIALIS) 20 MG tablet Take 1 tablet (20 mg total) by mouth as needed for erectile dysfunction. 10 tablet 11   OVER THE COUNTER MEDICATION Take 2 capsules by mouth in the morning and at bedtime. OMEGA XL (Patient not taking: Reported on 09/06/2022)     No current facility-administered medications for this visit.    Known medication allergies: Allergies  Allergen Reactions   Dextromethorphan Other (See Comments)    Per patient "feels like I'm having a heart attack."  Tolerates plain guaifenesin.   Penicillins     Not sure but immediate family members are allergic   Prednisone Palpitations    Patient "put me in afib"     Physical examination: Blood pressure 112/68, pulse 70, temperature 98.4 F (36.9 C),  temperature source Temporal, height 5\' 11"  (1.803 m), weight 228 lb 11.2 oz (103.7 kg), SpO2 95 %.  General: Alert, interactive, in no acute distress. HEENT: PERRLA, TMs pearly gray, turbinates markedly edematous with complete obstruction bilaterally with clear discharge, deviated nasal septum to the right, post-pharynx non erythematous. Neck: Supple without lymphadenopathy. Lungs: Clear to auscultation without wheezing, rhonchi or rales. {no increased work of breathing. CV: Normal S1, S2 without murmurs. Abdomen: Nondistended, nontender. Skin: Warm and dry, without lesions or rashes. Extremities:  No clubbing, cyanosis or edema. Neuro:   Grossly intact.  Diagnositics/Labs:  Allergy testing: Unable to perform today due to recent antihistamine use   Assessment and plan: Severe nasal congestion/obstruction - will obtain environmental allergy panel via bloodwork.  Will call you with results.  - for now continue Afrin 2 sprays each nostril for next 3-5 days.  Wait several minutes or until you can  breathe more freely through the nose then go in with Xhance.  Timmothy Sours is nasal steroid (fluticasone - stronger strength than Flonase) use 2 sprays each nostril twice a day.  It is a special nasal spray device that allows for deeper deposition of the medication in the sinus to have better effect - continue nasal saline lavage daily - try Allegra daily.  This is an antihistamine that may be more effective than Claritin, Zyrtec, Xzyal - follow-up in ENT if non-allergic and above regime is not effective. May need rhinoscopy for visual exam of the sinus tract  Follow-up in 6-8 weeks or sooner if needed  I appreciate the opportunity to take part in Martavius's care. Please do not hesitate to contact me with questions.  Sincerely,   Margo Aye, MD Allergy/Immunology [facility]

## 2022-09-09 LAB — ALLERGENS W/TOTAL IGE AREA 2

## 2022-09-17 ENCOUNTER — Ambulatory Visit: Payer: Medicare HMO | Admitting: Family Medicine

## 2022-09-17 ENCOUNTER — Other Ambulatory Visit: Payer: Self-pay

## 2022-09-17 ENCOUNTER — Encounter: Payer: Self-pay | Admitting: Family Medicine

## 2022-09-17 VITALS — BP 120/78 | HR 67 | Temp 98.2°F | Resp 16 | Wt 225.2 lb

## 2022-09-17 DIAGNOSIS — J310 Chronic rhinitis: Secondary | ICD-10-CM

## 2022-09-17 NOTE — Progress Notes (Signed)
[address] [place] [address] Dept: [phone]  FOLLOW UP NOTE  Patient ID: Michael Garner, male    DOB: 02-29-1956  Age: 67 y.o. MRN: [id number] Date of Office Visit: 09/17/2022  Assessment  Chief Complaint: Allergy Testing  HPI Michael Garner is a 67 year old male who presents to the clinic for follow-up visit.  He was last seen in this clinic on 09/06/2022 by Dr. Delorse Lek as a new patient for evaluation of nasal congestion.  On 09/06/2022 he had lab testing that was negative to the environmental panel.  Of note, he has 2 cats in the home.  At today's visit, he reports that he is feeling well overall with no cardiopulmonary, gastrointestinal, or integumentary symptoms.  He has not had any antihistamines over the last 3 days.  His current medications are listed in the chart.  Of note, chart indicates that incidental findings of fall sinuses were found on dental x-rays and he was referred to ENT.  He visited Dr. Pollyann Kennedy on 07/20/2022 who noted, "S shaped bilateral nasal deviation with obstruction and diffuse mucosal edema without polyps or exudate".  Sinus CT was done in January 2024 per report showing: "Frontal: Normally aerated. Patent frontal sinus drainage pathways. Ethmoid: Mild scattered mucosal thickening bilaterally. Maxillary: Near complete opacification of the left maxillary sinus by a large mucous retention cyst or polyp/other mass. Partially visualized periapical lucency/erosion associated with adjacent posterior left maxillary teeth. Clear right maxillary sinus. Sphenoid: Normally aerated. Patent sphenoethmoidal recesses. Right ostiomeatal unit: Patent. Left ostiomeatal unit: Patent. Nasal passages: Patent. Small bilateral middle turbinate concha bullosa. Rightward nasal septal deviation anteriorly. Anatomy: No pneumatization superior to anterior ethmoid notches. Keros II. Presellar sphenoid pneumatization pattern. No dehiscence of carotid or optic canals. No onodi  cell".   Drug Allergies:  Allergies  Allergen Reactions   Dextromethorphan Other (See Comments)    Per patient "feels like I'm having a heart attack."  Tolerates plain guaifenesin.   Penicillins     Not sure but immediate family members are allergic   Prednisone Palpitations    Patient "put me in afib"    Physical Exam: BP 120/78   Pulse 67   Temp 98.2 F (36.8 C) (Temporal)   Resp 16   Wt 225 lb 3.2 oz (102.2 kg)   SpO2 97%   BMI 31.41 kg/m    Physical Exam Vitals reviewed.  Constitutional:      Appearance: Normal appearance.  HENT:     Head: Normocephalic and atraumatic.     Right Ear: Tympanic membrane normal.     Left Ear: Tympanic membrane normal.     Nose:     Comments: Bilateral nares grossly edematous with thin clear nasal drainage noted.  Pharynx normal.  Ears normal.  Eyes normal.    Mouth/Throat:     Pharynx: Oropharynx is clear.  Eyes:     Conjunctiva/sclera: Conjunctivae normal.  Cardiovascular:     Rate and Rhythm: Normal rate and regular rhythm.     Heart sounds: Normal heart sounds. No murmur heard. Pulmonary:     Effort: Pulmonary effort is normal.     Breath sounds: Normal breath sounds.     Comments: Lungs clear to auscultation Musculoskeletal:        General: Normal range of motion.     Cervical back: Normal range of motion and neck supple.  Skin:    General: Skin is warm and dry.  Neurological:     Mental Status: He is alert and oriented  to person, place, and time.  Psychiatric:        Mood and Affect: Mood normal.        Behavior: Behavior normal.        Thought Content: Thought content normal.        Judgment: Judgment normal.     Diagnostics: Percutaneous environmental allergy skin testing was negative to the adult environmental panel with adequate controls.   Intradermal environmental allergy skin testing was equivocal to mold mix 2 with an adequate control.   Assessment and Plan: 1. Chronic rhinitis     Patient  Instructions  Chronic rhinitis Today skin testing was equivocal to mold mix 2 Avoidance measures are listed below Continue an antihistamine once a day as needed for runny nose or itch Continue Xhance 2 sprays in each nostril twice a day for nasal congestion Consider saline nasal rinses as needed for nasal symptoms. Use this before any medicated nasal sprays for best result Continue to follow up with your ENT specialist for further evaluation of your nasal congestion  Call the clinic if this treatment plan is not working well for you.  Follow up in 1 year or sooner if needed.   Return in about 1 year (around 09/17/2023), or if symptoms worsen or fail to improve.    Thank you for the opportunity to care for this patient.  Please do not hesitate to contact me with questions.  Thermon Leyland, FNP [facility]

## 2022-09-17 NOTE — Patient Instructions (Signed)
Chronic rhinitis Today skin testing was equivocal to mold mix 2 Avoidance measures are listed below Continue an antihistamine once a day as needed for runny nose or itch Continue Xhance 2 sprays in each nostril twice a day for nasal congestion Consider saline nasal rinses as needed for nasal symptoms. Use this before any medicated nasal sprays for best result Continue to follow up with your ENT specialist for further evaluation of your nasal congestion  Call the clinic if this treatment plan is not working well for you.  Follow up in 1 year or sooner if needed.  Control of Mold Allergen Mold and fungi can grow on a variety of surfaces provided certain temperature and moisture conditions exist.  Outdoor molds grow on plants, decaying vegetation and soil.  The major outdoor mold, Alternaria and Cladosporium, are found in very high numbers during hot and dry conditions.  Generally, a late Summer - Fall peak is seen for common outdoor fungal spores.  Rain will temporarily lower outdoor mold spore count, but counts rise rapidly when the rainy period ends.  The most important indoor molds are Aspergillus and Penicillium.  Dark, humid and poorly ventilated basements are ideal sites for mold growth.  The next most common sites of mold growth are the bathroom and the kitchen.  Outdoor [employer] Use air conditioning and keep windows closed Avoid exposure to decaying vegetation. Avoid leaf raking. Avoid grain handling. Consider wearing a face mask if working in moldy areas.  Indoor Mold Control Maintain humidity below 50%. Clean washable surfaces with 5% bleach solution. Remove sources e.g. Contaminated carpets.

## 2022-09-21 ENCOUNTER — Telehealth: Payer: Self-pay

## 2022-09-21 ENCOUNTER — Encounter: Payer: Self-pay | Admitting: Cardiology

## 2022-09-21 ENCOUNTER — Ambulatory Visit: Payer: Medicare HMO | Attending: Cardiology | Admitting: Cardiology

## 2022-09-21 VITALS — BP 118/80 | HR 70 | Ht 71.0 in | Wt 225.6 lb

## 2022-09-21 DIAGNOSIS — G4733 Obstructive sleep apnea (adult) (pediatric): Secondary | ICD-10-CM | POA: Diagnosis not present

## 2022-09-21 NOTE — Patient Instructions (Addendum)
Medication Instructions:  Your physician recommends that you continue on your current medications as directed. Please refer to the Current Medication list given to you today.  *If you need a refill on your cardiac medications before your next appointment, please call your pharmacy*  Lab Work: None ordered today. If you have labs (blood work) drawn today and your tests are completely normal, you will receive your results only by: MyChart Message (if you have MyChart) OR A paper copy in the mail If you have any lab test that is abnormal or we need to change your treatment, we will call you to review the results.   Testing/Procedures: Your physician has requested that you have an Itamar sleep study performed.  Follow-Up: At [facility], you and your health needs are our priority.  As part of our continuing mission to provide you with exceptional heart care, we have created designated Provider Care Teams.  These Care Teams include your primary Cardiologist (physician) and Advanced Practice Providers (APPs -  Physician Assistants and Nurse Practitioners) who all work together to provide you with the care you need, when you need it.  Your next appointment:   Pending results of sleep study.  Provider:   Armanda Magic, MD

## 2022-09-21 NOTE — Progress Notes (Signed)
Date:  09/21/2022   ID:  Michael Garner, DOB [birth year], MRN [id number]   PCP:  Nelwyn Salisbury, MD  Cardiologist:  Jorja Loa, PA Electrophysiologist:  Regan Lemming, MD   Chief Complaint:  OSA  Sleep Medicine Consult Note:    Michael Garner is a 67 y.o. male with PAF and obesity. He is followed in afib clinic and was referred for sleep study due to afib.  He underwent home sleep study 2020 that showed severe OSA with AHI of 44.8/hr with no significant central apnea and drop in O2 sats as low as 83%.  He had significant snoring during sleep. He was started on CPAP and is now here for followup.  He is doing well with his PAP device and thinks that he has gotten used to it.  He tolerates the full face mask and feels the pressure is adequate.  For the most part he feels rested in the am but not as much as before.  He has no significant daytime sleepiness.  He denies any significant mouth or nasal dryness or nasal congestion.  He does not think that he snores.    Prior CV studies:   The following studies were reviewed today:  Home sleep study  Past Medical History:  Diagnosis Date   A-fib (HCC)    Arthritis 2020   in hands   BPH with urinary obstruction    Erectile dysfunction    Hepatitis B infection    resolved    Hypogonadism, male    Pneumonia 2021   hospitalized   Pulmonary edema 03/05/2020   Recurrent upper respiratory infection (URI)    Sleep apnea, obstructive    sees Dr. Armanda Magic   Past Surgical History:  Procedure Laterality Date   ATRIAL FIBRILLATION ABLATION N/A 03/04/2020   Procedure: ATRIAL FIBRILLATION ABLATION;  Surgeon: Regan Lemming, MD;  Location: [facility];  Service: Cardiovascular;  Laterality: N/A;   CARDIOVERSION  05/16/2018   COLONOSCOPY  07/18/2011   per Dr. Jarold Motto, diverticulosis only, repeat in 10 yrs    LUMBAR LAMINECTOMY/DECOMPRESSION MICRODISCECTOMY  2018   LUMBAR LAMINECTOMY/DECOMPRESSION MICRODISCECTOMY N/A  02/06/2021   Procedure: LAMINECTOMY, FORAMINOTOMY LUMBAR TWO-THREE, LUMBAR THREE-FOUR; LEFT LUBMAR TWO-THREE DISCECTOMY;  Surgeon: Tressie Stalker, MD;  Location: [facility] OR;  Service: Neurosurgery;  Laterality: N/A;  LAMINECTOMY, FORAMINOTOMY LUMBAR TWO-THREE, LUMBAR THREE-FOUR; LEFT LUBMAR TWO-THREE DISCECTOMY   LUMBAR MICRODISCECTOMY  2002     Current Meds  Medication Sig   acetaminophen (TYLENOL) 500 MG tablet Take 500 mg by mouth in the morning and at bedtime.   alfuzosin (UROXATRAL) 10 MG 24 hr tablet TAKE ONE TABLET BY MOUTH DAILY WITH BREAKFAST   ALPRAZolam (XANAX) 0.5 MG tablet Take 1 tablet (0.5 mg total) by mouth 2 (two) times daily as needed for anxiety.   diltiazem (CARDIZEM CD) 360 MG 24 hr capsule Take 1 capsule (360 mg total) by mouth daily.   doxycycline (VIBRA-TABS) 100 MG tablet Take 1 tablet (100 mg total) by mouth 2 (two) times daily.   dutasteride (AVODART) 0.5 MG capsule Take 1 capsule (0.5 mg total) by mouth daily.   fexofenadine (ALLEGRA) 180 MG tablet Take 1 tablet (180 mg total) by mouth daily.   ibuprofen (ADVIL) 400 MG tablet Take 400 mg by mouth as needed.   meloxicam (MOBIC) 15 MG tablet Take 1 tablet (15 mg total) by mouth daily.   mometasone (NASONEX) 50 MCG/ACT nasal spray Place 1 spray into the nose daily.   Multiple  Vitamins-Minerals (MULTIVITAMIN WITH MINERALS) tablet Take 1 tablet by mouth daily.   tadalafil (CIALIS) 20 MG tablet Take 1 tablet (20 mg total) by mouth as needed for erectile dysfunction.   XHANCE 93 MCG/ACT EXHU Place 2 sprays into the nose in the morning and at bedtime.     Allergies:   Dextromethorphan, Penicillins, and Prednisone   Social History   Tobacco Use   Smoking status: Former    Types: Cigarettes    Quit date: 2002    Years since quitting: 22.4   Smokeless tobacco: Never  Vaping Use   Vaping Use: Never used  Substance Use Topics   Alcohol use: Yes    Alcohol/week: 10.0 standard drinks of alcohol    Types: 10 Cans of beer  per week    Comment: "couple of beers after work every night"   Drug use: Yes    Types: Marijuana     Family Hx: The patient's family history includes Alcohol abuse in his father; Allergic rhinitis in his brother and father; Lung cancer in his father. There is no history of Colon cancer, Colon polyps, Esophageal cancer, Stomach cancer, or Rectal cancer.  ROS:   Please see the history of present illness.     All other systems reviewed and are negative.   Labs/Other Tests and Data Reviewed:    Recent Labs: 05/30/2022: Hemoglobin 17.5; Platelets 187.0   Recent Lipid Panel Lab Results  Component Value Date/Time   CHOL 180 07/20/2021 10:46 AM   TRIG 64.0 07/20/2021 10:46 AM   HDL 59.60 07/20/2021 10:46 AM   CHOLHDL 3 07/20/2021 10:46 AM   LDLCALC 107 (H) 07/20/2021 10:46 AM   LDLDIRECT 128.0 05/08/2013 08:02 AM    Wt Readings from Last 3 Encounters:  09/21/22 225 lb 9.6 oz (102.3 kg)  09/17/22 225 lb 3.2 oz (102.2 kg)  09/06/22 228 lb 11.2 oz (103.7 kg)     Objective:    Vital Signs:  BP 118/80   Pulse 70   Ht 5\' 11"  (1.803 m)   Wt 225 lb 9.6 oz (102.3 kg)   SpO2 98%   BMI 31.46 kg/m    GEN: Well nourished, well developed in no acute distress HEENT: Normal NECK: No JVD; No carotid bruits LYMPHATICS: No lymphadenopathy CARDIAC:RRR, no murmurs, rubs, gallops RESPIRATORY:  Clear to auscultation without rales, wheezing or rhonchi  ABDOMEN: Soft, non-tender, non-distended MUSCULOSKELETAL:  No edema; No deformity  SKIN: Warm and dry NEUROLOGIC:  Alert and oriented x 3 PSYCHIATRIC:  Normal affect  ASSESSMENT & PLAN:    1.  OSA - The patient is tolerating PAP therapy well without any problems. The PAP download performed by his DME was personally reviewed and interpreted by me today and showed an AHI of 9.1 /hr on auto CPAP from 7 to 26 cm H2O with 93 % compliance in using more than 4 hours nightly.  The patient has been using and benefiting from PAP use and will continue  to benefit from therapy.  -It looks like his mask, large leak and that is likely why his AHI is elevated as well. -He just got a new mask which he has used for a week -he is interested in the [place] device and would be a good candidate (BMI<34) -I will get an Itamar HST to document AHI of 15/hr and then refer to Dr. Jenne Pane   Patient Risk:   After full review of this patient's clinical status, I feel that they are at least moderate risk  at this time.  Medication Adjustments/Labs and Tests Ordered: Current medicines are reviewed at length with the patient today.  Concerns regarding medicines are outlined above.  Tests Ordered: No orders of the defined types were placed in this encounter.  Medication Changes: No orders of the defined types were placed in this encounter.   Disposition:  Follow up in 1 year(s)  Signed, Armanda Magic, MD  09/21/2022 1:56 PM    [facility]

## 2022-09-21 NOTE — Addendum Note (Signed)
Addended by: Franchot Gallo on: 09/21/2022 02:25 PM   Modules accepted: Orders

## 2022-09-21 NOTE — Telephone Encounter (Signed)
Patient was provided with an Itamar Sleep study ordered by Dr. Mayford Knife in clinic today. Patient is away to not open and complete the study until he has been contacted by our office with a pin number. Device has been registered.

## 2022-09-25 NOTE — Telephone Encounter (Signed)
Prior Authorization for ITAMAR sent to [employer] via web portal. Tracking Number .  READY-NO PA REQ-   

## 2022-09-26 ENCOUNTER — Encounter: Payer: Self-pay | Admitting: Family Medicine

## 2022-09-26 ENCOUNTER — Encounter: Payer: Self-pay | Admitting: Cardiology

## 2022-09-26 MED ORDER — ALPRAZOLAM 0.5 MG PO TABS: 0.5000 mg | ORAL_TABLET | Freq: Two times a day (BID) | ORAL | 2 refills | Status: DC | PRN

## 2022-09-26 NOTE — Telephone Encounter (Signed)
Done

## 2022-09-26 NOTE — Addendum Note (Signed)
Addended by: Gershon Crane A on: 09/26/2022 03:40 PM   Modules accepted: Orders

## 2022-09-26 NOTE — Telephone Encounter (Signed)
Pt LOV was on 07/30/2022 Last refill was done on 07/02/22 Please advise

## 2022-09-27 ENCOUNTER — Other Ambulatory Visit: Payer: Self-pay | Admitting: *Deleted

## 2022-09-27 ENCOUNTER — Encounter: Payer: Self-pay | Admitting: Allergy

## 2022-09-27 MED ORDER — XHANCE 93 MCG/ACT NA EXHU: 2.0000 | INHALANT_SUSPENSION | Freq: Two times a day (BID) | NASAL | 2 refills | Status: DC

## 2022-09-27 NOTE — Telephone Encounter (Signed)
Left detailed message for the pt that his Michael Garner study has been approved. Left vm with PIN# [id number] as well as asking if pt may proceed with study by this weekend. Once results are in we will call him.   Left message to call back or send my chart message to confirm that he did get my message with verbal understanding to message left.

## 2022-09-28 ENCOUNTER — Encounter (INDEPENDENT_AMBULATORY_CARE_PROVIDER_SITE_OTHER): Payer: Medicare HMO | Admitting: Cardiology

## 2022-09-28 DIAGNOSIS — G4733 Obstructive sleep apnea (adult) (pediatric): Secondary | ICD-10-CM | POA: Diagnosis not present

## 2022-09-28 NOTE — Telephone Encounter (Signed)
I think the best thing to do is let Dr. Mayford Knife review the results first and have her decide if he will need a repeat.

## 2022-10-01 ENCOUNTER — Ambulatory Visit: Payer: Medicare HMO | Attending: Cardiology

## 2022-10-01 DIAGNOSIS — G4733 Obstructive sleep apnea (adult) (pediatric): Secondary | ICD-10-CM

## 2022-10-01 NOTE — Procedures (Signed)
SLEEP STUDY REPORT Patient Information Study Date: 09/28/2022 Patient Name: Michael Garner Patient ID: [id number] Birth Date: [birth year] Age: 67 Gender: Male BMI: 31.5 (W=225 lb, H=5' 11'') Referring Physician: Armanda Magic, MD  TEST DESCRIPTION: Home sleep apnea testing was completed using the WatchPat, a Type 1 device, utilizing peripheral arterial tonometry (PAT), chest movement, actigraphy, pulse oximetry, pulse rate, body position and snore. AHI was calculated with apnea and hypopnea using valid sleep time as the denominator. RDI includes apneas, hypopneas, and RERAs. The data acquired and the scoring of sleep and all associated events were performed in accordance with the recommended standards and specifications as outlined in the AASM Manual for the Scoring of Sleep and Associated Events 2.2.0 (2015).   FINDINGS:   1. Mild Obstructive Sleep Apnea with AHI 10.5/hr.   2. No Central Sleep Apnea with pAHIc 3.3/hr.   3. Oxygen desaturations as low as 90%.   4. Mild to moderate snoring was present. O2 sats were < 88% for 0 min.   5. Total sleep time was 3 hrs and 37 min.   6. 20.8% of total sleep time was spent in REM sleep.   7.  sleep onset latency at 21 min.   8.  REM sleep onset latency at 50 min.   9. Total awakenings were 11.  10. Arrhythmia detection: None  DIAGNOSIS: Mild Obstructive Sleep Apnea (G47.33)  RECOMMENDATIONS:   1.  Clinical correlation of these findings is necessary.  The decision to treat obstructive sleep apnea (OSA) is usually based on the presence of apnea symptoms or the presence of associated medical conditions such as Hypertension, Congestive Heart Failure, Atrial Fibrillation or Obesity.  The most common symptoms of OSA are snoring, gasping for breath while sleeping, daytime sleepiness and fatigue.   2.  Initiating apnea therapy is recommended given the presence of symptoms and/or associated conditions. Recommend proceeding with one of the  following:     a.  Auto-CPAP therapy with a pressure range of 5-20cm H2O.     b.  An oral appliance (OA) that can be obtained from certain dentists with expertise in sleep medicine.  These are primarily of use in non-obese patients with mild and moderate disease.     c.  An ENT consultation which may be useful to look for specific causes of obstruction and possible treatment options.     d.  If patient is intolerant to PAP therapy, consider referral to ENT for evaluation for hypoglossal nerve stimulator.   3.  Close follow-up is necessary to ensure success with CPAP or oral appliance therapy for maximum benefit.  4.  A follow-up oximetry study on CPAP is recommended to assess the adequacy of therapy and determine the need for supplemental oxygen or the potential need for Bi-level therapy.  An arterial blood gas to determine the adequacy of baseline ventilation and oxygenation should also be considered.  5.  Healthy sleep recommendations include:  adequate nightly sleep (normal 7-9 hrs/night), avoidance of caffeine after noon and alcohol near bedtime, and maintaining a sleep environment that is cool, dark and quiet.  6.  Weight loss for overweight patients is recommended.  Even modest amounts of weight loss can significantly improve the severity of sleep apnea.  7.  Snoring recommendations include:  weight loss where appropriate, side sleeping, and avoidance of alcohol before bed.  8.  Operation of motor vehicle should be avoided when sleepy.  Signature:   Armanda Magic, MD; [facility]; Diplomat, American  Board of Sleep Medicine Electronically Signed: 10/01/2022 7:46:52 AM

## 2022-10-02 ENCOUNTER — Telehealth: Payer: Self-pay

## 2022-10-02 NOTE — Telephone Encounter (Signed)
-----   Message from Quintella Reichert, MD sent at 10/01/2022  7:48 AM EDT ----- AHI<15/hr so does not qualify for Inspire - continue CPAP

## 2022-10-02 NOTE — Telephone Encounter (Signed)
Left detailed VM, per DPR, of seep study results and recommendations. Left call back number for any questions and /or concerns.

## 2022-10-03 ENCOUNTER — Encounter: Payer: Self-pay | Admitting: Cardiology

## 2022-10-04 ENCOUNTER — Telehealth: Payer: Self-pay

## 2022-10-04 NOTE — Telephone Encounter (Signed)
Received [employer] fax - Prior Auth APPROVAL for Michael Garner   Coverage approved: 04/09/2022 - 04/09/2023

## 2022-10-04 NOTE — Telephone Encounter (Signed)
Patient Advocate Encounter   Received notification from [employer] that prior authorization is required for Michael Garner  Submitted: 10-04-2022 Key [id number]  Status is pending

## 2022-10-06 ENCOUNTER — Other Ambulatory Visit: Payer: Self-pay | Admitting: Family Medicine

## 2022-10-10 ENCOUNTER — Other Ambulatory Visit: Payer: Self-pay | Admitting: Family Medicine

## 2022-10-15 ENCOUNTER — Encounter: Payer: Self-pay | Admitting: Family Medicine

## 2022-10-15 ENCOUNTER — Encounter: Payer: Self-pay | Admitting: Cardiology

## 2022-10-15 DIAGNOSIS — G4733 Obstructive sleep apnea (adult) (pediatric): Secondary | ICD-10-CM

## 2022-10-16 NOTE — Telephone Encounter (Signed)
Has he ever tried a sleep medication? This could help him relax and possibly sleep better

## 2022-10-17 MED ORDER — TEMAZEPAM 30 MG PO CAPS: 30.0000 mg | ORAL_CAPSULE | Freq: Every evening | ORAL | 0 refills | Status: DC | PRN

## 2022-10-17 NOTE — Addendum Note (Signed)
Addended by: Reesa Chew on: 10/17/2022 03:47 PM   Modules accepted: Orders

## 2022-10-17 NOTE — Telephone Encounter (Signed)
I sent in some Temazepam for him to take at bedtime for sleep. He can still use Xanax during the day as needed

## 2022-10-23 ENCOUNTER — Telehealth: Payer: Medicare HMO | Admitting: Family Medicine

## 2022-10-23 DIAGNOSIS — Z0000 Encounter for general adult medical examination without abnormal findings: Secondary | ICD-10-CM | POA: Diagnosis not present

## 2022-10-23 NOTE — Patient Instructions (Addendum)
I really enjoyed getting to talk with you today! I am available on Tuesdays and Thursdays for virtual visits if you have any questions or concerns, or if I can be of any further assistance.   CHECKLIST FROM ANNUAL WELLNESS VISIT:  -Follow up (please call to schedule if not scheduled after visit):   -schedule inperson follow up with Dr. Clent Ridges in the next 1 month regarding weight loss and sleep apnea   -yearly for annual wellness visit with primary care office  Here is a list of your preventive care/health maintenance measures and the plan for each if any are due:  PLAN For any measures below that may be due:  -discuss lung cancer screening with Dr. Clent Ridges when you see him  Health Maintenance  Topic Date Due   Lung Cancer Screening  03/05/2021   COVID-19 Vaccine (6 - 2023-24 season) 05/05/2022   INFLUENZA VACCINE  11/08/2022   Medicare Annual Wellness (AWV)  10/23/2023   Colonoscopy  10/13/2024   DTaP/Tdap/Td (2 - Td or Tdap) 01/02/2028   Pneumonia Vaccine 32+ Years old  Completed   Hepatitis C Screening  Completed   Zoster Vaccines- Shingrix  Completed   HPV VACCINES  Aged Out    -See a dentist at least yearly  -Get your eyes checked and then per your eye specialist's recommendations  -Other issues addressed today:   -I have included below further information regarding a healthy whole foods based diet, physical activity guidelines for adults, stress management and opportunities for social connections. I hope you find this information useful.   -----------------------------------------------------------------------------------------------------------------------------------------------------------------------------------------------------------------------------------------------------------  NUTRITION: -eat real food: lots of colorful vegetables (half the plate) and fruits -5-7 servings of vegetables and fruits per day (fresh or steamed is best), exp. 2 servings of vegetables with  lunch and dinner and 2 servings of fruit per day. Berries and greens such as kale and collards are great choices.  -consume on a regular basis: whole grains (make sure first ingredient on label contains the word "whole"), fresh fruits, fish, nuts, seeds, healthy oils (such as olive oil, avocado oil, grape seed oil) -may eat small amounts of dairy and lean meat on occasion, but avoid processed meats such as ham, bacon, lunch meat, etc. -drink water -try to avoid fast food and pre-packaged foods, processed meat -most experts advise limiting sodium to < 2300mg  per day, should limit further is any chronic conditions such as high blood pressure, heart disease, diabetes, etc. The American Heart Association advised that < 1500mg  is is ideal -try to avoid foods that contain any ingredients with names you do not recognize  -try to avoid sugar/sweets (except for the natural sugar that occurs in fresh fruit) -try to avoid sweet drinks -try to avoid white rice, white bread, pasta (unless whole grain), white or yellow potatoes  EXERCISE GUIDELINES FOR ADULTS: -if you wish to increase your physical activity, do so gradually and with the approval of your doctor -STOP and seek medical care immediately if you have any chest pain, chest discomfort or trouble breathing when starting or increasing exercise  -move and stretch your body, legs, feet and arms when sitting for long periods -Physical activity guidelines for optimal health in adults: -least 150 minutes per week of aerobic exercise (can talk, but not sing) once approved by your doctor, 20-30 minutes of sustained activity or two 10 minute episodes of sustained activity every day.  -resistance training at least 2 days per week if approved by your doctor -balance exercises 3+ days per  week:   Stand somewhere where you have something sturdy to hold onto if you lose balance.    1) lift up on toes, start with 5x per day and work up to 20x   2) stand and lift on  leg straight out to the side so that foot is a few inches of the floor, start with 5x each side and work up to 20x each side   3) stand on one foot, start with 5 seconds each side and work up to 20 seconds on each side  If you need ideas or help with getting more active:  -Silver sneakers https://tools.silversneakers.com  -Walk with a Doc: http://www.duncan-williams.com/  -try to include resistance (weight lifting/strength building) and balance exercises twice per week: or the following link for ideas: http://castillo-powell.com/  BuyDucts.dk  STRESS MANAGEMENT: -can try meditating, or just sitting quietly with deep breathing while intentionally relaxing all parts of your body for 5 minutes daily -if you need further help with stress, anxiety or depression please follow up with your primary doctor or contact the wonderful folks at [facility]: [phone]  SOCIAL CONNECTIONS: -options in [place] if you wish to engage in more social and exercise related activities:  -Silver sneakers https://tools.silversneakers.com  -Walk with a Doc: http://www.duncan-williams.com/  -Check out the [facility] Active Adults 50+ section on the [place] of [employer] (hiking clubs, book clubs, cards and games, chess, exercise classes, aquatic classes and much more) - see the website for details: https://www.[place].gov/departments/parks-recreation/active-adults50  -YouTube has lots of exercise videos for different ages and abilities as well  -[facility] (a variety of indoor and outdoor inperson activities for adults). [phone]. [address]  -Virtual Online Classes (a variety of topics): see seniorplanet.org or call [phone]  -consider volunteering at a school, hospice center, church, senior center or elsewhere

## 2022-10-23 NOTE — Progress Notes (Signed)
PATIENT CHECK-IN and HEALTH RISK ASSESSMENT QUESTIONNAIRE:  -completed by phone/video for upcoming Medicare Preventive Visit  Pre-Visit Check-in: 1)Vitals (height, wt, BP, etc) - record in vitals section for visit on day of visit 2)Review and Update Medications, Allergies PMH, Surgeries, Social history in Epic 3)Hospitalizations in the last year with date/reason?  No   4)Review and Update Care Team (patient's specialists) in Epic 5) Complete PHQ9 in Epic  6) Complete Fall Screening in Epic 7)Review all Health Maintenance Due and order under PCP if not done.  Medicare Wellness Patient Questionnaire:  Answer theses question about your habits: Do you drink alcohol? Yes  If yes, how many drinks do you have a day?a beer or glass of wine  after  Have you ever smoked?yes - quit over 10 years ago How many packs a day do/did you smoke? Pack /day  Do you use smokeless tobacco? no  Do you use an illicit drugs? MJ Do you exercises? Yes IF so, what type and how many days/minutes per week?tries for 1 + mile before work, 3-4 times a wk. Does sit ups and push ups and curls.  Are you sexually active?  No  Typical breakfast: cantaloupe and raisin bran crunch Typical lunch varies: salad to salmon to chicken sandwich  Typical dinner varies, if goes out will get a steak  Typical snacks: scoop of ice cream   Beverages: water, strawberry lemonade  Answer theses question about you: Can you perform most household chores? yes  Do you find it hard to follow a conversation in a noisy room? No  Do you often ask people to speak up or repeat themselves?no  Do you feel that you have a problem with memory? sometimes- remembering names Do you balance your checkbook and or bank acounts? No, online banking  Do you feel safe at home? Yes  Last dentist visit?month or 2  Do you need assistance with any of the following: Please note if so no   Driving?  Feeding yourself?  Getting from bed to chair?  Getting to the  toilet?  Bathing or showering?  Dressing yourself?  Managing money?  Climbing a flight of stairs  Preparing meals?    Do you have Advanced Directives in place (Living Will, Healthcare Power or Attorney)? No    Last eye Exam and location? 3-4 months ago    Do you currently use prescribed or non-prescribed narcotic or opioid pain medications?no   Do you have a history or close family history of breast, ovarian, tubal or peritoneal cancer or a family member with BRCA (breast cancer susceptibility 1 and 2) gene mutations? No,  mother died of lung cancer   Nurse/Assistant Credentials/time stamp: Tora Perches CMA 12:36 pm   "Per patient no change in vitals since last visit, unable to obtain new vitals due to telehealth visit".    ----------------------------------------------------------------------------------------------------------------------------------------------------------------------------------------------------------------------    MEDICARE ANNUAL PREVENTIVE CARE VISIT WITH PROVIDER (Welcome to Medicare, initial annual wellness or annual wellness exam)  Virtual Visit via Video Note  I connected with Michaelangelo Mittelman on 10/23/22  By a video enabled telemedicine application and verified that I am speaking with the correct person using two identifiers.  Location patient: home Location provider:work or home office Persons participating in the virtual visit: patient, provider  Concerns and/or follow up today: doing ok, on his feet all the time at work and the last few days had a little hip discomfort - now doing better with stretching, no weakness, numbness, giving away. Thinks was from  walking a lot lately. Eating healthy. Has lost about 30 lbs on testosterone and hcg through Theron Arista MD online. Feels great but isnt sure if the wt loss is intentional or not. Reports PCP aware of medications he is taking. Has not told PCP about wt loss. Also had recheck of his sleep apnea and now doesn't  qualify for cpap. He is not sure this is accurate - but was after wt loss. Wants to talk with PCP. He would like to not have to use cpap as causes stress and sleep issues.    See HM section in Epic for other details of completed HM.    ROS: negative for report of fevers, unintentional weight loss, vision changes, vision loss, hearing loss or change, chest pain, sob, hemoptysis, melena, hematochezia, hematuria, falls, bleeding or bruising, thoughts of suicide or self harm, memory loss  Patient-completed extensive health risk assessment - reviewed and discussed with the patient: See Health Risk Assessment completed with patient prior to the visit either above or in recent phone note. This was reviewed in detailed with the patient today and appropriate recommendations, orders and referrals were placed as needed per Summary below and patient instructions.   Review of Medical History: -PMH, PSH, Family History and current specialty and care providers reviewed and updated and listed below   Patient Care Team: Nelwyn Salisbury, MD as PCP - General (Family Medicine) Quintella Reichert, MD as PCP - Sleep Medicine (Cardiology) Regan Lemming, MD as PCP - Electrophysiology (Cardiology)   Past Medical History:  Diagnosis Date   A-fib [facility])    Arthritis 2020   in hands   BPH with urinary obstruction    Erectile dysfunction    Hepatitis B infection    resolved    Hypogonadism, male    Pneumonia 2021   hospitalized   Pulmonary edema 03/05/2020   Recurrent upper respiratory infection (URI)    Sleep apnea, obstructive    sees Dr. Armanda Magic    Past Surgical History:  Procedure Laterality Date   ATRIAL FIBRILLATION ABLATION N/A 03/04/2020   Procedure: ATRIAL FIBRILLATION ABLATION;  Surgeon: Regan Lemming, MD;  Location: [facility];  Service: Cardiovascular;  Laterality: N/A;   CARDIOVERSION  05/16/2018   COLONOSCOPY  07/18/2011   per Dr. Jarold Motto, diverticulosis only,  repeat in 10 yrs    LUMBAR LAMINECTOMY/DECOMPRESSION MICRODISCECTOMY  2018   LUMBAR LAMINECTOMY/DECOMPRESSION MICRODISCECTOMY N/A 02/06/2021   Procedure: LAMINECTOMY, FORAMINOTOMY LUMBAR TWO-THREE, LUMBAR THREE-FOUR; LEFT LUBMAR TWO-THREE DISCECTOMY;  Surgeon: Tressie Stalker, MD;  Location: [facility] OR;  Service: Neurosurgery;  Laterality: N/A;  LAMINECTOMY, FORAMINOTOMY LUMBAR TWO-THREE, LUMBAR THREE-FOUR; LEFT LUBMAR TWO-THREE DISCECTOMY   LUMBAR MICRODISCECTOMY  2002    Social History   Socioeconomic History   Marital status: Divorced    Spouse name: Not on file   Number of children: Not on file   Years of education: Not on file   Highest education level: Not on file  Occupational History   Not on file  Tobacco Use   Smoking status: Former    Current packs/day: 0.00    Types: Cigarettes    Quit date: 2002    Years since quitting: 22.5   Smokeless tobacco: Never  Vaping Use   Vaping status: Never Used  Substance and Sexual Activity   Alcohol use: Yes    Alcohol/week: 10.0 standard drinks of alcohol    Types: 10 Cans of beer per week    Comment: "couple of beers after work  every night"   Drug use: Yes    Types: Marijuana   Sexual activity: Not on file  Other Topics Concern   Not on file  Social History Narrative   Not on file   Social Determinants of Health   Financial Resource Strain: Low Risk  (08/29/2021)   Overall Financial Resource Strain (CARDIA)    Difficulty of Paying Living Expenses: Not hard at all  Food Insecurity: Low Risk  (07/20/2022)   [facility]   Food vital sign    Within the past 12 months, you worried that your food would run out before you got money to buy more: Never true    Within the past 12 months, the food you bought just didn't last and you didn't have money to get more. : Never true  Transportation Needs: Not on file (07/20/2022)  Physical Activity: Sufficiently Active (08/29/2021)   Exercise Vital Sign    Days of  Exercise per Week: 5 days    Minutes of Exercise per Session: 50 min  Stress: No Stress Concern Present (08/29/2021)   [employer] of Occupational Health - Occupational Stress Questionnaire    Feeling of Stress : Not at all  Social Connections: Unknown (03/19/2022)   Social Connection and Isolation Panel [NHANES]    Frequency of Communication with Friends and Family: More than three times a week    Frequency of Social Gatherings with Friends and Family: Not on file    Attends Religious Services: Not on file    Active Member of Clubs or Organizations: No    Attends Banker Meetings: Not on file    Marital Status: Not on file  Recent Concern: Social Connections - Socially Isolated (03/19/2022)   Social Connection and Isolation Panel [NHANES]    Frequency of Communication with Friends and Family: More than three times a week    Frequency of Social Gatherings with Friends and Family: More than three times a week    Attends Religious Services: Never    Database administrator or Organizations: No    Attends Banker Meetings: Never    Marital Status: Divorced  Catering manager Violence: Not At Risk (08/29/2021)   Humiliation, Afraid, Rape, and Kick questionnaire    Fear of Current or Ex-Partner: No    Emotionally Abused: No    Physically Abused: No    Sexually Abused: No    Family History  Problem Relation Age of Onset   Allergic rhinitis Father    Alcohol abuse Father    Lung cancer Father    Allergic rhinitis Brother    Colon cancer Neg Hx    Colon polyps Neg Hx    Esophageal cancer Neg Hx    Stomach cancer Neg Hx    Rectal cancer Neg Hx     Current Outpatient Medications on File Prior to Visit  Medication Sig Dispense Refill   acetaminophen (TYLENOL) 500 MG tablet Take 500 mg by mouth in the morning and at bedtime.     alfuzosin (UROXATRAL) 10 MG 24 hr tablet TAKE 1 TABLET BY MOUTH DAILY WITH BREAKFAST 90 tablet 0   ALPRAZolam (XANAX) 0.5 MG  tablet Take 1 tablet (0.5 mg total) by mouth 2 (two) times daily as needed for anxiety. 60 tablet 2   diltiazem (CARDIZEM CD) 360 MG 24 hr capsule Take 1 capsule (360 mg total) by mouth daily. 90 capsule 3   doxycycline (VIBRA-TABS) 100 MG tablet Take 1 tablet (  100 mg total) by mouth 2 (two) times daily. 14 tablet 0   dutasteride (AVODART) 0.5 MG capsule TAKE ONE CAPSULE BY MOUTH DAILY 90 capsule 3   fexofenadine (ALLEGRA) 180 MG tablet Take 1 tablet (180 mg total) by mouth daily. 30 tablet 2   ibuprofen (ADVIL) 400 MG tablet Take 400 mg by mouth as needed.     meloxicam (MOBIC) 15 MG tablet Take 1 tablet (15 mg total) by mouth daily. 90 tablet 3   mometasone (NASONEX) 50 MCG/ACT nasal spray Place 1 spray into the nose daily.     Multiple Vitamins-Minerals (MULTIVITAMIN WITH MINERALS) tablet Take 1 tablet by mouth daily.     tadalafil (CIALIS) 20 MG tablet Take 1 tablet (20 mg total) by mouth as needed for erectile dysfunction. 10 tablet 11   temazepam (RESTORIL) 30 MG capsule Take 1 capsule (30 mg total) by mouth at bedtime as needed for sleep. 30 capsule 0   XHANCE 93 MCG/ACT EXHU Place 2 sprays into the nose in the morning and at bedtime. 16 mL 2   No current facility-administered medications on file prior to visit.    Allergies  Allergen Reactions   Dextromethorphan Other (See Comments)    Per patient "feels like I'm having a heart attack."  Tolerates plain guaifenesin.   Penicillins     Not sure but immediate family members are allergic   Prednisone Palpitations    Patient "put me in afib"       Physical Exam Vitals requested from patient and listed below if patient had equipment and was able to obtain at home for this virtual visit: There were no vitals filed for this visit. Estimated body mass index is 31.46 kg/m as calculated from the following:   Height as of 09/21/22: 5\' 11"  (1.803 m).   Weight as of 09/21/22: 225 lb 9.6 oz (102.3 kg).  EKG (optional): deferred due to  virtual visit  GENERAL: alert, oriented, no acute distress detected; full vision exam deferred due to pandemic and/or virtual encounter  HEENT: atraumatic, conjunttiva clear, no obvious abnormalities on inspection of external nose and ears  NECK: normal movements of the head and neck  LUNGS: on inspection no signs of respiratory distress, breathing rate appears normal, no obvious gross SOB, gasping or wheezing  CV: no obvious cyanosis  MS: moves all visible extremities without noticeable abnormality  PSYCH/NEURO: pleasant and cooperative, no obvious depression or anxiety, speech and thought processing grossly intact, Cognitive function grossly intact  Flowsheet Row Office Visit from [facility]/30/2022 [facility]  PHQ-9 Total Score 2           07/30/2022   11:03 AM 05/24/2022   11:22 AM 08/29/2021    9:52 AM 07/20/2021   10:38 AM 06/28/2021    3:17 PM  Depression screen PHQ 2/9  Decreased Interest 0 0 0 0 1  Down, Depressed, Hopeless 0 0 0 0 0  PHQ - 2 Score 0 0 0 0 1  Altered sleeping 0 0   1  Tired, decreased energy 1 0   1  Change in appetite 1 0   0  Feeling bad or failure about yourself  0 0   1  Trouble concentrating 0 0   0  Moving slowly or fidgety/restless 0 0   0  Suicidal thoughts 0 0   0  PHQ-9 Score 2 0   4  Difficult doing work/chores  Not difficult at all   Not  difficult at all       07/20/2021   10:38 AM 08/29/2021    9:55 AM 03/19/2022   11:01 AM 05/24/2022   11:22 AM 07/30/2022   11:03 AM  Fall Risk  Falls in the past year? 0 0 0 0 0  Was there an injury with Fall? 0 0  0 0  Fall Risk Category Calculator 0 0  0 0  Fall Risk Category (Retired) Low Low     (RETIRED) Patient Fall Risk Level Low fall risk Low fall risk     Patient at Risk for Falls Due to No Fall Risks No Fall Risks  No Fall Risks No Fall Risks  Fall risk Follow up Falls evaluation completed   Falls evaluation completed Falls evaluation completed      SUMMARY AND PLAN:  Encounter for Medicare annual wellness exam   Discussed applicable health maintenance/preventive health measures and advised and referred or ordered per patient preferences: -he plans to see PCP for follow up and wants to discuss lung cancer screening with him -had PSA last year with PCP -reports had covid booster in the fall  Health Maintenance  Topic Date Due   Lung Cancer Screening  03/05/2021   COVID-19 Vaccine (6 - 2023-24 season) 05/05/2022   INFLUENZA VACCINE  11/08/2022   Medicare Annual Wellness (AWV)  10/23/2023   Colonoscopy  10/13/2024   DTaP/Tdap/Td (2 - Td or Tdap) 01/02/2028   Pneumonia Vaccine 47+ Years old  Completed   Hepatitis C Screening  Completed   Zoster Vaccines- Shingrix  Completed   HPV VACCINES  Aged Out     Education and counseling on the following was provided based on the above review of health and a plan/checklist for the patient, along with additional information discussed, was provided for the patient in the patient instructions :  -Advised on importance of completing advanced directives, discussed options for completing and provided information in patient instructions as well -Advised and counseled on a healthy lifestyle - including the importance of a healthy diet, regular physical activity, social connections and stress management. -Reviewed patient's current diet. . A summary of a healthy diet was provided in the Patient Instructions.  -reviewed patient's current physical activity level and Provided exercise guidelines for adults (See pt instructins). Provided community resources and ideas for safe exercise at home to assist in meeting exercise guideline recommendations in a safe and healthy way. -Advise yearly dental visits at minimum and regular eye exams -Advised and counseled on alcohol safe limits -advised inperson follow up with PCP regarding wt loss and OSA. Sent message to schedulers to assist in scheduling and  advised pt to call office to schedule. . May be related to supplement use, diet and exerise, but advised of other causes of wt loss and advised inperson evaluation with PCP. OSA may have resolved with wt loss.   Follow up: see patient instructions   Patient Instructions  I really enjoyed getting to talk with you today! I am available on Tuesdays and Thursdays for virtual visits if you have any questions or concerns, or if I can be of any further assistance.   CHECKLIST FROM ANNUAL WELLNESS VISIT:  -Follow up (please call to schedule if not scheduled after visit):   -schedule inperson follow up with Dr. Clent Ridges in the next 1 month regarding weight loss and sleep apnea   -yearly for annual wellness visit with primary care office  Here is a list of your preventive care/health maintenance measures and  the plan for each if any are due:  PLAN For any measures below that may be due:  -discuss lung cancer screening with Dr. Clent Ridges when you see him  Health Maintenance  Topic Date Due   Lung Cancer Screening  03/05/2021   COVID-19 Vaccine (6 - 2023-24 season) 05/05/2022   INFLUENZA VACCINE  11/08/2022   Medicare Annual Wellness (AWV)  10/23/2023   Colonoscopy  10/13/2024   DTaP/Tdap/Td (2 - Td or Tdap) 01/02/2028   Pneumonia Vaccine 27+ Years old  Completed   Hepatitis C Screening  Completed   Zoster Vaccines- Shingrix  Completed   HPV VACCINES  Aged Out    -See a dentist at least yearly  -Get your eyes checked and then per your eye specialist's recommendations  -Other issues addressed today:   -I have included below further information regarding a healthy whole foods based diet, physical activity guidelines for adults, stress management and opportunities for social connections. I hope you find this information useful.    -----------------------------------------------------------------------------------------------------------------------------------------------------------------------------------------------------------------------------------------------------------  NUTRITION: -eat real food: lots of colorful vegetables (half the plate) and fruits -5-7 servings of vegetables and fruits per day (fresh or steamed is best), exp. 2 servings of vegetables with lunch and dinner and 2 servings of fruit per day. Berries and greens such as kale and collards are great choices.  -consume on a regular basis: whole grains (make sure first ingredient on label contains the word "whole"), fresh fruits, fish, nuts, seeds, healthy oils (such as olive oil, avocado oil, grape seed oil) -may eat small amounts of dairy and lean meat on occasion, but avoid processed meats such as ham, bacon, lunch meat, etc. -drink water -try to avoid fast food and pre-packaged foods, processed meat -most experts advise limiting sodium to < 2300mg  per day, should limit further is any chronic conditions such as high blood pressure, heart disease, diabetes, etc. The American Heart Association advised that < 1500mg  is is ideal -try to avoid foods that contain any ingredients with names you do not recognize  -try to avoid sugar/sweets (except for the natural sugar that occurs in fresh fruit) -try to avoid sweet drinks -try to avoid white rice, white bread, pasta (unless whole grain), white or yellow potatoes  EXERCISE GUIDELINES FOR ADULTS: -if you wish to increase your physical activity, do so gradually and with the approval of your doctor -STOP and seek medical care immediately if you have any chest pain, chest discomfort or trouble breathing when starting or increasing exercise  -move and stretch your body, legs, feet and arms when sitting for long periods -Physical activity guidelines for optimal health in adults: -least 150 minutes per week of  aerobic exercise (can talk, but not sing) once approved by your doctor, 20-30 minutes of sustained activity or two 10 minute episodes of sustained activity every day.  -resistance training at least 2 days per week if approved by your doctor -balance exercises 3+ days per week:   Stand somewhere where you have something sturdy to hold onto if you lose balance.    1) lift up on toes, start with 5x per day and work up to 20x   2) stand and lift on leg straight out to the side so that foot is a few inches of the floor, start with 5x each side and work up to 20x each side   3) stand on one foot, start with 5 seconds each side and work up to 20 seconds on each side  If you need  ideas or help with getting more active:  -Silver sneakers https://tools.silversneakers.com  -Walk with a Doc: http://www.duncan-williams.com/  -try to include resistance (weight lifting/strength building) and balance exercises twice per week: or the following link for ideas: http://castillo-powell.com/  BuyDucts.dk  STRESS MANAGEMENT: -can try meditating, or just sitting quietly with deep breathing while intentionally relaxing all parts of your body for 5 minutes daily -if you need further help with stress, anxiety or depression please follow up with your primary doctor or contact the wonderful folks at [facility]: [phone]  SOCIAL CONNECTIONS: -options in [place] if you wish to engage in more social and exercise related activities:  -Silver sneakers https://tools.silversneakers.com  -Walk with a Doc: http://www.duncan-williams.com/  -Check out the [facility] Active Adults 50+ section on the [place] of [employer] (hiking clubs, book clubs, cards and games, chess, exercise classes, aquatic classes and much more) - see the website for  details: https://www.[place].gov/departments/parks-recreation/active-adults50  -YouTube has lots of exercise videos for different ages and abilities as well  -[facility] (a variety of indoor and outdoor inperson activities for adults). ([phone]. [address]  -Virtual Online Classes (a variety of topics): see seniorplanet.org or call [phone]  -consider volunteering at a school, hospice center, church, senior center or elsewhere           Terressa Koyanagi, DO

## 2022-10-24 ENCOUNTER — Telehealth: Payer: Self-pay

## 2022-10-24 IMAGING — CR DG LUMBAR SPINE 1V
1 series · 1 of 1 positions shown · non-contrast
Comparison: January 05, 2021.

CLINICAL DATA: L2-3 and L3-4 laminectomy.

EXAM:
LUMBAR SPINE - 1 VIEW

[lateral]
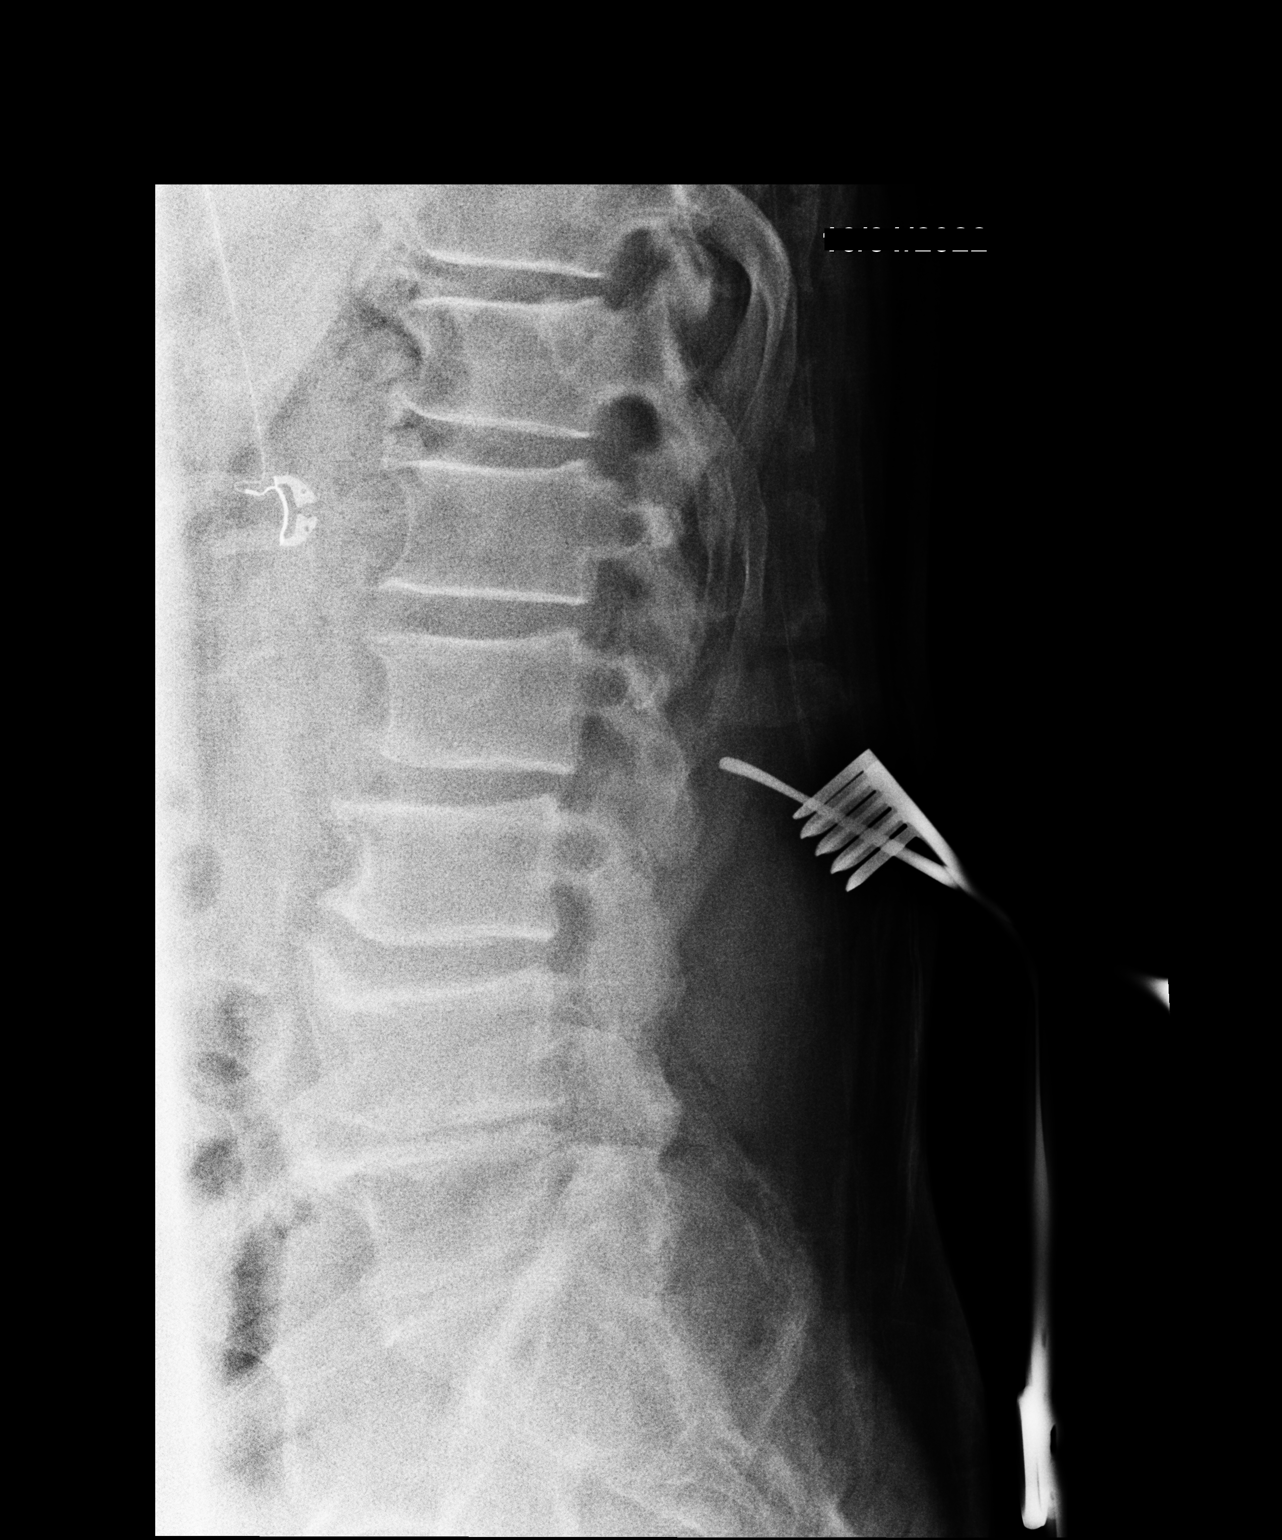

[1 of 1 positions shown; findings below may reference images not displayed]

FINDINGS: Single intraoperative cross-table lateral projection was obtained of
the lumbar spine. These images demonstrate surgical probe at
approximately the L2-3 level.
IMPRESSION: Surgical localization as described above.

## 2022-10-24 NOTE — Telephone Encounter (Signed)
Spoke to patient as he has ENT visits showing up in chart in July, but he also was referred to sleep dentistry for oral appliance in June. Patient states he is no longer interested in oral appliance. He saw ENT in July for sinusitis and that ENT said he might be an inspire candidate, which is why patient requested repeat sleep study. Sleep study has been ordered but does not show in our system as approved by insurance right now. Advised patient that once it is approved and performed he can discuss whether he is a candidate for Inspire device with Dr. Mayford Knife.

## 2022-10-26 ENCOUNTER — Other Ambulatory Visit (HOSPITAL_COMMUNITY): Payer: Self-pay

## 2022-10-26 ENCOUNTER — Telehealth: Payer: Self-pay

## 2022-10-26 NOTE — Telephone Encounter (Signed)
Pharmacy Patient Advocate Encounter   Received notification from CoverMyMeds that prior authorization for Temazepam 30MG  capsules is required/requested.   Insurance verification completed.   The patient is insured through  [employer]  .   Per test claim: PA submitted to [facility] via CoverMyMeds Key/confirmation #/EOC [id number] Status is pending

## 2022-10-30 NOTE — Telephone Encounter (Signed)
Pharmacy Patient Advocate Encounter  Received notification from [employer] that Prior Authorization for Temazepam has been The information provided by your prescriber did not meet the requirements for covering this medication (prior authorization). Your plan does not allow coverage of this medication based on your prescriber answering No to the following question(s): Is the requested drug being prescribed for the short-term treatment of insomnia.  PA #/Case ID/Reference #: [id number]   Full Denial letter will be uploaded to media tab.  Appeals contact: Phone: [phone], or Fax: [phone]

## 2022-11-01 ENCOUNTER — Encounter: Payer: Self-pay | Admitting: Family Medicine

## 2022-11-01 ENCOUNTER — Ambulatory Visit (INDEPENDENT_AMBULATORY_CARE_PROVIDER_SITE_OTHER): Payer: Medicare HMO | Admitting: Family Medicine

## 2022-11-01 VITALS — BP 118/78 | HR 68 | Temp 98.2°F | Wt 218.0 lb

## 2022-11-01 DIAGNOSIS — R634 Abnormal weight loss: Secondary | ICD-10-CM

## 2022-11-01 DIAGNOSIS — Z122 Encounter for screening for malignant neoplasm of respiratory organs: Secondary | ICD-10-CM

## 2022-11-01 NOTE — Progress Notes (Signed)
   Subjective:    Patient ID: Michael Garner, male    DOB: 22-Nov-1955, 67 y.o.   MRN: [id number]  HPI    Review of Systems     Objective:   Physical Exam        Assessment & Plan:

## 2022-11-01 NOTE — Telephone Encounter (Signed)
Pt notified via My Chart

## 2022-11-01 NOTE — Progress Notes (Signed)
   Subjective:    Patient ID: Michael Garner, male    DOB: 01/24/1956, 67 y.o.   MRN: [id number]  HPI Here to discuss his recent weight loss. He has lost 34 lbs since December, but he has been trying to lose weight. He has changed his diet and he is exercising almost daily. He feels fine. He notes that he used to smoke but that he quit years ago. He has had a complete physical with lab work within the last year. He had a normal colonoscopy last year.    Review of Systems  Constitutional: Negative.   Respiratory: Negative.    Cardiovascular: Negative.   Gastrointestinal: Negative.   Genitourinary: Negative.        Objective:   Physical Exam Constitutional:      Appearance: Normal appearance.  Cardiovascular:     Rate and Rhythm: Normal rate and regular rhythm.     Pulses: Normal pulses.     Heart sounds: Normal heart sounds.  Pulmonary:     Effort: Pulmonary effort is normal.     Breath sounds: Normal breath sounds.  Neurological:     Mental Status: He is alert and oriented to person, place, and time. Mental status is at baseline.           Assessment & Plan:  He has had a significant weight loss in the last 7 months, but this seems appropriate considering his diet and exercise program. We agreed to set him up for a low dose lung CT to screen for lung cancer.  Gershon Crane, MD

## 2022-11-07 NOTE — Telephone Encounter (Signed)
I understand. Will he consider paying cash for it?

## 2022-11-10 ENCOUNTER — Other Ambulatory Visit: Payer: Self-pay | Admitting: Family Medicine

## 2022-11-10 DIAGNOSIS — M48062 Spinal stenosis, lumbar region with neurogenic claudication: Secondary | ICD-10-CM

## 2022-11-22 ENCOUNTER — Ambulatory Visit
Admission: RE | Admit: 2022-11-22 | Discharge: 2022-11-22 | Disposition: A | Discharge status: Home / Self Care | Payer: Medicare HMO | Source: Ambulatory Visit | Attending: Family Medicine | Admitting: Family Medicine

## 2022-11-22 DIAGNOSIS — Z122 Encounter for screening for malignant neoplasm of respiratory organs: Secondary | ICD-10-CM

## 2022-11-22 DIAGNOSIS — Z87891 Personal history of nicotine dependence: Secondary | ICD-10-CM | POA: Diagnosis not present

## 2022-11-23 ENCOUNTER — Encounter: Payer: Self-pay | Admitting: Family Medicine

## 2022-11-27 ENCOUNTER — Encounter: Payer: Self-pay | Admitting: Family Medicine

## 2022-11-27 ENCOUNTER — Encounter: Payer: Self-pay | Admitting: Allergy

## 2022-11-27 NOTE — Telephone Encounter (Signed)
Spoke with pt advised of the CT results and Dr Clent Ridges recommendation, pt verbalized understanding

## 2022-11-28 NOTE — Telephone Encounter (Signed)
Spoke with pt reviewed CT results with pt verbalized understanding of the results

## 2022-12-05 ENCOUNTER — Encounter: Payer: Self-pay | Admitting: Podiatry

## 2022-12-05 ENCOUNTER — Ambulatory Visit: Payer: Medicare HMO | Admitting: Podiatry

## 2022-12-05 DIAGNOSIS — D2372 Other benign neoplasm of skin of left lower limb, including hip: Secondary | ICD-10-CM

## 2022-12-05 NOTE — Progress Notes (Signed)
   Chief Complaint  Patient presents with   Callouses    RM7: patient is here for callouses    Subjective: 67 y.o. male presenting to the office today for evaluation of a symptomatic skin lesion to the plantar aspect of the left foot.  Gradual onset.  His PCP referred him here for further treatment evaluation   Past Medical History:  Diagnosis Date   A-fib (HCC)    Arthritis 2020   in hands   BPH with urinary obstruction    Erectile dysfunction    Hepatitis B infection    resolved    Hypogonadism, male    Pneumonia 2021   hospitalized   Pulmonary edema 03/05/2020   Recurrent upper respiratory infection (URI)    Sleep apnea, obstructive    sees Dr. Armanda Magic    Past Surgical History:  Procedure Laterality Date   ATRIAL FIBRILLATION ABLATION N/A 03/04/2020   Procedure: ATRIAL FIBRILLATION ABLATION;  Surgeon: Regan Lemming, MD;  Location: [facility];  Service: Cardiovascular;  Laterality: N/A;   CARDIOVERSION  05/16/2018   COLONOSCOPY  07/18/2011   per Dr. Jarold Motto, diverticulosis only, repeat in 10 yrs    LUMBAR LAMINECTOMY/DECOMPRESSION MICRODISCECTOMY  2018   LUMBAR LAMINECTOMY/DECOMPRESSION MICRODISCECTOMY N/A 02/06/2021   Procedure: LAMINECTOMY, FORAMINOTOMY LUMBAR TWO-THREE, LUMBAR THREE-FOUR; LEFT LUBMAR TWO-THREE DISCECTOMY;  Surgeon: Tressie Stalker, MD;  Location: [facility] OR;  Service: Neurosurgery;  Laterality: N/A;  LAMINECTOMY, FORAMINOTOMY LUMBAR TWO-THREE, LUMBAR THREE-FOUR; LEFT LUBMAR TWO-THREE DISCECTOMY   LUMBAR MICRODISCECTOMY  2002    Allergies  Allergen Reactions   Dextromethorphan Other (See Comments)    Per patient "feels like I'm having a heart attack."  Tolerates plain guaifenesin.   Penicillins     Not sure but immediate family members are allergic   Prednisone Palpitations    Patient "put me in afib"     Objective:  Physical Exam General: Alert and oriented x3 in no acute distress  Dermatology: Hyperkeratotic lesion(s)  present on the plantar aspect of the left foot. Pain on palpation with a central nucleated core noted. Skin is warm, dry and supple bilateral lower extremities. Negative for open lesions or macerations.  Vascular: Palpable pedal pulses bilaterally. No edema or erythema noted. Capillary refill within normal limits.  Neurological: Grossly intact via light touch  Musculoskeletal Exam: Pain on palpation at the keratotic lesion(s) noted. Range of motion within normal limits bilateral. Muscle strength 5/5 in all groups bilateral.  Assessment: 1.  Symptomatic benign skin lesion   Plan of Care:  -Patient evaluated -Excisional debridement of keratoic lesion(s) using a chisel blade was performed without incident.  -Salicylic acid applied with a bandaid.  Recommend salicylic acid daily -Return to the clinic PRN.   Felecia Shelling, DPM [facility]  Dr. Felecia Shelling, DPM    [address] [place], [place] [address]                Office [phone]  Fax [phone]

## 2022-12-09 ENCOUNTER — Other Ambulatory Visit: Payer: Self-pay | Admitting: Allergy

## 2022-12-12 DIAGNOSIS — F411 Generalized anxiety disorder: Secondary | ICD-10-CM | POA: Diagnosis not present

## 2023-01-02 ENCOUNTER — Other Ambulatory Visit (HOSPITAL_COMMUNITY): Admission: RE | Admit: 2023-01-02 | Payer: Medicare HMO | Source: Ambulatory Visit

## 2023-01-02 ENCOUNTER — Other Ambulatory Visit (INDEPENDENT_AMBULATORY_CARE_PROVIDER_SITE_OTHER): Payer: Medicare HMO

## 2023-01-02 ENCOUNTER — Ambulatory Visit (INDEPENDENT_AMBULATORY_CARE_PROVIDER_SITE_OTHER): Payer: Medicare HMO | Admitting: Family Medicine

## 2023-01-02 ENCOUNTER — Encounter: Payer: Self-pay | Admitting: Family Medicine

## 2023-01-02 VITALS — BP 120/76 | HR 75 | Temp 97.8°F | Ht 71.0 in | Wt 207.0 lb

## 2023-01-02 DIAGNOSIS — M48062 Spinal stenosis, lumbar region with neurogenic claudication: Secondary | ICD-10-CM | POA: Diagnosis not present

## 2023-01-02 DIAGNOSIS — N401 Enlarged prostate with lower urinary tract symptoms: Secondary | ICD-10-CM | POA: Diagnosis not present

## 2023-01-02 DIAGNOSIS — Z1283 Encounter for screening for malignant neoplasm of skin: Secondary | ICD-10-CM

## 2023-01-02 DIAGNOSIS — M19042 Primary osteoarthritis, left hand: Secondary | ICD-10-CM | POA: Diagnosis not present

## 2023-01-02 DIAGNOSIS — N138 Other obstructive and reflux uropathy: Secondary | ICD-10-CM | POA: Diagnosis not present

## 2023-01-02 DIAGNOSIS — G4700 Insomnia, unspecified: Secondary | ICD-10-CM

## 2023-01-02 DIAGNOSIS — R32 Unspecified urinary incontinence: Secondary | ICD-10-CM | POA: Insufficient documentation

## 2023-01-02 DIAGNOSIS — R739 Hyperglycemia, unspecified: Secondary | ICD-10-CM | POA: Diagnosis not present

## 2023-01-02 DIAGNOSIS — E291 Testicular hypofunction: Secondary | ICD-10-CM | POA: Diagnosis not present

## 2023-01-02 DIAGNOSIS — I480 Paroxysmal atrial fibrillation: Secondary | ICD-10-CM

## 2023-01-02 DIAGNOSIS — M19041 Primary osteoarthritis, right hand: Secondary | ICD-10-CM

## 2023-01-02 DIAGNOSIS — Z0000 Encounter for general adult medical examination without abnormal findings: Secondary | ICD-10-CM

## 2023-01-02 LAB — LIPID PANEL
Cholesterol: 195 mg/dL (ref 0–200)
HDL: 93.1 mg/dL (ref >39.00)
LDL Cholesterol: 89 mg/dL (ref 0–99)
NonHDL: 101.5
Total CHOL/HDL Ratio: 2
Triglycerides: 62 mg/dL (ref 0.0–149.0)
VLDL: 12.4 mg/dL (ref 0.0–40.0)

## 2023-01-02 LAB — BASIC METABOLIC PANEL
BUN: 25 mg/dL — ABNORMAL HIGH (ref 6–23)
CO2: 25 mEq/L (ref 19–32)
Calcium: 9 mg/dL (ref 8.4–10.5)
Chloride: 105 mEq/L (ref 96–112)
Creatinine, Ser: 0.78 mg/dL (ref 0.40–1.50)
GFR: 92.38 mL/min (ref >60.00)
Glucose, Bld: 84 mg/dL (ref 70–99)
Potassium: 4.2 mEq/L (ref 3.5–5.1)
Sodium: 140 mEq/L (ref 135–145)

## 2023-01-02 LAB — CBC WITH DIFFERENTIAL/PLATELET
Basophils Absolute: 0.1 10*3/uL (ref 0.0–0.1)
Basophils Relative: 1.2 % (ref 0.0–3.0)
Eosinophils Absolute: 0.2 10*3/uL (ref 0.0–0.7)
Eosinophils Relative: 3 % (ref 0.0–5.0)
HCT: 50.9 % (ref 39.0–52.0)
Hemoglobin: 16.6 g/dL (ref 13.0–17.0)
Lymphocytes Relative: 26 % (ref 12.0–46.0)
Lymphs Abs: 1.3 10*3/uL (ref 0.7–4.0)
MCHC: 32.7 g/dL (ref 30.0–36.0)
MCV: 94.8 fl (ref 78.0–100.0)
Monocytes Absolute: 0.4 10*3/uL (ref 0.1–1.0)
Monocytes Relative: 7.9 % (ref 3.0–12.0)
Neutro Abs: 3.2 10*3/uL (ref 1.4–7.7)
Neutrophils Relative %: 61.9 % (ref 43.0–77.0)
Platelets: 208 10*3/uL (ref 150.0–400.0)
RBC: 5.37 Mil/uL (ref 4.22–5.81)
RDW: 14.6 % (ref 11.5–15.5)
WBC: 5.1 10*3/uL (ref 4.0–10.5)

## 2023-01-02 LAB — HEPATIC FUNCTION PANEL
ALT: 24 U/L (ref 0–53)
AST: 31 U/L (ref 0–37)
Albumin: 4.5 g/dL (ref 3.5–5.2)
Alkaline Phosphatase: 77 U/L (ref 39–117)
Bilirubin, Direct: 0.2 mg/dL (ref 0.0–0.3)
Total Bilirubin: 1 mg/dL (ref 0.2–1.2)
Total Protein: 7 g/dL (ref 6.0–8.3)

## 2023-01-02 LAB — HEMOGLOBIN A1C: Hgb A1c MFr Bld: 5.4 % (ref 4.6–6.5)

## 2023-01-02 LAB — TSH: TSH: 1.27 u[IU]/mL (ref 0.35–5.50)

## 2023-01-02 LAB — PSA: PSA: 0.98 ng/mL (ref 0.10–4.00)

## 2023-01-02 NOTE — Progress Notes (Signed)
Subjective:    Patient ID: Michael Garner, male    DOB: 02/16/1956, 67 y.o.   MRN: [id number]  HPI Here to follow up on issues. He feels well in general. He still has urinary incontinence, but he has not seen Urology for about 4 years. He sleeps well without using his CPAP machine since he has lost some weight. He is not aware of any episodes of atrial fibrillation. We performed a non-contracted chest CT for cancer screening on 11-22-22 and this was negative for cancer, but it did show some mild COPD and a mild dilation of the ascending aorta at 4.2 cm. We had referred him to see Dr. Amy Swaziland for a dermatology exam last December, but he has never made an appt with her. He is meeting with a therapist named Vito Berger for his PTSD. His OA and back pain are stable.    Review of Systems  Constitutional: Negative.   HENT: Negative.    Eyes: Negative.   Respiratory: Negative.    Cardiovascular: Negative.   Gastrointestinal: Negative.   Genitourinary: Negative.   Musculoskeletal: Negative.   Skin: Negative.   Neurological: Negative.   Psychiatric/Behavioral: Negative.         Objective:   Physical Exam Constitutional:      General: He is not in acute distress.    Appearance: Normal appearance. He is well-developed. He is not diaphoretic.  HENT:     Head: Normocephalic and atraumatic.     Right Ear: External ear normal.     Left Ear: External ear normal.     Nose: Nose normal.     Mouth/Throat:     Pharynx: No oropharyngeal exudate.  Eyes:     General: No scleral icterus.       Right eye: No discharge.        Left eye: No discharge.     Conjunctiva/sclera: Conjunctivae normal.     Pupils: Pupils are equal, round, and reactive to light.  Neck:     Thyroid: No thyromegaly.     Vascular: No JVD.     Trachea: No tracheal deviation.  Cardiovascular:     Rate and Rhythm: Normal rate and regular rhythm.     Pulses: Normal pulses.     Heart sounds: Normal heart sounds. No murmur  heard.    No friction rub. No gallop.  Pulmonary:     Effort: Pulmonary effort is normal. No respiratory distress.     Breath sounds: Normal breath sounds. No wheezing or rales.  Chest:     Chest wall: No tenderness.  Abdominal:     General: Bowel sounds are normal. There is no distension.     Palpations: Abdomen is soft. There is no mass.     Tenderness: There is no abdominal tenderness. There is no guarding or rebound.  Genitourinary:    Penis: Normal. No tenderness.      Testes: Normal.     Prostate: Normal.     Rectum: Normal. Guaiac result negative.  Musculoskeletal:        General: No tenderness. Normal range of motion.     Cervical back: Neck supple.  Lymphadenopathy:     Cervical: No cervical adenopathy.  Skin:    General: Skin is warm and dry.     Coloration: Skin is not pale.     Findings: No erythema or rash.  Neurological:     General: No focal deficit present.     Mental Status: He is alert  and oriented to person, place, and time.     Cranial Nerves: No cranial nerve deficit.     Motor: No abnormal muscle tone.     Coordination: Coordination normal.     Deep Tendon Reflexes: Reflexes are normal and symmetric. Reflexes normal.  Psychiatric:        Mood and Affect: Mood normal.        Behavior: Behavior normal.        Thought Content: Thought content normal.        Judgment: Judgment normal.           Assessment & Plan:  He is doing well overall. Get fasting labs to check lipids, etc. He will make an appt with Dr. Swaziland for a dermatology exam soon. We will refer him back to Dr. Liliane Shi for a urologic follow up. His OA and back pain are stable. He will continue meeting with his therapist for the PTSD. We will plan to repeat a chest CT next summer to follow the aortic dilation. He remains in sinus rhythm with good rate and BP control. We spent a total of ( 35   ) minutes reviewing records and discussing these issues.  Gershon Crane, MD

## 2023-01-03 ENCOUNTER — Encounter: Payer: Self-pay | Admitting: Family Medicine

## 2023-01-04 NOTE — Telephone Encounter (Signed)
I did the referral 

## 2023-01-04 NOTE — Telephone Encounter (Signed)
Please ask our referral specialist to find a different Dermatology group for him to see

## 2023-01-04 NOTE — Addendum Note (Signed)
Addended by: Gershon Crane A on: 01/04/2023 09:17 AM   Modules accepted: Orders

## 2023-01-09 DIAGNOSIS — N401 Enlarged prostate with lower urinary tract symptoms: Secondary | ICD-10-CM | POA: Diagnosis not present

## 2023-01-09 DIAGNOSIS — N3943 Post-void dribbling: Secondary | ICD-10-CM | POA: Diagnosis not present

## 2023-01-12 ENCOUNTER — Other Ambulatory Visit: Payer: Self-pay | Admitting: Family Medicine

## 2023-01-23 DIAGNOSIS — R238 Other skin changes: Secondary | ICD-10-CM | POA: Diagnosis not present

## 2023-01-23 DIAGNOSIS — Z7189 Other specified counseling: Secondary | ICD-10-CM | POA: Diagnosis not present

## 2023-01-23 DIAGNOSIS — L814 Other melanin hyperpigmentation: Secondary | ICD-10-CM | POA: Diagnosis not present

## 2023-01-23 DIAGNOSIS — L538 Other specified erythematous conditions: Secondary | ICD-10-CM | POA: Diagnosis not present

## 2023-01-23 DIAGNOSIS — L821 Other seborrheic keratosis: Secondary | ICD-10-CM | POA: Diagnosis not present

## 2023-01-23 DIAGNOSIS — D225 Melanocytic nevi of trunk: Secondary | ICD-10-CM | POA: Diagnosis not present

## 2023-01-23 DIAGNOSIS — B078 Other viral warts: Secondary | ICD-10-CM | POA: Diagnosis not present

## 2023-01-23 DIAGNOSIS — D492 Neoplasm of unspecified behavior of bone, soft tissue, and skin: Secondary | ICD-10-CM | POA: Diagnosis not present

## 2023-02-01 ENCOUNTER — Encounter: Payer: Self-pay | Admitting: Allergy

## 2023-02-05 DIAGNOSIS — F411 Generalized anxiety disorder: Secondary | ICD-10-CM | POA: Diagnosis not present

## 2023-02-14 DIAGNOSIS — J310 Chronic rhinitis: Secondary | ICD-10-CM | POA: Diagnosis not present

## 2023-02-14 DIAGNOSIS — J342 Deviated nasal septum: Secondary | ICD-10-CM | POA: Diagnosis not present

## 2023-03-05 DIAGNOSIS — H33312 Horseshoe tear of retina without detachment, left eye: Secondary | ICD-10-CM | POA: Diagnosis not present

## 2023-03-05 DIAGNOSIS — H43812 Vitreous degeneration, left eye: Secondary | ICD-10-CM | POA: Diagnosis not present

## 2023-03-05 DIAGNOSIS — H4312 Vitreous hemorrhage, left eye: Secondary | ICD-10-CM | POA: Diagnosis not present

## 2023-03-13 ENCOUNTER — Encounter: Payer: Self-pay | Admitting: Family Medicine

## 2023-03-13 DIAGNOSIS — F411 Generalized anxiety disorder: Secondary | ICD-10-CM | POA: Diagnosis not present

## 2023-03-15 ENCOUNTER — Other Ambulatory Visit: Payer: Self-pay

## 2023-03-15 ENCOUNTER — Encounter (HOSPITAL_COMMUNITY): Payer: Self-pay

## 2023-03-15 ENCOUNTER — Emergency Department (HOSPITAL_COMMUNITY)
Admission: EM | Admit: 2023-03-15 | Discharge: 2023-03-15 | Disposition: A | Discharge status: Home / Self Care | Payer: Medicare HMO | Attending: Student | Admitting: Student

## 2023-03-15 ENCOUNTER — Ambulatory Visit (HOSPITAL_COMMUNITY)
Admission: EM | Admit: 2023-03-15 | Discharge: 2023-03-15 | Disposition: A | Discharge status: Home / Self Care | Payer: Medicare HMO | Attending: Family Medicine | Admitting: Family Medicine

## 2023-03-15 ENCOUNTER — Emergency Department (HOSPITAL_COMMUNITY): Payer: Medicare HMO

## 2023-03-15 DIAGNOSIS — R279 Unspecified lack of coordination: Secondary | ICD-10-CM | POA: Diagnosis not present

## 2023-03-15 DIAGNOSIS — I480 Paroxysmal atrial fibrillation: Secondary | ICD-10-CM | POA: Diagnosis not present

## 2023-03-15 DIAGNOSIS — R202 Paresthesia of skin: Secondary | ICD-10-CM | POA: Insufficient documentation

## 2023-03-15 DIAGNOSIS — G930 Cerebral cysts: Secondary | ICD-10-CM | POA: Diagnosis not present

## 2023-03-15 LAB — CBC WITH DIFFERENTIAL/PLATELET
Abs Immature Granulocytes: 0.08 10*3/uL — ABNORMAL HIGH (ref 0.00–0.07)
Basophils Absolute: 0.1 10*3/uL (ref 0.0–0.1)
Basophils Relative: 1 %
Eosinophils Absolute: 0.1 10*3/uL (ref 0.0–0.5)
Eosinophils Relative: 2 %
HCT: 46.8 % (ref 39.0–52.0)
Hemoglobin: 15.8 g/dL (ref 13.0–17.0)
Immature Granulocytes: 1 %
Lymphocytes Relative: 21 %
Lymphs Abs: 1.2 10*3/uL (ref 0.7–4.0)
MCH: 32.1 pg (ref 26.0–34.0)
MCHC: 33.8 g/dL (ref 30.0–36.0)
MCV: 95.1 fL (ref 80.0–100.0)
Monocytes Absolute: 0.5 10*3/uL (ref 0.1–1.0)
Monocytes Relative: 9 %
Neutro Abs: 3.8 10*3/uL (ref 1.7–7.7)
Neutrophils Relative %: 66 %
Platelets: 203 10*3/uL (ref 150–400)
RBC: 4.92 MIL/uL (ref 4.22–5.81)
RDW: 13.7 % (ref 11.5–15.5)
WBC: 5.8 10*3/uL (ref 4.0–10.5)
nRBC: 0 % (ref 0.0–0.2)

## 2023-03-15 LAB — COMPREHENSIVE METABOLIC PANEL
ALT: 26 U/L (ref 0–44)
AST: 28 U/L (ref 15–41)
Albumin: 4.4 g/dL (ref 3.5–5.0)
Alkaline Phosphatase: 71 U/L (ref 38–126)
Anion gap: 7 (ref 5–15)
BUN: 21 mg/dL (ref 8–23)
CO2: 23 mmol/L (ref 22–32)
Calcium: 9 mg/dL (ref 8.9–10.3)
Chloride: 107 mmol/L (ref 98–111)
Creatinine, Ser: 0.78 mg/dL (ref 0.61–1.24)
GFR, Estimated: >60 mL/min (ref >60)
Glucose, Bld: 96 mg/dL (ref 70–99)
Potassium: 4.1 mmol/L (ref 3.5–5.1)
Sodium: 137 mmol/L (ref 135–145)
Total Bilirubin: 0.7 mg/dL (ref <1.2)
Total Protein: 7 g/dL (ref 6.5–8.1)

## 2023-03-15 NOTE — ED Notes (Signed)
Patient is being discharged from the Urgent Care and sent to the Emergency Department via private vehicle . Per Marlinda Mike MD, patient is in need of higher level of care due to right-sided facial tingling x 5 days. Patient is aware and verbalizes understanding of plan of care.  Vitals:   03/15/23 1227  BP: (!) 149/84  Pulse: 76  Resp: 18  Temp: 98 F (36.7 C)  SpO2: 96%

## 2023-03-15 NOTE — Discharge Instructions (Addendum)
He will proceed to the ER for further evaluation

## 2023-03-15 NOTE — ED Provider Notes (Addendum)
[facility]    CSN: [id number] Arrival date & time: 03/15/23  1156      History   Chief Complaint Chief Complaint  Patient presents with   Tingling    HPI Michael Garner is a 67 y.o. male.   HPI Here for intermittent tingling in his right cheek and jaw for about 5 days.  It will happen several times an hour and will last just a few seconds.  No headache no hearing loss.  In the last couple of weeks at least he has noted maybe some incoordination when typing on the phone.  He has had long-term numbness in his right foot due to a history of lumbar radiculopathy, but now his left foot is also numb.  His left eye is a little blurry, but he just had a retinal detachment repaired  He does not have diabetes  No syncope  He has made an appointment with his primary care on December 11.    Past Medical History:  Diagnosis Date   A-fib [facility])    Arthritis 2020   in hands   BPH with urinary obstruction    Erectile dysfunction    Hepatitis B infection    resolved    Hypogonadism, male    Pneumonia 2021   hospitalized   Pulmonary edema 03/05/2020   Recurrent upper respiratory infection (URI)    Sleep apnea, obstructive    sees Dr. Armanda Magic    Patient Active Problem List   Diagnosis Date Noted   Urinary incontinence 01/02/2023   Chronic rhinitis 09/17/2022   Hypogonadism, male 05/24/2022   Sleep apnea, obstructive 05/24/2022   Obesity (BMI 30-39.9) 06/28/2021   Spinal stenosis of lumbar region with neurogenic claudication 02/06/2021   RSV (acute bronchiolitis due to respiratory syncytial virus) 03/06/2020   Shortness of breath 03/05/2020   Acute pulmonary edema (HCC) 03/05/2020   Paroxysmal atrial fibrillation (HCC) 06/24/2019   Osteoarthritis 01/19/2019   Peroneal tendinitis 01/13/2018   Depressive disorder, not elsewhere classified 12/19/2009   INSOMNIA 12/19/2009   LUMBAR STRAIN 11/17/2009   BPH with urinary obstruction 09/30/2009   Disorder of  bursae and tendons in shoulder region 05/12/2008   HEPATITIS B, HX OF 05/12/2008    Past Surgical History:  Procedure Laterality Date   ATRIAL FIBRILLATION ABLATION N/A 03/04/2020   Procedure: ATRIAL FIBRILLATION ABLATION;  Surgeon: Regan Lemming, MD;  Location: [facility];  Service: Cardiovascular;  Laterality: N/A;   CARDIOVERSION  05/16/2018   COLONOSCOPY  10/13/2021   per Dr. Tomasa Rand, adenomatous polyps, repeat in 3 yrs   LUMBAR LAMINECTOMY/DECOMPRESSION MICRODISCECTOMY  2018   LUMBAR LAMINECTOMY/DECOMPRESSION MICRODISCECTOMY N/A 02/06/2021   Procedure: LAMINECTOMY, FORAMINOTOMY LUMBAR TWO-THREE, LUMBAR THREE-FOUR; LEFT LUBMAR TWO-THREE DISCECTOMY;  Surgeon: Tressie Stalker, MD;  Location: [facility];  Service: Neurosurgery;  Laterality: N/A;  LAMINECTOMY, FORAMINOTOMY LUMBAR TWO-THREE, LUMBAR THREE-FOUR; LEFT LUBMAR TWO-THREE DISCECTOMY   LUMBAR MICRODISCECTOMY  2002       Home Medications    Prior to Admission medications   Medication Sig Start Date End Date Taking? Authorizing Provider  acetaminophen (TYLENOL) 500 MG tablet Take 500 mg by mouth in the morning and at bedtime. 09/10/19   [provider]  alfuzosin (UROXATRAL) 10 MG 24 hr tablet TAKE 1 TABLET BY MOUTH DAILY WITH BREAKFAST 01/15/23   Nelwyn Salisbury, MD  ALPRAZolam Prudy Feeler) 0.5 MG tablet Take 1 tablet (0.5 mg total) by mouth 2 (two) times daily as needed for anxiety. 09/26/22   Gershon Crane  A, MD  diltiazem (CARDIZEM CD) 360 MG 24 hr capsule Take 1 capsule (360 mg total) by mouth daily. 03/21/22   Camnitz, Will Daphine Deutscher, MD  dutasteride (AVODART) 0.5 MG capsule TAKE ONE CAPSULE BY MOUTH DAILY 10/10/22   Nelwyn Salisbury, MD  fexofenadine (ALLEGRA) 180 MG tablet TAKE 1 TABLET BY MOUTH DAILY 12/11/22   Marcelyn Bruins, MD  ibuprofen (ADVIL) 400 MG tablet Take 400 mg by mouth as needed. 09/10/19   [provider]  meloxicam (MOBIC) 15 MG tablet TAKE 1 TABLET BY MOUTH DAILY 11/13/22   Nelwyn Salisbury, MD  mometasone (NASONEX) 50 MCG/ACT nasal spray Place 1 spray into the nose daily.    [provider]  Multiple Vitamins-Minerals (MULTIVITAMIN WITH MINERALS) tablet Take 1 tablet by mouth daily.    [provider]  tadalafil (CIALIS) 20 MG tablet Take 1 tablet (20 mg total) by mouth as needed for erectile dysfunction. 03/19/22 06/10/33  Nelwyn Salisbury, MD  temazepam (RESTORIL) 30 MG capsule Take 1 capsule (30 mg total) by mouth at bedtime as needed for sleep. 10/17/22   Nelwyn Salisbury, MD  Timmothy Sours 93 MCG/ACT EXHU Place 2 sprays into the nose in the morning and at bedtime. 09/27/22   Marcelyn Bruins, MD    Family History Family History  Problem Relation Age of Onset   Allergic rhinitis Father    Alcohol abuse Father    Lung cancer Father    Allergic rhinitis Brother    Colon cancer Neg Hx    Colon polyps Neg Hx    Esophageal cancer Neg Hx    Stomach cancer Neg Hx    Rectal cancer Neg Hx     Social History Social History   Tobacco Use   Smoking status: Former    Current packs/day: 0.00    Types: Cigarettes    Quit date: 2002    Years since quitting: 22.9   Smokeless tobacco: Never  Vaping Use   Vaping status: Never Used  Substance Use Topics   Alcohol use: Yes    Alcohol/week: 10.0 standard drinks of alcohol    Types: 10 Cans of beer per week    Comment: "couple of beers after work every night"   Drug use: Yes    Types: Marijuana     Allergies   Dextromethorphan, Penicillins, and Prednisone   Review of Systems Review of Systems   Physical Exam Triage Vital Signs ED Triage Vitals  Encounter Vitals Group     BP 03/15/23 1227 (!) 149/84     Systolic BP Percentile --      Diastolic BP Percentile --      Pulse Rate 03/15/23 1227 76     Resp 03/15/23 1227 18     Temp 03/15/23 1227 98 F (36.7 C)     Temp Source 03/15/23 1227 Oral     SpO2 03/15/23 1227 96 %     Weight 03/15/23 1224 215 lb (97.5 kg)     Height 03/15/23 1224  5\' 11"  (1.803 m)     Head Circumference --      Peak Flow --      Pain Score 03/15/23 1224 0     Pain Loc --      Pain Education --      Exclude from Growth Chart --    No data found.  Updated Vital Signs BP (!) 149/84 (BP Location: Right Arm)   Pulse 76   Temp 98 F (  36.7 C) (Oral)   Resp 18   Ht 5\' 11"  (1.803 m)   Wt 97.5 kg   SpO2 96%   BMI 29.99 kg/m   Visual Acuity Right Eye Distance:   Left Eye Distance:   Bilateral Distance:    Right Eye Near:   Left Eye Near:    Bilateral Near:     Physical Exam Vitals reviewed.  Constitutional:      General: He is not in acute distress.    Appearance: He is not ill-appearing, toxic-appearing or diaphoretic.  HENT:     Right Ear: Tympanic membrane and ear canal normal.     Left Ear: Tympanic membrane and ear canal normal.     Nose: Nose normal.     Mouth/Throat:     Mouth: Mucous membranes are moist.     Pharynx: No oropharyngeal exudate or posterior oropharyngeal erythema.     Comments: No facial droop. Eyes:     Extraocular Movements: Extraocular movements intact.     Conjunctiva/sclera: Conjunctivae normal.     Pupils: Pupils are equal, round, and reactive to light.  Cardiovascular:     Rate and Rhythm: Normal rate and regular rhythm.     Heart sounds: No murmur heard. Pulmonary:     Effort: Pulmonary effort is normal.     Breath sounds: Normal breath sounds.  Musculoskeletal:     Cervical back: Neck supple.  Lymphadenopathy:     Cervical: No cervical adenopathy.  Skin:    Capillary Refill: Capillary refill takes less than 2 seconds.     Coloration: Skin is not jaundiced or pale.  Neurological:     Mental Status: He is alert and oriented to person, place, and time.     Cranial Nerves: No cranial nerve deficit.     Motor: No weakness.     Gait: Gait normal.     Deep Tendon Reflexes: Reflexes normal.     Comments: Sensation is normal on the face bilaterally.  Psychiatric:        Behavior: Behavior normal.       UC Treatments / Results  Labs (all labs ordered are listed, but only abnormal results are displayed) Labs Reviewed - No data to display  EKG   Radiology No results found.  Procedures Procedures (including critical care time)  Medications Ordered in UC Medications - No data to display  Initial Impression / Assessment and Plan / UC Course  I have reviewed the triage vital signs and the nursing notes.  Pertinent labs & imaging results that were available during my care of the patient were reviewed by me and considered in my medical decision making (see chart for details).      Lab reviewed from the last year--his last glucose in September of this year was 6 and his renal function is normal.  CBC was also normal.  He has had a normal vitamin D in the past.  Also TSH was normal in September.  I do not see a B12 level, but he takes B12 daily  I discussed with him some possibilities for his diagnosis.  I think it is best that he proceed to the emergency room for some urgent evaluation today.  He will go by private car.  He also will keep his follow-up with his primary care to determine further evaluation after he is seen in the emergency room Final Clinical Impressions(s) / UC Diagnoses   Final diagnoses:  Paresthesia  Incoordination     Discharge Instructions  He will proceed to the ER for further evaluation     ED Prescriptions   None    PDMP not reviewed this encounter.   Zenia Resides, MD 03/15/23 1245    Zenia Resides, MD 03/15/23 [phone]

## 2023-03-15 NOTE — ED Triage Notes (Signed)
Pt presents with tingling to the right side of face that comes and goes (radiates from jaw to ear) x 5 days. Pt states he has severe sinusitis and sees an ENT doctor, spoke with him about symptoms and he was concerned, did not believe sinuses were the cause of symptoms. Pt is also reporting numbness in feet ("long time") & shaking in his hands ("just started"). Pt denies taking medications for symptoms reported. Pt denies pain.

## 2023-03-15 NOTE — ED Provider Notes (Signed)
EMERGENCY DEPARTMENT AT [facility] Provider Note  CSN: [id number] Arrival date & time: 03/15/23 1256  Chief Complaint(s) Tingling  HPI Michael Garner is a 67 y.o. male with PMH A-fib no longer on anticoagulation, recurrent sinusitis and deviated septum currently scheduled for septoplasty on 04/22/2023 who presents emergency department for evaluation of facial paresthesias.  States that he has had intermittent tingling sensations on the V3 distribution on the right for the last 5 days.  Denies any facial droop, facial weakness, numbness, tingling, weakness of the upper extremities or lower extremities.  Denies visual changes or pain in the jaw.  Went to urgent care who transferred the patient to the emergency department for CT imaging.  Of note, patient states that he is using the Navage nasal irrigation system for his sinusitis and will frequently have increased pressure in his ears.  Numbness is transient and isolated to the V3 distribution.  Does not cross midline and does not extend into the orbital forehead.   Past Medical History Past Medical History:  Diagnosis Date   A-fib (HCC)    Arthritis 2020   in hands   BPH with urinary obstruction    Erectile dysfunction    Hepatitis B infection    resolved    Hypogonadism, male    Pneumonia 2021   hospitalized   Pulmonary edema 03/05/2020   Recurrent upper respiratory infection (URI)    Sleep apnea, obstructive    sees Dr. Armanda Magic   Patient Active Problem List   Diagnosis Date Noted   Urinary incontinence 01/02/2023   Chronic rhinitis 09/17/2022   Hypogonadism, male 05/24/2022   Sleep apnea, obstructive 05/24/2022   Obesity (BMI 30-39.9) 06/28/2021   Spinal stenosis of lumbar region with neurogenic claudication 02/06/2021   RSV (acute bronchiolitis due to respiratory syncytial virus) 03/06/2020   Shortness of breath 03/05/2020   Acute pulmonary edema (HCC) 03/05/2020   Paroxysmal atrial fibrillation  (HCC) 06/24/2019   Osteoarthritis 01/19/2019   Peroneal tendinitis 01/13/2018   Depressive disorder, not elsewhere classified 12/19/2009   INSOMNIA 12/19/2009   LUMBAR STRAIN 11/17/2009   BPH with urinary obstruction 09/30/2009   Disorder of bursae and tendons in shoulder region 05/12/2008   HEPATITIS B, HX OF 05/12/2008   Home Medication(s) Prior to Admission medications   Medication Sig Start Date End Date Taking? Authorizing Provider  acetaminophen (TYLENOL) 500 MG tablet Take 500 mg by mouth in the morning and at bedtime. 09/10/19   [provider]  alfuzosin (UROXATRAL) 10 MG 24 hr tablet TAKE 1 TABLET BY MOUTH DAILY WITH BREAKFAST 01/15/23   Nelwyn Salisbury, MD  ALPRAZolam Prudy Feeler) 0.5 MG tablet Take 1 tablet (0.5 mg total) by mouth 2 (two) times daily as needed for anxiety. 09/26/22   Nelwyn Salisbury, MD  diltiazem (CARDIZEM CD) 360 MG 24 hr capsule Take 1 capsule (360 mg total) by mouth daily. 03/21/22   Camnitz, Will Daphine Deutscher, MD  dutasteride (AVODART) 0.5 MG capsule TAKE ONE CAPSULE BY MOUTH DAILY 10/10/22   Nelwyn Salisbury, MD  fexofenadine (ALLEGRA) 180 MG tablet TAKE 1 TABLET BY MOUTH DAILY 12/11/22   Marcelyn Bruins, MD  ibuprofen (ADVIL) 400 MG tablet Take 400 mg by mouth as needed. 09/10/19   [provider]  meloxicam (MOBIC) 15 MG tablet TAKE 1 TABLET BY MOUTH DAILY 11/13/22   Nelwyn Salisbury, MD  mometasone (NASONEX) 50 MCG/ACT nasal spray Place 1 spray into the nose daily.    [provider]  Multiple Vitamins-Minerals (MULTIVITAMIN WITH MINERALS) tablet Take 1 tablet by mouth daily.    [provider]  tadalafil (CIALIS) 20 MG tablet Take 1 tablet (20 mg total) by mouth as needed for erectile dysfunction. 03/19/22 06/10/33  Nelwyn Salisbury, MD  temazepam (RESTORIL) 30 MG capsule Take 1 capsule (30 mg total) by mouth at bedtime as needed for sleep. 10/17/22   Nelwyn Salisbury, MD  Timmothy Sours 93 MCG/ACT EXHU Place 2 sprays into the nose in the morning and  at bedtime. 09/27/22   Marcelyn Bruins, MD                                                                                                                                    Past Surgical History Past Surgical History:  Procedure Laterality Date   ATRIAL FIBRILLATION ABLATION N/A 03/04/2020   Procedure: ATRIAL FIBRILLATION ABLATION;  Surgeon: Regan Lemming, MD;  Location: [facility] INVASIVE [facility] LAB;  Service: Cardiovascular;  Laterality: N/A;   CARDIOVERSION  05/16/2018   COLONOSCOPY  10/13/2021   per Dr. Tomasa Rand, adenomatous polyps, repeat in 3 yrs   LUMBAR LAMINECTOMY/DECOMPRESSION MICRODISCECTOMY  2018   LUMBAR LAMINECTOMY/DECOMPRESSION MICRODISCECTOMY N/A 02/06/2021   Procedure: LAMINECTOMY, FORAMINOTOMY LUMBAR TWO-THREE, LUMBAR THREE-FOUR; LEFT LUBMAR TWO-THREE DISCECTOMY;  Surgeon: Tressie Stalker, MD;  Location: [facility] OR;  Service: Neurosurgery;  Laterality: N/A;  LAMINECTOMY, FORAMINOTOMY LUMBAR TWO-THREE, LUMBAR THREE-FOUR; LEFT LUBMAR TWO-THREE DISCECTOMY   LUMBAR MICRODISCECTOMY  2002   Family History Family History  Problem Relation Age of Onset   Allergic rhinitis Father    Alcohol abuse Father    Lung cancer Father    Allergic rhinitis Brother    Colon cancer Neg Hx    Colon polyps Neg Hx    Esophageal cancer Neg Hx    Stomach cancer Neg Hx    Rectal cancer Neg Hx     Social History Social History   Tobacco Use   Smoking status: Former    Current packs/day: 0.00    Types: Cigarettes    Quit date: 2002    Years since quitting: 22.9   Smokeless tobacco: Never  Vaping Use   Vaping status: Never Used  Substance Use Topics   Alcohol use: Yes    Alcohol/week: 10.0 standard drinks of alcohol    Types: 10 Cans of beer per week    Comment: "couple of beers after work every night"   Drug use: Yes    Types: Marijuana   Allergies Dextromethorphan-guaifenesin, Dextromethorphan, Penicillins, and Prednisone  Review of Systems Review of Systems   Neurological:  Positive for numbness.    Physical Exam Vital Signs  I have reviewed the triage vital signs BP (!) 150/89   Pulse 70   Temp 98.2 F (36.8 C) (Oral)   Resp 16   Ht 5\' 11"  (1.803 m)   Wt 97 kg   SpO2 95%   BMI 29.83  kg/m   Physical Exam Constitutional:      General: He is not in acute distress.    Appearance: Normal appearance.  HENT:     Head: Normocephalic and atraumatic.     Nose: No congestion or rhinorrhea.  Eyes:     General:        Right eye: No discharge.        Left eye: No discharge.     Extraocular Movements: Extraocular movements intact.     Pupils: Pupils are equal, round, and reactive to light.  Cardiovascular:     Rate and Rhythm: Normal rate and regular rhythm.     Heart sounds: No murmur heard. Pulmonary:     Effort: No respiratory distress.     Breath sounds: No wheezing or rales.  Abdominal:     General: There is no distension.     Tenderness: There is no abdominal tenderness.  Musculoskeletal:        General: Normal range of motion.     Cervical back: Normal range of motion.  Skin:    General: Skin is warm and dry.  Neurological:     General: No focal deficit present.     Mental Status: He is alert.     ED Results and Treatments Labs (all labs ordered are listed, but only abnormal results are displayed) Labs Reviewed  CBC WITH DIFFERENTIAL/PLATELET - Abnormal; Notable for the following components:      Result Value   Abs Immature Granulocytes 0.08 (*)    All other components within normal limits  COMPREHENSIVE METABOLIC PANEL                                                                                                                          Radiology CT Head Wo Contrast  Result Date: 03/15/2023 CLINICAL DATA:  Tingling sensation firm right jaw to right ear, shaking in bilateral hands EXAM: CT HEAD WITHOUT CONTRAST TECHNIQUE: Contiguous axial images were obtained from the base of the skull through the vertex without  intravenous contrast. RADIATION DOSE REDUCTION: This exam was performed according to the departmental dose-optimization program which includes automated exposure control, adjustment of the mA and/or kV according to patient size and/or use of iterative reconstruction technique. COMPARISON:  None Available. FINDINGS: Brain: No evidence of acute infarction, hemorrhage, mass, mass effect, or midline shift. No hydrocephalus or extra-axial fluid collection. Posterior fossa arachnoid cyst. Vascular: No hyperdense vessel. Skull: Negative for fracture or focal lesion. Sinuses/Orbits: Mucous retention cyst in the left maxillary sinus. Mucosal thickening in the ethmoid air cells and right sphenoid sinus. No acute finding in the orbits. Other: The mastoid air cells are well aerated. IMPRESSION: No acute intracranial process. Electronically Signed   By: Wiliam Ke M.D.   On: 03/15/2023 16:00    Pertinent labs & imaging results that were available during my care of the patient were reviewed by me and considered in my medical decision making (see MDM for details).  Medications Ordered in ED Medications - No data to display                                                                                                                                   Procedures Procedures  (including critical care time)  Medical Decision Making / ED Course   This patient presents to the ED for concern of facial paresthesias, this involves an extensive number of treatment options, and is a complaint that carries with it a high risk of complications and morbidity.  The differential diagnosis includes preherpetic neuralgia, electrolyte abnormality, CVA, external facial nerve compression, eustachian tube dysfunction,  trigeminal neuralgia  MDM: Patient seen emergency room for evaluation of facial paresthesias.  Physical exam is unremarkable with no focal motor or sensory deficits, no cranial nerve deficits.  No sensory deficit in  the face on dedicated neuroexam.  No facial weakness noted.  Laboratory evaluation unremarkable.  CT imaging unremarkable.  Differential is broad but I have overall lower suspicion for acute CVA at this time given normal neurologic exam with normal imaging in the ER today.  Presentation may be related to his sinus disease/pressure differential from his nasal irrigation, but if patient were to develop a rash in the V3 distribution this may be early signs of impending shingles.  At this time however he does not meet inpatient criteria for admission and will be discharged with outpatient follow-up.  Given return precautions of which she voiced understanding patient discharged  Additional history obtained:  -External records from outside source obtained and reviewed including: Chart review including previous notes, labs, imaging, consultation notes   Lab Tests: -I ordered, reviewed, and interpreted labs.   The pertinent results include:   Labs Reviewed  CBC WITH DIFFERENTIAL/PLATELET - Abnormal; Notable for the following components:      Result Value   Abs Immature Granulocytes 0.08 (*)    All other components within normal limits  COMPREHENSIVE METABOLIC PANEL     Imaging Studies ordered: I ordered imaging studies including CT head I independently visualized and interpreted imaging. I agree with the radiologist interpretation   Medicines ordered and prescription drug management: No orders of the defined types were placed in this encounter.   -I have reviewed the patients home medicines and have made adjustments as needed  Critical interventions none    Cardiac Monitoring: The patient was maintained on a cardiac monitor.  I personally viewed and interpreted the cardiac monitored which showed an underlying rhythm of: NSR  Social Determinants of Health:  Factors impacting patients care include: none   Reevaluation: After the interventions noted above, I reevaluated the patient and  found that they have :stayed the same  Co morbidities that complicate the patient evaluation  Past Medical History:  Diagnosis Date   A-fib (HCC)    Arthritis 2020   in hands   BPH with urinary obstruction    Erectile dysfunction  Hepatitis B infection    resolved    Hypogonadism, male    Pneumonia 2021   hospitalized   Pulmonary edema 03/05/2020   Recurrent upper respiratory infection (URI)    Sleep apnea, obstructive    sees Dr. Armanda Magic      Dispostion: I considered admission for this patient, but at this time he does not meet inpatient criteria for admission and will be discharged with outpatient follow-up with strict return precautions     Final Clinical Impression(s) / ED Diagnoses Final diagnoses:  Paresthesia     @PCDICTATION @    Glendora Score, MD 03/15/23 2126

## 2023-03-15 NOTE — ED Triage Notes (Signed)
C/o tingling sensation from right jaw to right ear and shaking to bilateral hands  x 1 week. Pt reports the tingling sensation to jaw and ear is intermittent and last 10-15 secs at a time.  Denies headache, hearing loss, vision problems.  No facial droop noted, sensation is normal.

## 2023-03-20 ENCOUNTER — Ambulatory Visit: Payer: Medicare HMO | Admitting: Family Medicine

## 2023-03-20 ENCOUNTER — Encounter: Payer: Self-pay | Admitting: Family Medicine

## 2023-03-20 VITALS — BP 124/78 | HR 58 | Temp 98.5°F | Wt 217.0 lb

## 2023-03-20 DIAGNOSIS — E538 Deficiency of other specified B group vitamins: Secondary | ICD-10-CM | POA: Diagnosis not present

## 2023-03-20 DIAGNOSIS — G509 Disorder of trigeminal nerve, unspecified: Secondary | ICD-10-CM

## 2023-03-20 LAB — VITAMIN B12: Vitamin B-12: >1537 pg/mL — ABNORMAL HIGH (ref 211–911)

## 2023-03-20 NOTE — Progress Notes (Signed)
   Subjective:    Patient ID: Michael Garner, male    DOB: 17-Jan-1956, 67 y.o.   MRN: [id number]  HPI Here to follow up on an ED visit on 03-15-23 for 5 days of intermittent tingling on the right side of his face. No pain, no drooping. No rashes. He feels fine otherwise. At the ED he had a non-contrasted head CT which was unremarkable. His labs were normal. He was told he was not having a stroke and he was to follow up with [place]. Today he is still having these spells of tingling. They last about 15 seconds at a time.    Review of Systems  Constitutional: Negative.   Respiratory: Negative.    Cardiovascular: Negative.   Neurological:  Positive for numbness. Negative for dizziness, tremors, seizures, syncope, facial asymmetry, speech difficulty, weakness, light-headedness and headaches.       Objective:   Physical Exam Constitutional:      General: He is not in acute distress.    Appearance: Normal appearance.  Cardiovascular:     Rate and Rhythm: Normal rate and regular rhythm.     Pulses: Normal pulses.     Heart sounds: Normal heart sounds.  Pulmonary:     Effort: Pulmonary effort is normal.     Breath sounds: Normal breath sounds.  Skin:    Findings: No rash.  Neurological:     General: No focal deficit present.     Mental Status: He is alert and oriented to person, place, and time.     Cranial Nerves: No cranial nerve deficit.     Motor: No weakness.     Gait: Gait normal.           Assessment & Plan:  Trigeminal neuropathy. We will check a B12 level today. I reassured him this usually resolves on its own. If he develops pain with it, we could treat it with either Prednisone or Gabapentin. We spent a total of (33   ) minutes reviewing records and discussing these issues.  Gershon Crane, MD

## 2023-03-21 ENCOUNTER — Encounter: Payer: Self-pay | Admitting: *Deleted

## 2023-03-21 NOTE — Telephone Encounter (Signed)
No threat. The B12 level is transiently high soon after a shot. Any extra B12 in the body gets flushed out through the kidneys

## 2023-03-26 ENCOUNTER — Other Ambulatory Visit: Payer: Self-pay | Admitting: Cardiology

## 2023-03-26 MED ORDER — DILTIAZEM HCL ER COATED BEADS 360 MG PO CP24: 360.0000 mg | ORAL_CAPSULE | Freq: Every day | ORAL | 0 refills | Status: DC

## 2023-04-01 DIAGNOSIS — F411 Generalized anxiety disorder: Secondary | ICD-10-CM | POA: Diagnosis not present

## 2023-04-05 DIAGNOSIS — H31092 Other chorioretinal scars, left eye: Secondary | ICD-10-CM | POA: Diagnosis not present

## 2023-04-05 DIAGNOSIS — H4312 Vitreous hemorrhage, left eye: Secondary | ICD-10-CM | POA: Diagnosis not present

## 2023-04-05 DIAGNOSIS — H2513 Age-related nuclear cataract, bilateral: Secondary | ICD-10-CM | POA: Diagnosis not present

## 2023-04-05 DIAGNOSIS — H35033 Hypertensive retinopathy, bilateral: Secondary | ICD-10-CM | POA: Diagnosis not present

## 2023-04-12 ENCOUNTER — Other Ambulatory Visit: Payer: Self-pay | Admitting: Family Medicine

## 2023-04-15 ENCOUNTER — Encounter (HOSPITAL_BASED_OUTPATIENT_CLINIC_OR_DEPARTMENT_OTHER): Payer: Self-pay | Admitting: Otolaryngology

## 2023-04-15 ENCOUNTER — Other Ambulatory Visit: Payer: Self-pay

## 2023-04-15 ENCOUNTER — Telehealth: Payer: Self-pay

## 2023-04-15 NOTE — Progress Notes (Signed)
 Per Dr. Mal Amabile, patient will need cardiac clearance prior to procedure.  Spoke with Elease Hashimoto at Dr. Gae Gallop office and will contact patient.

## 2023-04-15 NOTE — Telephone Encounter (Signed)
   Pre-operative Risk Assessment    Patient Name: Michael Garner  DOB: 22-Jun-1955 MRN: [id number]   Date of last office visit: 09/21/22 Date of next office visit: none   Request for Surgical Clearance    Procedure:  Nasal septoplasty with turbinate reduction   Date of Surgery:  Clearance 04/22/23                                 Surgeon:  Dr. Jesus Socks Group or Practice Name:  [facility] Phone number:  [phone] Fax number:  [phone]   Type of Clearance Requested:   - Medical    Type of Anesthesia:  not listed    Additional requests/questions:    SignedConnye GORMAN Hoit   04/15/2023, 5:06 PM

## 2023-04-16 NOTE — Telephone Encounter (Signed)
 Pt has been scheduled to see Ulyses Jarred, PA-C, 04/17/23, clearance will be addressed at that time.  Will route to the requesting surgeon's office to make them aware.

## 2023-04-16 NOTE — Telephone Encounter (Signed)
 Primary Cardiologist:None  Chart reviewed as part of pre-operative protocol coverage. Because of Michael Garner's past medical history and time since last visit, he/she will require a follow-up visit in order to better assess preoperative cardiovascular risk.  Pre-op covering staff: - Please schedule appointment and call patient to inform them (last seen by Dr. Inocencio 02/2022). Sees Dr. Shlomo for sleep management only.  - Please contact requesting surgeon's office via preferred method (i.e, phone, fax) to inform them of need for appointment prior to surgery.  If applicable, this message will also be routed to pharmacy pool and/or primary cardiologist for input on holding anticoagulant/antiplatelet agent as requested below so that this information is available at time of patient's appointment.   Percy Rosaline HERO, NP  04/16/2023, 8:11 AM

## 2023-04-17 ENCOUNTER — Ambulatory Visit: Payer: Medicare HMO | Attending: Physician Assistant | Admitting: Physician Assistant

## 2023-04-17 ENCOUNTER — Encounter: Payer: Self-pay | Admitting: Physician Assistant

## 2023-04-17 VITALS — BP 126/66 | HR 75 | Ht 71.0 in | Wt 223.0 lb

## 2023-04-17 DIAGNOSIS — Z01818 Encounter for other preprocedural examination: Secondary | ICD-10-CM | POA: Diagnosis not present

## 2023-04-17 DIAGNOSIS — I480 Paroxysmal atrial fibrillation: Secondary | ICD-10-CM | POA: Diagnosis not present

## 2023-04-17 NOTE — Patient Instructions (Signed)
 Medication Instructions:    Your physician recommends that you continue on your current medications as directed. Please refer to the Current Medication list given to you today.  *If you need a refill on your cardiac medications before your next appointment, please call your pharmacy*   Lab Work:  NONE ORDERED  TODAY    If you have labs (blood work) drawn today and your tests are completely normal, you will receive your results only by: MyChart Message (if you have MyChart) OR A paper copy in the mail If you have any lab test that is abnormal or we need to change your treatment, we will call you to review the results.   Testing/Procedures: NONE ORDERED  TODAY     Follow-Up: At [facility], you and your health needs are our priority.  As part of our continuing mission to provide you with exceptional heart care, we have created designated Provider Care Teams.  These Care Teams include your primary Cardiologist (physician) and Advanced Practice Providers (APPs -  Physician Assistants and Nurse Practitioners) who all work together to provide you with the care you need, when you need it.  We recommend signing up for the patient portal called "MyChart".  Sign up information is provided on this After Visit Summary.  MyChart is used to connect with patients for Virtual Visits (Telemedicine).  Patients are able to view lab/test results, encounter notes, upcoming appointments, etc.  Non-urgent messages can be sent to your provider as well.   To learn more about what you can do with MyChart, go to [email or web address].    Your next appointment:   1 year(s)   Provider:    You may see Will Jorja Loa, MD or one of the following Advanced Practice Providers on your designated Care Team:   Francis Dowse, [place] Casimiro Needle "Mardelle Matte" [place], [place] Canary Brim, NP  Other Instructions

## 2023-04-17 NOTE — Progress Notes (Signed)
 Cardiology Office Note:  .   Date:  04/17/2023  ID:  India Kite, DOB [birth year], MRN [id number] PCP: Johnny Garnette LABOR, MD  [facility] Providers Cardiologist:  None Electrophysiologist:  Will Gladis Norton, MD  Sleep Medicine:  Wilbert Bihari, MD {  History of Present Illness: Herrick Hartog is a 68 y.o. male w/PMHx of OSA (described as severe) w/CPAP, BPH, AFib  He saw Dr. Norton 03/07/22, at that time reported a couple episodes (in the year) of SOB when he gets back to bed in the night after going to the BR, had to sit in the chair fir a few minutes to recover his breathing then able to get back in bed, sleeping/breathing well. No AFib.  He also inquired about coming off dilt. Planned for an echo, dilt stopped discussed for a couple weeks, if more Afib to let him know Not on OAC with score of one  LVEF 60-65%, no WMA, RV OK, , no significant VHD  Saw Dr. Bihari June 2024, compliant with CPAP, mask had a leak, just replaced, discussed referral for [facility]  Today's visit is scheduled as per-operative evaluation pending Nasal septoplasty with turbinate reduction  RCRI is zero (0.4%)  ROS:   He looks forward tohis ENT surgery Struggling initially with persistent nasal congestion >> afrin >> sense of chronic head cold >> off afrin, still with constant sense of nasal congestions and has stopped going to the gym because of it.  Typically enjoys the elliptical with good exertional capacity Though remains very active, manages a resteraunt and constantly moving. No CP, SOB Or exertional intolerances No dizzy spells, near syncope or syncope No symptoms of his Afib since his ablation  No new diagnosis', his CHA2DS2Vasc score remains one  Arrhythmia/AAD hx AFib found 2020 AFib ablation 03/04/20 (re-admitted > same day flash pulm edema/RSV +/viral PNA)  Amiodarone  started 03/09/2020 (during hospitalization with RSV/PNA, pulm edema) >> stopped 06/09/20  Studies Reviewed: SABRA    EKG  done today and reviewed by myself:  SR 75bpm, baseline movement, no changed from prior  03/28/22: TTE 1. Left ventricular ejection fraction, by estimation, is 60 to 65%. Left  ventricular ejection fraction by 3D volume is 61 %. The left ventricle has  normal function. The left ventricle has no regional wall motion  abnormalities. Left ventricular diastolic   parameters were normal. The average left ventricular global longitudinal  strain is -23.4 %. The global longitudinal strain is normal.   2. Right ventricular systolic function is normal. The right ventricular  size is normal. There is normal pulmonary artery systolic pressure.   3. Left atrial size was mildly dilated.   4. The mitral valve is normal in structure. Trivial mitral valve  regurgitation.   5. The aortic valve is tricuspid. Aortic valve regurgitation is not  visualized.   6. There is mild dilatation of the ascending aorta, measuring 41 mm.   7. The inferior vena cava is normal in size with greater than 50%  respiratory variability, suggesting right atrial pressure of 3 mmHg.   Comparison(s): Prior images reviewed side by side. The right ventricular  hypertrophy has improved.    03/05/23: EPS/ablation CONCLUSIONS: 1. Atrial fibrillation upon presentation.   2. Successful electrical isolation and anatomical encircling of all four pulmonary veins with radiofrequency current.  A WACA approach was used 3. Additional left atrial ablation was performed with a standard box lesion created along the posterior wall of the left atrium 4. Atrial fibrillation successfully  cardioverted to sinus rhythm. 5. No early apparent complications.   02/26/2020: cardiac CT IMPRESSION: 1. There is normal pulmonary vein drainage into the left atrium with ostial measurements above. 2. There is no thrombus in the left atrial appendage on delayed imaging. 3. The esophagus runs in close proximity to the left lower pulmonary vein ostium. 4.  No PFO/ASD. 5. Normal coronary origin. Right dominance. 6. CAC score of 36, which is 42nd percentile for age- and sex-matched controls.   Risk Assessment/Calculations:    Physical Exam:   VS:  There were no vitals taken for this visit.   Wt Readings from Last 3 Encounters:  03/20/23 217 lb (98.4 kg)  03/15/23 213 lb 13.5 oz (97 kg)  03/15/23 215 lb (97.5 kg)    GEN: Well nourished, well developed in no acute distress NECK: No JVD; No carotid bruits CARDIAC: RRR, no murmurs, rubs, gallops RESPIRATORY:  CTA b/l without rales, wheezing or rhonchi  ABDOMEN: Soft, non-tender, non-distended EXTREMITIES: No edema; No deformity   ASSESSMENT AND PLAN: .    persistent AFib CHA2DS2Vasc is one, not on OAC no burden by symptoms post ablation  Pre-operative evaluation Nasal surgery felt to be low cardiac risk procedure Pt has low RCRI score No cardiac contraindications to planned surgery      Dispo: continue annual visits with EP, sooner if needed  Signed, Charlies Macario Arthur, PA-C

## 2023-04-18 ENCOUNTER — Encounter: Payer: Self-pay | Admitting: Family Medicine

## 2023-04-21 NOTE — H&P (Signed)
 HPI:   Michael Garner is a 68 y.o. male who presents as a return Patient.   Current problem: Nasal obstruction.  HPI: He has lost about 40 pounds and his sleep apnea has resolved. He is no longer using CPAP. He did not have a sleep study but his CPAP machine was measuring his apnea events and it got down below 5. It was in the 40s. He is still having difficulty breathing through his nose. He is ready to start thinking about nasal surgery. He is using Afrin spray at nighttime.  PMH/Meds/All/SocHx/FamHx/ROS:   History reviewed. No pertinent past medical history.  History reviewed. No pertinent surgical history.  No family history of bleeding disorders, wound healing problems or difficulty with anesthesia.     Current Outpatient Medications:  acetaminophen  (TYLENOL ) 500 mg tablet, Take 500 mg by mouth daily as needed., Disp: , Rfl:  ALPRAZolam  (XANAX ) 0.5 mg tablet, Take 0.5 mg by mouth., Disp: , Rfl:  cetirizine (ZyrTEC) 10 mg tablet, Take 10 mg by mouth Once Daily., Disp: , Rfl:  diazePAM (VALIUM INTENSOL) 5 mg/mL conc, , Disp: , Rfl:  dilTIAZem  (CARDIZEM  CD) 360 mg 24 hr capsule, Take 360 mg by mouth., Disp: , Rfl:  dutasteride  (AVODART ) 0.5 mg capsule, Take 0.5 mg by mouth., Disp: , Rfl:  ibuprofen (MOTRIN) 400 mg tablet, Take 400 mg by mouth., Disp: , Rfl:  meloxicam  (MOBIC ) 15 mg tablet, Take 15 mg by mouth., Disp: , Rfl:  mometasone (NASONEX) 50 mcg/actuation spry spray, Administer 1 spray into affected nostril(s)., Disp: , Rfl:  multivitamin with minerals (Daily Multivitamin-Minerals) tab, Take 1 tablet by mouth Once Daily., Disp: , Rfl:  tadalafiL  (CIALIS ) 20 mg tablet, Take 20 mg by mouth., Disp: , Rfl:  metoprolol  tartrate (LOPRESSOR ) 25 mg tablet, Take 25 mg by mouth., Disp: , Rfl:  pseudoePHEDrine (SUDAFED) 30 mg tablet, Take 30 mg by mouth every 4 (four) hours as needed for congestion., Disp: , Rfl:    Physical Exam:   Healthy appearing in no distress. Breathing and voice  are clear. Nasal exam reveals rightward septal deviation with a large spur posteriorly and inferiorly on the left side. Mucosa otherwise looks healthy. Oral cavity and pharynx are clear. No adenopathy.  Independent Review of Additional Tests or Records:  none  Procedures:  none  Impression & Plans:  At this point I think nasal surgery could be beneficial. He needs to work on quitting the Afrin spray. We talked a lot about rebound effect. That is great that he does not have any further sleep apnea. That makes the surgery a lot more safe. He would like to schedule early in January.

## 2023-04-22 ENCOUNTER — Ambulatory Visit (HOSPITAL_BASED_OUTPATIENT_CLINIC_OR_DEPARTMENT_OTHER): Payer: Medicare HMO | Admitting: Certified Registered"

## 2023-04-22 ENCOUNTER — Encounter (HOSPITAL_BASED_OUTPATIENT_CLINIC_OR_DEPARTMENT_OTHER)
Admission: RE | Disposition: A | Discharge status: Home / Self Care | Payer: Self-pay | Source: Ambulatory Visit | Attending: Otolaryngology

## 2023-04-22 ENCOUNTER — Other Ambulatory Visit: Payer: Self-pay

## 2023-04-22 ENCOUNTER — Ambulatory Visit (HOSPITAL_BASED_OUTPATIENT_CLINIC_OR_DEPARTMENT_OTHER)
Admission: RE | Admit: 2023-04-22 | Discharge: 2023-04-22 | Disposition: A | Discharge status: Home / Self Care | Payer: Medicare HMO | Source: Ambulatory Visit | Attending: Otolaryngology | Admitting: Otolaryngology

## 2023-04-22 ENCOUNTER — Encounter (HOSPITAL_BASED_OUTPATIENT_CLINIC_OR_DEPARTMENT_OTHER): Payer: Self-pay | Admitting: Otolaryngology

## 2023-04-22 DIAGNOSIS — G4733 Obstructive sleep apnea (adult) (pediatric): Secondary | ICD-10-CM

## 2023-04-22 DIAGNOSIS — Z8619 Personal history of other infectious and parasitic diseases: Secondary | ICD-10-CM | POA: Diagnosis not present

## 2023-04-22 DIAGNOSIS — Z87891 Personal history of nicotine dependence: Secondary | ICD-10-CM | POA: Insufficient documentation

## 2023-04-22 DIAGNOSIS — J310 Chronic rhinitis: Secondary | ICD-10-CM | POA: Insufficient documentation

## 2023-04-22 DIAGNOSIS — J342 Deviated nasal septum: Secondary | ICD-10-CM | POA: Diagnosis not present

## 2023-04-22 DIAGNOSIS — I480 Paroxysmal atrial fibrillation: Secondary | ICD-10-CM

## 2023-04-22 DIAGNOSIS — F32A Depression, unspecified: Secondary | ICD-10-CM | POA: Insufficient documentation

## 2023-04-22 DIAGNOSIS — Z01818 Encounter for other preprocedural examination: Secondary | ICD-10-CM

## 2023-04-22 HISTORY — PX: NASAL SEPTOPLASTY W/ TURBINOPLASTY: SHX2070

## 2023-04-22 HISTORY — DX: Cardiac arrhythmia, unspecified: I49.9

## 2023-04-22 SURGERY — SEPTOPLASTY, NOSE, WITH NASAL TURBINATE REDUCTION
Anesthesia: General | Site: Nose | Laterality: Bilateral

## 2023-04-22 MED ORDER — BACITRACIN ZINC 500 UNIT/GM EX OINT
TOPICAL_OINTMENT | CUTANEOUS | Status: DC | PRN
  Administered 2023-04-22: 1 via TOPICAL

## 2023-04-22 MED ORDER — HYDROCODONE-ACETAMINOPHEN 7.5-325 MG PO TABS: 1.0000 | ORAL_TABLET | Freq: Four times a day (QID) | ORAL | 0 refills | Status: DC | PRN

## 2023-04-22 MED ORDER — BACITRACIN ZINC 500 UNIT/GM EX OINT
TOPICAL_OINTMENT | CUTANEOUS | Status: AC
  Filled 2023-04-22: qty 28.35

## 2023-04-22 MED ORDER — PROPOFOL 10 MG/ML IV BOLUS
INTRAVENOUS | Status: DC | PRN
  Administered 2023-04-22: 150 mg via INTRAVENOUS

## 2023-04-22 MED ORDER — MIDAZOLAM HCL 2 MG/2ML IJ SOLN
INTRAMUSCULAR | Status: AC
  Filled 2023-04-22: qty 2

## 2023-04-22 MED ORDER — OXYMETAZOLINE HCL 0.05 % NA SOLN
NASAL | Status: DC | PRN
  Administered 2023-04-22: 1 via TOPICAL

## 2023-04-22 MED ORDER — LIDOCAINE-EPINEPHRINE 1 %-1:100000 IJ SOLN
INTRAMUSCULAR | Status: AC
  Filled 2023-04-22: qty 1

## 2023-04-22 MED ORDER — ACETAMINOPHEN 500 MG PO TABS: 1000.0000 mg | ORAL_TABLET | Freq: Once | ORAL | Status: DC

## 2023-04-22 MED ORDER — ACETAMINOPHEN 160 MG/5ML PO SOLN: 325.0000 mg | ORAL | Status: DC | PRN

## 2023-04-22 MED ORDER — ONDANSETRON 4 MG PO TBDP: 4.0000 mg | ORAL_TABLET | Freq: Three times a day (TID) | ORAL | 0 refills | Status: DC | PRN

## 2023-04-22 MED ORDER — ONDANSETRON HCL 4 MG/2ML IJ SOLN
INTRAMUSCULAR | Status: DC | PRN
  Administered 2023-04-22: 4 mg via INTRAVENOUS

## 2023-04-22 MED ORDER — MIDAZOLAM HCL 5 MG/5ML IJ SOLN
INTRAMUSCULAR | Status: DC | PRN
  Administered 2023-04-22: 2 mg via INTRAVENOUS

## 2023-04-22 MED ORDER — BACITRACIN ZINC 500 UNIT/GM EX OINT
TOPICAL_OINTMENT | CUTANEOUS | Status: AC
  Filled 2023-04-22: qty 0.9

## 2023-04-22 MED ORDER — ROCURONIUM BROMIDE 100 MG/10ML IV SOLN
INTRAVENOUS | Status: DC | PRN
  Administered 2023-04-22: 60 mg via INTRAVENOUS

## 2023-04-22 MED ORDER — FENTANYL CITRATE (PF) 100 MCG/2ML IJ SOLN
INTRAMUSCULAR | Status: DC | PRN
  Administered 2023-04-22: 100 ug via INTRAVENOUS

## 2023-04-22 MED ORDER — FENTANYL CITRATE (PF) 100 MCG/2ML IJ SOLN
INTRAMUSCULAR | Status: AC
  Filled 2023-04-22: qty 2

## 2023-04-22 MED ORDER — DROPERIDOL 2.5 MG/ML IJ SOLN
0.6250 mg | Freq: Once | INTRAMUSCULAR | Status: DC | PRN
Start: 2023-04-22 — End: 2023-04-22

## 2023-04-22 MED ORDER — DEXAMETHASONE SODIUM PHOSPHATE 10 MG/ML IJ SOLN
INTRAMUSCULAR | Status: AC
  Filled 2023-04-22: qty 1

## 2023-04-22 MED ORDER — SUGAMMADEX SODIUM 200 MG/2ML IV SOLN
INTRAVENOUS | Status: DC | PRN
  Administered 2023-04-22: 200 mg via INTRAVENOUS
  Administered 2023-04-22: 100 mg via INTRAVENOUS

## 2023-04-22 MED ORDER — LIDOCAINE HCL (CARDIAC) PF 100 MG/5ML IV SOSY
PREFILLED_SYRINGE | INTRAVENOUS | Status: DC | PRN
  Administered 2023-04-22: 40 mg via INTRAVENOUS

## 2023-04-22 MED ORDER — ROCURONIUM BROMIDE 10 MG/ML (PF) SYRINGE
PREFILLED_SYRINGE | INTRAVENOUS | Status: AC
  Filled 2023-04-22: qty 10

## 2023-04-22 MED ORDER — LIDOCAINE-EPINEPHRINE 1 %-1:100000 IJ SOLN
INTRAMUSCULAR | Status: DC | PRN
  Administered 2023-04-22: 6 mL

## 2023-04-22 MED ORDER — OXYCODONE HCL 5 MG PO TABS
5.0000 mg | ORAL_TABLET | Freq: Once | ORAL | Status: DC | PRN
Start: 2023-04-22 — End: 2023-04-22

## 2023-04-22 MED ORDER — OXYMETAZOLINE HCL 0.05 % NA SOLN
NASAL | Status: AC
  Filled 2023-04-22: qty 30

## 2023-04-22 MED ORDER — FENTANYL CITRATE (PF) 100 MCG/2ML IJ SOLN
25.0000 ug | INTRAMUSCULAR | Status: DC | PRN
  Administered 2023-04-22: 50 ug via INTRAVENOUS

## 2023-04-22 MED ORDER — DEXAMETHASONE SODIUM PHOSPHATE 4 MG/ML IJ SOLN
INTRAMUSCULAR | Status: DC | PRN
  Administered 2023-04-22: 10 mg via INTRAVENOUS

## 2023-04-22 MED ORDER — OXYCODONE HCL 5 MG/5ML PO SOLN: 5.0000 mg | Freq: Once | ORAL | Status: DC | PRN

## 2023-04-22 MED ORDER — SODIUM CHLORIDE 0.9 % IV SOLN: INTRAVENOUS | Status: DC | PRN

## 2023-04-22 MED ORDER — PROPOFOL 10 MG/ML IV BOLUS
INTRAVENOUS | Status: AC
  Filled 2023-04-22: qty 20

## 2023-04-22 MED ORDER — ACETAMINOPHEN 10 MG/ML IV SOLN
1000.0000 mg | Freq: Once | INTRAVENOUS | Status: DC | PRN
Start: 2023-04-22 — End: 2023-04-22

## 2023-04-22 MED ORDER — LACTATED RINGERS IV SOLN: INTRAVENOUS | Status: DC

## 2023-04-22 MED ORDER — OXYMETAZOLINE HCL 0.05 % NA SOLN
2.0000 | NASAL | Status: DC
  Administered 2023-04-22: 2 via NASAL

## 2023-04-22 MED ORDER — ACETAMINOPHEN 325 MG PO TABS: 325.0000 mg | ORAL_TABLET | ORAL | Status: DC | PRN

## 2023-04-22 MED ORDER — ONDANSETRON HCL 4 MG/2ML IJ SOLN
INTRAMUSCULAR | Status: AC
  Filled 2023-04-22: qty 2

## 2023-04-22 SURGICAL SUPPLY — 33 items
ATTRACTOMAT 16X20 MAGNETIC DRP (DRAPES) IMPLANT
CANISTER SUCT 1200ML W/VALVE (MISCELLANEOUS) ×1 IMPLANT
COAGULATOR SUCT 8FR VV (MISCELLANEOUS) IMPLANT
DRSG NASOPORE 8CM (GAUZE/BANDAGES/DRESSINGS) IMPLANT
DRSG TELFA 3X8 NADH STRL (GAUZE/BANDAGES/DRESSINGS) IMPLANT
ELECT REM PT RETURN 9FT ADLT (ELECTROSURGICAL) ×1
ELECTRODE REM PT RTRN 9FT ADLT (ELECTROSURGICAL) ×1 IMPLANT
GAUZE 4X4 16PLY ~~LOC~~+RFID DBL (SPONGE) IMPLANT
GAUZE SPONGE 2X2 STRL 8-PLY (GAUZE/BANDAGES/DRESSINGS) ×1 IMPLANT
GLOVE BIOGEL PI IND STRL 7.5 (GLOVE) IMPLANT
GLOVE ECLIPSE 7.5 STRL STRAW (GLOVE) ×1 IMPLANT
GLOVE SURG SYN 7.5 E (GLOVE) ×1
GLOVE SURG SYN 7.5 PF PI (GLOVE) IMPLANT
GOWN STRL REUS W/ TWL LRG LVL3 (GOWN DISPOSABLE) ×2 IMPLANT
HEMOSTAT SURGICEL .5X2 ABSORB (HEMOSTASIS) IMPLANT
NDL PRECISIONGLIDE 27X1.5 (NEEDLE) ×1 IMPLANT
NEEDLE PRECISIONGLIDE 27X1.5 (NEEDLE) ×1
NS IRRIG 1000ML POUR BTL (IV SOLUTION) IMPLANT
PACK BASIN DAY SURGERY FS (CUSTOM PROCEDURE TRAY) ×1 IMPLANT
PACK ENT DAY SURGERY (CUSTOM PROCEDURE TRAY) ×1 IMPLANT
PATTIES SURGICAL .5 X3 (DISPOSABLE) ×1 IMPLANT
SHEET SILICONE 2X3 0.03 REINF (MISCELLANEOUS) IMPLANT
SLEEVE SCD COMPRESS KNEE MED (STOCKING) ×1 IMPLANT
SPIKE FLUID TRANSFER (MISCELLANEOUS) IMPLANT
STRIP CLOSURE SKIN 1/2X4 (GAUZE/BANDAGES/DRESSINGS) IMPLANT
SUT CHROMIC 4 0 P 3 18 (SUTURE) ×1 IMPLANT
SUT CHROMIC 4 0 RB 1X27 (SUTURE) IMPLANT
SUT ETHILON 3 0 PS 1 (SUTURE) IMPLANT
SUT ETHILON 4 0 CL P 3 (SUTURE) IMPLANT
SUT ETHILON 6 0 P 1 (SUTURE) IMPLANT
SUT PLAIN 4 0 ~~LOC~~ 1 (SUTURE) ×1 IMPLANT
TOWEL GREEN STERILE FF (TOWEL DISPOSABLE) ×1 IMPLANT
YANKAUER SUCT BULB TIP NO VENT (SUCTIONS) ×1 IMPLANT

## 2023-04-22 NOTE — Anesthesia Postprocedure Evaluation (Signed)
 Anesthesia Post Note  Patient: Michael Garner  Procedure(s) Performed: NASAL SEPTOPLASTY WITH TURBINATE REDUCTION (Bilateral: Nose)     Patient location during evaluation: PACU Anesthesia Type: General Level of consciousness: awake and alert Pain management: pain level controlled Vital Signs Assessment: post-procedure vital signs reviewed and stable Respiratory status: spontaneous breathing, nonlabored ventilation, respiratory function stable and patient connected to nasal cannula oxygen  Cardiovascular status: blood pressure returned to baseline and stable Postop Assessment: no apparent nausea or vomiting Anesthetic complications: no  No notable events documented.  Last Vitals:  Vitals:   04/22/23 1015 04/22/23 1030  BP: (!) 168/97 (!) 159/90  Pulse: 76 74  Resp: 15 13  Temp:    SpO2: 96% 95%    Last Pain:  Vitals:   04/22/23 1030  TempSrc:   PainSc: 3                  Franky JONETTA Bald

## 2023-04-22 NOTE — Interval H&P Note (Signed)
 History and Physical Interval Note:  04/22/2023 8:38 AM  Michael Garner  has presented today for surgery, with the diagnosis of Chronic rhinitis Deviated septum.  The various methods of treatment have been discussed with the patient and family. After consideration of risks, benefits and other options for treatment, the patient has consented to  Procedure(s): NASAL SEPTOPLASTY WITH TURBINATE REDUCTION (Bilateral) as a surgical intervention.  The patient's history has been reviewed, patient examined, no change in status, stable for surgery.  I have reviewed the patient's chart and labs.  Questions were answered to the patient's satisfaction.     Michael Garner

## 2023-04-22 NOTE — Transfer of Care (Signed)
 Immediate Anesthesia Transfer of Care Note  Patient: Michael Garner  Procedure(s) Performed: NASAL SEPTOPLASTY WITH TURBINATE REDUCTION (Bilateral: Nose)  Patient Location: PACU  Anesthesia Type:General  Level of Consciousness: awake, alert , and oriented  Airway & Oxygen  Therapy: Patient Spontanous Breathing and Patient connected to face mask oxygen   Post-op Assessment: Report given to RN  Post vital signs: Reviewed and stable  Last Vitals:  Vitals Value Taken Time  BP 178/96 04/22/23 1005  Temp 36.3 C 04/22/23 1005  Pulse 77 04/22/23 1012  Resp 17 04/22/23 1012  SpO2 96 % 04/22/23 1012  Vitals shown include unfiled device data.  Last Pain:  Vitals:   04/22/23 1005  TempSrc:   PainSc: 6       Patients Stated Pain Goal: 3 (04/22/23 0845)  Complications: No notable events documented.

## 2023-04-22 NOTE — Op Note (Signed)
 OPERATIVE REPORT  DATE OF SURGERY: 04/22/2023  PATIENT:  Michael Garner,  68 y.o. male  PRE-OPERATIVE DIAGNOSIS:  Chronic rhinitis Deviated septum  POST-OPERATIVE DIAGNOSIS:  Chronic rhinitis Deviated septum  PROCEDURE:  Procedure(s): NASAL SEPTOPLASTY WITH TURBINATE REDUCTION  SURGEON:  Ida VEAR Loader, MD  ASSISTANTS: none  ANESTHESIA:   General   EBL: 20 ml  DRAINS: none  LOCAL MEDICATIONS USED:  1% Xylocaine  with epinephrine   SPECIMEN:  none  COUNTS:  Correct  PROCEDURE DETAILS: The patient was taken to the operating room and placed on the operating table in the supine position. Following induction of general endotracheal anesthesia, the nose was prepped and draped in a standard fashion. Afrin spray was used preoperatively in the holding area. 1% Xylocaine  with epinephrine  was infiltrated into the septum, the columella, and the inferior turbinates bilaterally.  1. Nasal septoplasty. A left hemitransfixion incision was created with a 15 scalpel. A mucoperichondrial flap was developed posteriorly down the left side of the nasal septum using a Cottle elevator. This was performed all the way to the sphenoid rostrum. The bony cartilaginous junction was divided and a similar flap was developed down the right side. The ethmoid plate was severely thickened and deflected towards the right and was mostly resected.  The maxillary crest was tilted to the right also and was taken down using Takahashi forceps.  2. Submucous resection inferior turbinates bilaterally. The leading edge of the inferior turbinates were incised in a vertical fashion with a #15 scalpel. The Cottle elevator was used to elevate mucosa off bone in all directions. Fragments of the turbinate bone were resected with a Takahashi forceps. The turbinate remnants were outfractured with the Therapist, nutritional.  All of these maneuvers greatly increased the nasal airways bilaterally. The nasal cavities were packed with rolled up Telfa  coated with bacitracin  ointment. The pharynx was suctioned of blood and secretions under direct visualization. The patient was awakened extubated and transferred to recovery in stable condition.    PATIENT DISPOSITION:  To PACU, stable

## 2023-04-22 NOTE — Anesthesia Procedure Notes (Signed)
 Procedure Name: Intubation Date/Time: 04/22/2023 9:30 AM  Performed by: Burnard Rosaline HERO, CRNAPre-anesthesia Checklist: Patient identified, Emergency Drugs available, Suction available and Patient being monitored Patient Re-evaluated:Patient Re-evaluated prior to induction Oxygen  Delivery Method: Circle system utilized Preoxygenation: Pre-oxygenation with 100% oxygen  Induction Type: IV induction Ventilation: Mask ventilation without difficulty Laryngoscope Size: 4 and Mac Grade View: Grade I Tube type: Oral Tube size: 7.5 mm Number of attempts: 1 Airway Equipment and Method: Stylet and Oral airway Placement Confirmation: ETT inserted through vocal cords under direct vision, positive ETCO2, breath sounds checked- equal and bilateral and CO2 detector Secured at: 23 cm Tube secured with: Tape Dental Injury: Teeth and Oropharynx as per pre-operative assessment

## 2023-04-22 NOTE — Anesthesia Preprocedure Evaluation (Addendum)
 Anesthesia Evaluation  Patient identified by MRN, date of birth, ID band Patient awake    Reviewed: Allergy  & Precautions, NPO status , Patient's Chart, lab work & pertinent test results  Airway Mallampati: I  TM Distance: >3 FB Neck ROM: Full    Dental  (+) Teeth Intact, Dental Advisory Given   Pulmonary sleep apnea , former smoker   breath sounds clear to auscultation       Cardiovascular + dysrhythmias Atrial Fibrillation  Rhythm:Regular Rate:Normal     Neuro/Psych  PSYCHIATRIC DISORDERS  Depression    negative neurological ROS     GI/Hepatic negative GI ROS,,,(+) Hepatitis -, B  Endo/Other  negative endocrine ROS    Renal/GU negative Renal ROS     Musculoskeletal  (+) Arthritis ,    Abdominal   Peds  Hematology negative hematology ROS (+)   Anesthesia Other Findings   Reproductive/Obstetrics                             Anesthesia Physical Anesthesia Plan  ASA: 3  Anesthesia Plan: General   Post-op Pain Management: Tylenol  PO (pre-op)*   Induction: Intravenous  PONV Risk Score and Plan: 3 and Ondansetron , Dexamethasone  and Midazolam   Airway Management Planned: Oral ETT  Additional Equipment: None  Intra-op Plan:   Post-operative Plan: Extubation in OR  Informed Consent: I have reviewed the patients History and Physical, chart, labs and discussed the procedure including the risks, benefits and alternatives for the proposed anesthesia with the patient or authorized representative who has indicated his/her understanding and acceptance.     Dental advisory given  Plan Discussed with: CRNA  Anesthesia Plan Comments:        Anesthesia Quick Evaluation

## 2023-04-23 ENCOUNTER — Encounter (HOSPITAL_BASED_OUTPATIENT_CLINIC_OR_DEPARTMENT_OTHER): Payer: Self-pay | Admitting: Otolaryngology

## 2023-04-24 MED ORDER — TEMAZEPAM 30 MG PO CAPS: 30.0000 mg | ORAL_CAPSULE | Freq: Every evening | ORAL | 5 refills | Status: AC | PRN

## 2023-04-24 NOTE — Telephone Encounter (Signed)
 I refilled the Temazepam

## 2023-04-29 ENCOUNTER — Other Ambulatory Visit: Payer: Self-pay

## 2023-04-29 MED ORDER — DILTIAZEM HCL ER COATED BEADS 360 MG PO CP24: 360.0000 mg | ORAL_CAPSULE | Freq: Every day | ORAL | 3 refills | Status: DC

## 2023-05-09 ENCOUNTER — Encounter: Payer: Self-pay | Admitting: Family Medicine

## 2023-05-10 NOTE — Telephone Encounter (Signed)
No. You only need a referral if you have not seen that doctor for over 3 years

## 2023-05-15 DIAGNOSIS — F411 Generalized anxiety disorder: Secondary | ICD-10-CM | POA: Diagnosis not present

## 2023-05-17 DIAGNOSIS — H31092 Other chorioretinal scars, left eye: Secondary | ICD-10-CM | POA: Diagnosis not present

## 2023-05-17 DIAGNOSIS — H35033 Hypertensive retinopathy, bilateral: Secondary | ICD-10-CM | POA: Diagnosis not present

## 2023-05-17 DIAGNOSIS — H43812 Vitreous degeneration, left eye: Secondary | ICD-10-CM | POA: Diagnosis not present

## 2023-05-17 DIAGNOSIS — H2513 Age-related nuclear cataract, bilateral: Secondary | ICD-10-CM | POA: Diagnosis not present

## 2023-05-21 ENCOUNTER — Other Ambulatory Visit: Payer: Self-pay | Admitting: Family Medicine

## 2023-06-01 ENCOUNTER — Encounter: Payer: Self-pay | Admitting: Family Medicine

## 2023-06-03 DIAGNOSIS — H33312 Horseshoe tear of retina without detachment, left eye: Secondary | ICD-10-CM | POA: Diagnosis not present

## 2023-06-03 DIAGNOSIS — H16223 Keratoconjunctivitis sicca, not specified as Sjogren's, bilateral: Secondary | ICD-10-CM | POA: Diagnosis not present

## 2023-06-05 DIAGNOSIS — F411 Generalized anxiety disorder: Secondary | ICD-10-CM | POA: Diagnosis not present

## 2023-06-06 NOTE — Telephone Encounter (Signed)
 Yes that is a possibility. He should see his spine surgeon (Dr. Lonna Cobb) and ask him this question. He could then have one of his associates do the injections

## 2023-06-10 ENCOUNTER — Encounter: Payer: Self-pay | Admitting: Family Medicine

## 2023-06-10 DIAGNOSIS — E291 Testicular hypofunction: Secondary | ICD-10-CM

## 2023-06-10 NOTE — Telephone Encounter (Signed)
 I really don't know. He would have to check with his insurance about this

## 2023-06-11 NOTE — Telephone Encounter (Signed)
 I assume TRT means testosterone replacement. Tell him Yes, I prescribe this quite a bit. If he wants me to treat this, he will need to have a blood level checked first. I will go ahead and place an order for this. Based on that result, we can prescribe something.

## 2023-06-12 ENCOUNTER — Other Ambulatory Visit (INDEPENDENT_AMBULATORY_CARE_PROVIDER_SITE_OTHER)

## 2023-06-12 DIAGNOSIS — H524 Presbyopia: Secondary | ICD-10-CM | POA: Diagnosis not present

## 2023-06-12 DIAGNOSIS — E291 Testicular hypofunction: Secondary | ICD-10-CM

## 2023-06-12 LAB — TESTOSTERONE: Testosterone: 824.06 ng/dL (ref 300.00–890.00)

## 2023-06-17 ENCOUNTER — Telehealth: Payer: Self-pay

## 2023-06-17 DIAGNOSIS — N401 Enlarged prostate with lower urinary tract symptoms: Secondary | ICD-10-CM | POA: Diagnosis not present

## 2023-06-17 DIAGNOSIS — N3943 Post-void dribbling: Secondary | ICD-10-CM | POA: Diagnosis not present

## 2023-06-17 NOTE — Telephone Encounter (Signed)
 Copied from CRM [id number]. Topic: Clinical - Medical Advice >> Jun 17, 2023  2:52 PM Shelbie Proctor wrote: Reason for CRM: Patient [id number] is trying to get a hold of Dr. Claris Che CMA about new medication for testosterone, patient had labs done alreay. Patient wants a call back, please advise.  [facility] [id number] - [place], [place] - [address] [place] [address] Phone:[phone]:[phone]

## 2023-06-18 MED ORDER — TESTOSTERONE CYPIONATE 200 MG/ML IM SOLN: 200.0000 mg | INTRAMUSCULAR | 1 refills | Status: DC

## 2023-06-18 MED ORDER — LUER LOCK SAFETY SYRINGES 21G X 1-1/2" 3 ML MISC: 1.0000 | 0 refills | Status: AC

## 2023-06-18 NOTE — Addendum Note (Signed)
 Addended by: Gershon Crane A on: 06/18/2023 04:49 PM   Modules accepted: Orders

## 2023-06-18 NOTE — Telephone Encounter (Signed)
 I sent in the testosterone for him to start. Recheck a level in 90 days

## 2023-06-18 NOTE — Telephone Encounter (Signed)
 Spoke with pt notified that Dr Clent Ridges send in Rx for Testosterone injection to his pharmacy, pt verbalized understanding

## 2023-06-18 NOTE — Telephone Encounter (Signed)
 Pt had his Testosterone level checked and wants to know what is the next step I assume TRT means testosterone replacement. Tell him Yes, I prescribe this quite a bit. If he wants me to treat this, he will need to have a blood level checked first. I will go ahead and place an order for this. Based on that result, we can prescribe something.

## 2023-06-21 DIAGNOSIS — N3943 Post-void dribbling: Secondary | ICD-10-CM | POA: Diagnosis not present

## 2023-06-21 DIAGNOSIS — N401 Enlarged prostate with lower urinary tract symptoms: Secondary | ICD-10-CM | POA: Diagnosis not present

## 2023-06-24 DIAGNOSIS — M47896 Other spondylosis, lumbar region: Secondary | ICD-10-CM | POA: Diagnosis not present

## 2023-06-24 DIAGNOSIS — M545 Low back pain, unspecified: Secondary | ICD-10-CM | POA: Diagnosis not present

## 2023-06-25 ENCOUNTER — Other Ambulatory Visit: Payer: Self-pay | Admitting: Urology

## 2023-06-25 ENCOUNTER — Encounter: Payer: Self-pay | Admitting: Urology

## 2023-06-25 DIAGNOSIS — N401 Enlarged prostate with lower urinary tract symptoms: Secondary | ICD-10-CM

## 2023-06-27 ENCOUNTER — Encounter: Payer: Self-pay | Admitting: Family Medicine

## 2023-06-27 DIAGNOSIS — M47816 Spondylosis without myelopathy or radiculopathy, lumbar region: Secondary | ICD-10-CM | POA: Diagnosis not present

## 2023-06-28 NOTE — Telephone Encounter (Signed)
 I hope I got that right, lol. Sorry to bother you yet again, but I made an appointment to come see you on the 2nd regarding this. I have been to the chiropractor 5 times for my neck pain, in the last month and a half, and nothing seems to be helping. Then it occurred to me, I still tingle on that side of my head and the pain in my neck is in the exact same place. It radiates from my lower neck and goes past my ear to an almost headache. Could it be pain from that nerve

## 2023-07-01 MED ORDER — METHYLPREDNISOLONE 4 MG PO TBPK: ORAL_TABLET | ORAL | 0 refills | Status: DC

## 2023-07-01 NOTE — Telephone Encounter (Signed)
 I sent in a Medrol dose pack to help with this

## 2023-07-10 ENCOUNTER — Ambulatory Visit (INDEPENDENT_AMBULATORY_CARE_PROVIDER_SITE_OTHER)

## 2023-07-10 ENCOUNTER — Ambulatory Visit (INDEPENDENT_AMBULATORY_CARE_PROVIDER_SITE_OTHER): Admitting: Family Medicine

## 2023-07-10 ENCOUNTER — Ambulatory Visit
Admission: RE | Admit: 2023-07-10 | Discharge: 2023-07-10 | Disposition: A | Discharge status: Home / Self Care | Source: Ambulatory Visit | Attending: Urology | Admitting: Urology

## 2023-07-10 VITALS — BP 118/70 | HR 69 | Temp 97.8°F | Wt 224.0 lb

## 2023-07-10 DIAGNOSIS — M47812 Spondylosis without myelopathy or radiculopathy, cervical region: Secondary | ICD-10-CM | POA: Diagnosis not present

## 2023-07-10 DIAGNOSIS — M542 Cervicalgia: Secondary | ICD-10-CM | POA: Diagnosis not present

## 2023-07-10 DIAGNOSIS — F411 Generalized anxiety disorder: Secondary | ICD-10-CM | POA: Diagnosis not present

## 2023-07-10 DIAGNOSIS — N401 Enlarged prostate with lower urinary tract symptoms: Secondary | ICD-10-CM

## 2023-07-10 HISTORY — PX: IR RADIOLOGIST EVAL & MGMT: IMG5224

## 2023-07-10 NOTE — Progress Notes (Signed)
   Subjective:    Patient ID: Michael Garner, male    DOB: 1956/03/02, 68 y.o.   MRN: [id number]  HPI Here to follow up on neck pain. He sent [place] a message on 06-27-23 about a sharp pain in the right posterior neck that radiated up to behind the right ear. We felt that may represent a trigeminal neuralgia, so we treated him with a Medrol dose pack. He immediately felt better. He has also had a few manipulations by his chiropractor since then. Now for the past 2 days he has felt a similar but very mild pain in the right posterior neck.    Review of Systems  Constitutional: Negative.   Respiratory: Negative.    Cardiovascular: Negative.   Musculoskeletal:  Positive for neck pain.       Objective:   Physical Exam Constitutional:      Appearance: Normal appearance.  Cardiovascular:     Rate and Rhythm: Normal rate and regular rhythm.     Pulses: Normal pulses.     Heart sounds: Normal heart sounds.  Pulmonary:     Effort: Pulmonary effort is normal.     Breath sounds: Normal breath sounds.  Musculoskeletal:     Comments: He is slightly tender in the right posterior neck. No spasms. ROM is full   Neurological:     Mental Status: He is alert.           Assessment & Plan:  His neck pain is now more consistent with cervical radiculopathy rather than a neuralgia. I offered him another round of steroids, but he prefers to simply monitor this for now. We will get Xrays of the cervical spine today. Gershon Crane, MD

## 2023-07-10 NOTE — Progress Notes (Signed)
 Reason for visit: BPH with LUTS. Prostate embolization.   Care Team(s) Primary Care: Nelwyn Salisbury, MD Urology:  Rene Paci, MD  History of Present Illness:  [name] is a 68 y.o. male w PMHx significant for former smoking (20PY), chronic back pain, BPH and long standing LUTS s/p prior Rezum treatment (2021). He is closely followed by Urology and reportedly d/c'd initial trial of alpha blockade secondary to retrograde ejaculation. His symptoms have persisted vs worsened, with increasing urinary urgency, nocturia up to 5X/night and dribbling post urination.   He is on medical management with alfuzosin 10 mg QD and dutasteride 0.5 mg QD. ROS positive for OED on cialis and testosterone supplementation. He denies hematuria.   I used a International Prostatism Symptom Score* (IPSS) Score to quantify his symptoms : 25 points (Severe symptoms)   *Gery Pray MJ, Melene Muller MP, et al. The American Urological Association symptom index for benign prostatic hyperplasia. The Measurement Committee of the American Urological Association. J Urol A5431891; 161:0960.   Review of Systems: A 12 point ROS discussed and pertinent positives are indicated in the HPI above.  All other systems are negative.   Past Medical History:  Diagnosis Date   A-fib [facility])    Arthritis 2020   in hands   BPH with urinary obstruction    Dysrhythmia    Erectile dysfunction    Hepatitis B infection    resolved    Hypogonadism, male    Pneumonia 2021   hospitalized   Pulmonary edema 03/05/2020   Recurrent upper respiratory infection (URI)    Sleep apnea, obstructive    sees Dr. Armanda Magic    Past Surgical History:  Procedure Laterality Date   ATRIAL FIBRILLATION ABLATION N/A 03/04/2020   Procedure: ATRIAL FIBRILLATION ABLATION;  Surgeon: Regan Lemming, MD;  Location: [facility];  Service: Cardiovascular;  Laterality: N/A;   CARDIOVERSION  05/16/2018   COLONOSCOPY   10/13/2021   per Dr. Tomasa Rand, adenomatous polyps, repeat in 3 yrs   IR RADIOLOGIST EVAL & MGMT  07/10/2023   LUMBAR LAMINECTOMY/DECOMPRESSION MICRODISCECTOMY  2018   LUMBAR LAMINECTOMY/DECOMPRESSION MICRODISCECTOMY N/A 02/06/2021   Procedure: LAMINECTOMY, FORAMINOTOMY LUMBAR TWO-THREE, LUMBAR THREE-FOUR; LEFT LUBMAR TWO-THREE DISCECTOMY;  Surgeon: Tressie Stalker, MD;  Location: [facility] OR;  Service: Neurosurgery;  Laterality: N/A;  LAMINECTOMY, FORAMINOTOMY LUMBAR TWO-THREE, LUMBAR THREE-FOUR; LEFT LUBMAR TWO-THREE DISCECTOMY   LUMBAR MICRODISCECTOMY  2002   NASAL SEPTOPLASTY W/ TURBINOPLASTY Bilateral 04/22/2023   Procedure: NASAL SEPTOPLASTY WITH TURBINATE REDUCTION;  Surgeon: Serena Colonel, MD;  Location: [facility];  Service: ENT;  Laterality: Bilateral;    Allergies: Dextromethorphan-guaifenesin, Dextromethorphan, Penicillins, and Prednisone  Medications: Prior to Admission medications   Medication Sig Start Date End Date Taking? Authorizing Provider  acetaminophen (TYLENOL) 500 MG tablet Take 500 mg by mouth in the morning and at bedtime. 09/10/19   [provider]  alfuzosin (UROXATRAL) 10 MG 24 hr tablet TAKE 1 TABLET BY MOUTH DAILY WITH BREAKFAST 04/12/23   Nelwyn Salisbury, MD  ALPRAZolam Prudy Feeler) 0.5 MG tablet TAKE ONE TABLET BY MOUTH TWICE A DAY AS NEEDED FOR ANXIETY 05/28/23   Nelwyn Salisbury, MD  Cyanocobalamin (B-12 IJ) Inject as directed once a week.    [provider]  diltiazem (CARDIZEM CD) 360 MG 24 hr capsule Take 1 capsule (360 mg total) by mouth daily. 04/29/23   Camnitz, Will Daphine Deutscher, MD  dutasteride (AVODART) 0.5 MG capsule TAKE ONE CAPSULE BY MOUTH DAILY 10/10/22  Nelwyn Salisbury, MD  ibuprofen (ADVIL) 400 MG tablet Take 400 mg by mouth as needed. 09/10/19   [provider]  meloxicam (MOBIC) 15 MG tablet TAKE 1 TABLET BY MOUTH DAILY 11/13/22   Nelwyn Salisbury, MD  methylPREDNISolone (MEDROL DOSEPAK) 4 MG TBPK tablet As directed 07/01/23   Nelwyn Salisbury, MD  mometasone (NASONEX) 50 MCG/ACT nasal spray Place 1 spray into the nose daily.    [provider]  Multiple Vitamins-Minerals (MULTIVITAMIN WITH MINERALS) tablet Take 1 tablet by mouth daily.    [provider]  SYRINGE-NEEDLE, DISP, 3 ML (LUER LOCK SAFETY SYRINGES) 21G X 1-1/2" 3 ML MISC 1 application  by Does not apply route every 14 (fourteen) days. 06/18/23   Nelwyn Salisbury, MD  tadalafil (CIALIS) 20 MG tablet Take 1 tablet (20 mg total) by mouth as needed for erectile dysfunction. 03/19/22 06/10/33  Nelwyn Salisbury, MD  temazepam (RESTORIL) 30 MG capsule Take 1 capsule (30 mg total) by mouth at bedtime as needed for sleep. 04/24/23   Nelwyn Salisbury, MD  testosterone cypionate (DEPOTESTOSTERONE CYPIONATE) 200 MG/ML injection Inject 1 mL (200 mg total) into the muscle every 14 (fourteen) days. 06/18/23   Nelwyn Salisbury, MD     Family History  Problem Relation Age of Onset   Allergic rhinitis Father    Alcohol abuse Father    Lung cancer Father    Allergic rhinitis Brother    Colon cancer Neg Hx    Colon polyps Neg Hx    Esophageal cancer Neg Hx    Stomach cancer Neg Hx    Rectal cancer Neg Hx     Social History   Socioeconomic History   Marital status: Divorced    Spouse name: Not on file   Number of children: Not on file   Years of education: Not on file   Highest education level: Bachelor's degree (e.g., BA, AB, BS)  Occupational History   Not on file  Tobacco Use   Smoking status: Former    Current packs/day: 0.00    Types: Cigarettes    Quit date: 2002    Years since quitting: 23.2   Smokeless tobacco: Never  Vaping Use   Vaping status: Never Used  Substance and Sexual Activity   Alcohol use: Yes    Alcohol/week: 10.0 standard drinks of alcohol    Types: 10 Cans of beer per week    Comment: "couple of beers after work every night"   Drug use: Yes    Types: Marijuana   Sexual activity: Not on file  Other Topics Concern   Not on file   Social History Narrative   Not on file   Social Drivers of Health   Financial Resource Strain: Low Risk  (07/10/2023)   Overall Financial Resource Strain (CARDIA)    Difficulty of Paying Living Expenses: Not hard at all  Food Insecurity: No Food Insecurity (07/10/2023)   Hunger Vital Sign    Worried About Running Out of Food in the Last Year: Never true    Ran Out of Food in the Last Year: Never true  Transportation Needs: No Transportation Needs (07/10/2023)   PRAPARE - Administrator, Civil Service (Medical): No    Lack of Transportation (Non-Medical): No  Physical Activity: Insufficiently Active (07/10/2023)   Exercise Vital Sign    Days of Exercise per Week: 2 days    Minutes of Exercise per Session: 70 min  Stress: No  Stress Concern Present (07/10/2023)   [employer] of Occupational Health - Occupational Stress Questionnaire    Feeling of Stress : Not at all  Social Connections: Socially Isolated (07/10/2023)   Social Connection and Isolation Panel [NHANES]    Frequency of Communication with Friends and Family: Once a week    Frequency of Social Gatherings with Friends and Family: Once a week    Attends Religious Services: Never    Database administrator or Organizations: No    Attends Engineer, structural: Not on file    Marital Status: Divorced    Review of Systems As above  Vital Signs: BP 126/72   Pulse 66   Temp 98 F (36.7 C) (Oral)   Resp 17   SpO2 96%   Physical Exam  General: WN, NAD  CV: RRR on monitor Pulm: normal work of breathing on RA Abd: S, ND, NT MSK: Grossly normal Psych: Appropriate affect.   Imaging:  DG Cervical Spine Complete Result Date: 07/10/2023 CLINICAL DATA:  Neck pain. EXAM: CERVICAL SPINE - COMPLETE 4+ VIEW COMPARISON:  None Available. FINDINGS: There is no evidence of cervical spine fracture or prevertebral soft tissue swelling. Straightening of cervical spine. Multilevel mild degenerative joint changes of  cervical spine with narrow intervertebral space, anterior spurring. No other significant bone abnormalities are identified. IMPRESSION: Mild degenerative joint changes of cervical spine. Electronically Signed   By: Sherian Rein M.D.   On: 07/10/2023 16:31   IR Radiologist Eval & Mgmt Result Date: 07/10/2023 EXAM: NEW PATIENT OFFICE VISIT CHIEF COMPLAINT: See below HISTORY OF PRESENT ILLNESS: See below REVIEW OF SYSTEMS: See below PHYSICAL EXAMINATION: See below ASSESSMENT AND PLAN: Please refer to completed note in the electronic medical record on [place] [facility] Roanna Banning, MD [facility] Radiology Electronically Signed   By: Roanna Banning M.D.   On: 07/10/2023 13:50    Labs:  CBC: Recent Labs    01/02/23 1031 03/15/23 1412  WBC 5.1 5.8  HGB 16.6 15.8  HCT 50.9 46.8  PLT 208.0 203    COAGS: No results for input(s): "INR", "APTT" in the last 8760 hours.  BMP: Recent Labs    01/02/23 1031 03/15/23 1412  NA 140 137  K 4.2 4.1  CL 105 107  CO2 25 23  GLUCOSE 84 96  BUN 25* 21  CALCIUM 9.0 9.0  CREATININE 0.78 0.78  GFRNONAA  --  >60   Assessment and Plan:  68 y/o M w PMHx significant for former smoking (20PY), chronic back pain, BPH and long standing LUTS s/p prior Rezum treatment (2021).   Prostatomegaly  on Cystoscopy. [place] Prostate pending. PSA, last 0.98 (01/02/23) IPSS Score Total, 25 points (Severe symptoms) QoL "terrible", on alfuzosin 10 mg QD and dutasteride 0.5 mg QD.   Given the nature of the disease, I felt that the patient would benefit from Prostate Artery Embolization (PAE).  The procedure has been fully reviewed with the patient/patient's authorized representative. The risks, benefits and alternatives of PAE were fully discussed including technique, potential complications, timeline of improvement, efficacy and durability. The patient/patient's authorized representative has consented to the procedure.   *Will  order CTA Pelvis for procedural planning *OK to proceed to schedule PAE on mutual availability.   *Same day procedure, no overnight admission. Procedure to be performed at [facility] *Pt habitus < 6 ft, will plan for trans-radial access. *Rocephin for pre op Abx *CBC, BMP, Coags + PSA to be drawn  on procedure day  Thank you for this interesting consult.  I greatly enjoyed meeting Alain Deschene and look forward to participating in their care.  A copy of this report was sent to the requesting provider on this date.  Electronically Signed:  Roanna Banning, MD [facility] Radiology   Pager. [phone] Clinic. [phone]  I spent a total of 45 Minutes  in face to face in clinical consultation, greater than 50% of which was counseling/coordinating care for Mr Kaci Freel' BPH and urinary retention

## 2023-07-15 ENCOUNTER — Encounter: Payer: Self-pay | Admitting: Family Medicine

## 2023-07-15 DIAGNOSIS — G8929 Other chronic pain: Secondary | ICD-10-CM

## 2023-07-16 ENCOUNTER — Other Ambulatory Visit: Payer: Self-pay | Admitting: Interventional Radiology

## 2023-07-16 DIAGNOSIS — N400 Enlarged prostate without lower urinary tract symptoms: Secondary | ICD-10-CM

## 2023-07-16 NOTE — Telephone Encounter (Signed)
 Yes he should mention this to Dr. Drucie Ip. He will likely ask [place] to order an MRI of his cervical spine. Let's see what he says

## 2023-07-18 DIAGNOSIS — M47816 Spondylosis without myelopathy or radiculopathy, lumbar region: Secondary | ICD-10-CM | POA: Diagnosis not present

## 2023-07-23 MED ORDER — METHYLPREDNISOLONE 4 MG PO TBPK: ORAL_TABLET | ORAL | 0 refills | Status: DC

## 2023-07-23 NOTE — Telephone Encounter (Signed)
 I sent in another Medrol dose pack, and I ordered a cervical spine MRI

## 2023-07-25 ENCOUNTER — Ambulatory Visit
Admission: RE | Admit: 2023-07-25 | Discharge: 2023-07-25 | Disposition: A | Discharge status: Home / Self Care | Source: Ambulatory Visit | Attending: Interventional Radiology | Admitting: Interventional Radiology

## 2023-07-25 DIAGNOSIS — N401 Enlarged prostate with lower urinary tract symptoms: Secondary | ICD-10-CM | POA: Diagnosis not present

## 2023-07-25 DIAGNOSIS — K5730 Diverticulosis of large intestine without perforation or abscess without bleeding: Secondary | ICD-10-CM | POA: Diagnosis not present

## 2023-07-25 DIAGNOSIS — I771 Stricture of artery: Secondary | ICD-10-CM | POA: Diagnosis not present

## 2023-07-25 DIAGNOSIS — N400 Enlarged prostate without lower urinary tract symptoms: Secondary | ICD-10-CM

## 2023-07-25 DIAGNOSIS — N3289 Other specified disorders of bladder: Secondary | ICD-10-CM | POA: Diagnosis not present

## 2023-07-25 MED ORDER — IOPAMIDOL (ISOVUE-370) INJECTION 76%
100.0000 mL | Freq: Once | INTRAVENOUS | Status: AC | PRN
  Administered 2023-07-25: 100 mL via INTRAVENOUS

## 2023-07-31 ENCOUNTER — Other Ambulatory Visit (HOSPITAL_COMMUNITY): Payer: Self-pay | Admitting: Interventional Radiology

## 2023-07-31 ENCOUNTER — Encounter: Payer: Self-pay | Admitting: Family Medicine

## 2023-07-31 DIAGNOSIS — N400 Enlarged prostate without lower urinary tract symptoms: Secondary | ICD-10-CM

## 2023-08-02 NOTE — Telephone Encounter (Signed)
 I will not do a "peer to peer". I suggest we do a referral to someone like Neurosurgery to evaluate him. Then they can order a scan if needed

## 2023-08-06 ENCOUNTER — Encounter: Payer: Self-pay | Admitting: Family Medicine

## 2023-08-08 ENCOUNTER — Other Ambulatory Visit

## 2023-08-14 DIAGNOSIS — F411 Generalized anxiety disorder: Secondary | ICD-10-CM | POA: Diagnosis not present

## 2023-08-28 ENCOUNTER — Other Ambulatory Visit: Payer: Self-pay | Admitting: Radiology

## 2023-08-28 DIAGNOSIS — Z01818 Encounter for other preprocedural examination: Secondary | ICD-10-CM

## 2023-08-28 DIAGNOSIS — N138 Other obstructive and reflux uropathy: Secondary | ICD-10-CM

## 2023-08-28 NOTE — H&P (Signed)
 Chief Complaint: BPH with LUTS  Referring Provider(s): Yevonne Heman  Supervising Physician: Art Largo  Patient Status: [facility] - Out-pt  History of Present Illness: Michael Garner is a 68 y.o. male  with medical issues including long term smoking (20PY), chronic back pain, BPH and long standing LUTS s/p prior Rezum treatment (2021).   He is closely followed by Urology and reportedly d/c'd initial trial of alpha blockade secondary to retrograde ejaculation.   His symptoms have persisted vs worsened, with increasing urinary urgency, nocturia up to 5X/night and dribbling post urination.    He is on medical management with alfuzosin  10 mg QD and dutasteride  0.5 mg QD.   He is here today for prostate artery embolization.  He is NPO.   ROS positive for OED on cialis  and testosterone  supplementation. He denies hematuria.   Patient is Full Code  Past Medical History:  Diagnosis Date   A-fib (HCC)    Arthritis 2020   in hands   BPH with urinary obstruction    Dysrhythmia    Erectile dysfunction    Hepatitis B infection    resolved    Hypogonadism, male    Pneumonia 2021   hospitalized   Pulmonary edema 03/05/2020   Recurrent upper respiratory infection (URI)    Sleep apnea, obstructive    sees Dr. Gaylyn Keas    Past Surgical History:  Procedure Laterality Date   ATRIAL FIBRILLATION ABLATION N/A 03/04/2020   Procedure: ATRIAL FIBRILLATION ABLATION;  Surgeon: Lei Pump, MD;  Location: [facility] IN[facility];  Service: Cardiovascular;  Laterality: N/A;   CARDIOVERSION  05/16/2018   COLONOSCOPY  10/13/2021   per Dr. Cherryl Corona, adenomatous polyps, repeat in 3 yrs   IR RADIOLOGIST EVAL & MGMT  07/10/2023   LUMBAR LAMINECTOMY/DECOMPRESSION MICRODISCECTOMY  2018   LUMBAR LAMINECTOMY/DECOMPRESSION MICRODISCECTOMY N/A 02/06/2021   Procedure: LAMINECTOMY, FORAMINOTOMY LUMBAR TWO-THREE, LUMBAR THREE-FOUR; LEFT LUBMAR TWO-THREE DISCECTOMY;  Surgeon: Garry Kansas, MD;  Location: [facility] OR;  Service: Neurosurgery;  Laterality: N/A;  LAMINECTOMY, FORAMINOTOMY LUMBAR TWO-THREE, LUMBAR THREE-FOUR; LEFT LUBMAR TWO-THREE DISCECTOMY   LUMBAR MICRODISCECTOMY  2002   NASAL SEPTOPLASTY W/ TURBINOPLASTY Bilateral 04/22/2023   Procedure: NASAL SEPTOPLASTY WITH TURBINATE REDUCTION;  Surgeon: Janita Mellow, MD;  Location: [facility];  Service: ENT;  Laterality: Bilateral;    Allergies: Dextromethorphan-guaifenesin , Dextromethorphan, Penicillins, and Prednisone  Medications: Prior to Admission medications   Medication Sig Start Date End Date Taking? Authorizing Provider  acetaminophen  (TYLENOL ) 500 MG tablet Take 500 mg by mouth in the morning and at bedtime. 09/10/19   [provider]  alfuzosin  (UROXATRAL ) 10 MG 24 hr tablet TAKE 1 TABLET BY MOUTH DAILY WITH BREAKFAST 04/12/23   Donley Furth, MD  ALPRAZolam  (XANAX ) 0.5 MG tablet TAKE ONE TABLET BY MOUTH TWICE A DAY AS NEEDED FOR ANXIETY 05/28/23   Donley Furth, MD  Cyanocobalamin  (B-12 IJ) Inject as directed once a week.    [provider]  diltiazem  (CARDIZEM  CD) 360 MG 24 hr capsule Take 1 capsule (360 mg total) by mouth daily. 04/29/23   Camnitz, Babetta Lesch, MD  dutasteride  (AVODART ) 0.5 MG capsule TAKE ONE CAPSULE BY MOUTH DAILY 10/10/22   Donley Furth, MD  ibuprofen (ADVIL) 400 MG tablet Take 400 mg by mouth as needed. 09/10/19   [provider]  meloxicam  (MOBIC ) 15 MG tablet TAKE 1 TABLET BY MOUTH DAILY 11/13/22   Donley Furth, MD  methylPREDNISolone  (MEDROL  DOSEPAK) 4 MG TBPK tablet  As directed 07/23/23   Donley Furth, MD  mometasone (NASONEX) 50 MCG/ACT nasal spray Place 1 spray into the nose daily.    [provider]  Multiple Vitamins-Minerals (MULTIVITAMIN WITH MINERALS) tablet Take 1 tablet by mouth daily.    [provider]  SYRINGE-NEEDLE, DISP, 3 ML (LUER LOCK SAFETY SYRINGES) 21G X 1-1/2" 3 ML MISC 1 application  by Does not apply route  every 14 (fourteen) days. 06/18/23   Donley Furth, MD  tadalafil  (CIALIS ) 20 MG tablet Take 1 tablet (20 mg total) by mouth as needed for erectile dysfunction. 03/19/22 06/10/33  Donley Furth, MD  temazepam  (RESTORIL ) 30 MG capsule Take 1 capsule (30 mg total) by mouth at bedtime as needed for sleep. 04/24/23   Donley Furth, MD  testosterone  cypionate (DEPOTESTOSTERONE CYPIONATE) 200 MG/ML injection Inject 1 mL (200 mg total) into the muscle every 14 (fourteen) days. 06/18/23   Donley Furth, MD     Family History  Problem Relation Age of Onset   Allergic rhinitis Father    Alcohol abuse Father    Lung cancer Father    Allergic rhinitis Brother    Colon cancer Neg Hx    Colon polyps Neg Hx    Esophageal cancer Neg Hx    Stomach cancer Neg Hx    Rectal cancer Neg Hx     Social History   Socioeconomic History   Marital status: Divorced    Spouse name: Not on file   Number of children: Not on file   Years of education: Not on file   Highest education level: Bachelor's degree (e.g., BA, AB, BS)  Occupational History   Not on file  Tobacco Use   Smoking status: Former    Current packs/day: 0.00    Types: Cigarettes    Quit date: 2002    Years since quitting: 23.4   Smokeless tobacco: Never  Vaping Use   Vaping status: Never Used  Substance and Sexual Activity   Alcohol use: Yes    Alcohol/week: 10.0 standard drinks of alcohol    Types: 10 Cans of beer per week    Comment: "couple of beers after work every night"   Drug use: Yes    Types: Marijuana   Sexual activity: Not on file  Other Topics Concern   Not on file  Social History Narrative   Not on file   Social Drivers of Health   Financial Resource Strain: Low Risk  (07/10/2023)   Overall Financial Resource Strain (CARDIA)    Difficulty of Paying Living Expenses: Not hard at all  Food Insecurity: No Food Insecurity (07/10/2023)   Hunger Vital Sign    Worried About Running Out of Food in the Last Year: Never true     Ran Out of Food in the Last Year: Never true  Transportation Needs: No Transportation Needs (07/10/2023)   PRAPARE - Administrator, Civil Service (Medical): No    Lack of Transportation (Non-Medical): No  Physical Activity: Insufficiently Active (07/10/2023)   Exercise Vital Sign    Days of Exercise per Week: 2 days    Minutes of Exercise per Session: 70 min  Stress: No Stress Concern Present (07/10/2023)   [employer] of Occupational Health - Occupational Stress Questionnaire    Feeling of Stress : Not at all  Social Connections: Socially Isolated (07/10/2023)   Social Connection and Isolation Panel [NHANES]    Frequency of Communication with Friends and Family: Once  a week    Frequency of Social Gatherings with Friends and Family: Once a week    Attends Religious Services: Never    Database administrator or Organizations: No    Attends Engineer, structural: Not on file    Marital Status: Divorced     Review of Systems: A 12 point ROS discussed and pertinent positives are indicated in the HPI above.  All other systems are negative.  Review of Systems  Vital Signs: BP (!) 141/89   Pulse 75   Temp 97.9 F (36.6 C) (Oral)   Resp 16   Ht 5\' 11"  (1.803 m)   Wt 217 lb (98.4 kg)   SpO2 95%   BMI 30.27 kg/m   Advance Care Plan: The advanced care place/surrogate decision maker was discussed at the time of visit and the patient did not wish to discuss or was not able to name a surrogate decision maker or provide an advance care plan.  Physical Exam Vitals reviewed.  Constitutional:      Appearance: Normal appearance.  HENT:     Head: Normocephalic and atraumatic.  Eyes:     Extraocular Movements: Extraocular movements intact.  Cardiovascular:     Rate and Rhythm: Normal rate and regular rhythm.  Pulmonary:     Effort: Pulmonary effort is normal. No respiratory distress.     Breath sounds: Normal breath sounds.  Abdominal:     General: There is no  distension.     Palpations: Abdomen is soft.     Tenderness: There is no abdominal tenderness.  Musculoskeletal:        General: Normal range of motion.     Cervical back: Normal range of motion.  Skin:    General: Skin is warm and dry.  Neurological:     General: No focal deficit present.     Mental Status: He is alert and oriented to person, place, and time.  Psychiatric:        Mood and Affect: Mood normal.        Behavior: Behavior normal.        Thought Content: Thought content normal.        Judgment: Judgment normal.     Imaging:  CLINICAL DATA:  Benign prostatic hypertrophy with lower urinary tract symptoms. Preoperative evaluation prior to prostatic artery embolization.   EXAM: CT ANGIOGRAPHY PELVIS   TECHNIQUE: Multidetector CT imaging of the pelvis was performed using the standard protocol during bolus administration of intravenous contrast. Multiplanar reconstructed images and MIPs were obtained and reviewed to evaluate the vascular anatomy.   RADIATION DOSE REDUCTION: This exam was performed according to the departmental dose-optimization program which includes automated exposure control, adjustment of the mA and/or kV according to patient size and/or use of iterative reconstruction technique.   CONTRAST:  100mL ISOVUE -370 IOPAMIDOL  (ISOVUE -370) INJECTION 76%   COMPARISON:  None Available.   FINDINGS: VASCULAR   Aorta: The visualized distal aorta is normal caliber. No evidence of aneurysm or dissection. Trace fleck of calcified atherosclerotic plaque visible.   IMA: Visualized portion is patent and unremarkable.   Inflow: Moderate tortuosity with scattered atherosclerotic plaque. No significant stenosis, dissection or aneurysm. On the right, the anterior division trifurcate early into several small branches including the pudendal artery which gives rise to the prostatic artery from its proximal aspect. The inferior gluteal and obturator arteries  both arise from the posterior division. On the left, there is a more normal appearing anterior division. The prostatic  artery arises from a common pudendal obturator trunk.   Veins: No focal venous abnormality.   Review of the MIP images confirms the above findings.   NON-VASCULAR   Adrenals/Urinary Tract: Visualized lower kidneys are unremarkable. The ureters are unremarkable. The bladder demonstrates mild wall trabeculation consistent with chronic bladder outlet obstruction.   Stomach/Bowel: Colonic diverticular disease without CT evidence of active inflammation. No mass or obstruction visualized. Normal appendix.   Lymphatic: No suspicious lymphadenopathy.   Other: None.  No ascites.   Musculoskeletal: Multilevel degenerative disc disease and facet arthropathy.   IMPRESSION: 1. Moderately tortuous iliac systems bilaterally with mild scattered atherosclerotic plaque but no significant stenosis, dissection or aneurysm. 2. Variant anatomy on the right. The operator and inferior gluteal arteries arise from the posterior division. The anterior division bifurcates early into several branches including the internal pudendal artery which then gives rise to the prostatic artery. 3. More conventional anterior division anatomy on the left. The prostatic artery appears to arise from the internal pudendal artery which arises jointly from a pudendal/obturator trunk. 4. Colonic diverticular disease without CT evidence of active inflammation. 5. Mild bladder wall trabeculation consistent with chronic bladder outlet obstruction. 6. Lower lumbar degenerative disc disease and facet arthropathy.     Electronically Signed   By: Fernando Hoyer M.D.   On: 07/28/2023 14:10  Labs:  CBC: Recent Labs    01/02/23 1031 03/15/23 1412 08/29/23 1112  WBC 5.1 5.8 7.4  HGB 16.6 15.8 16.2  HCT 50.9 46.8 48.0  PLT 208.0 203 225    COAGS: Recent Labs    08/29/23 1112  INR 1.0     BMP: Recent Labs    01/02/23 1031 03/15/23 1412  NA 140 137  K 4.2 4.1  CL 105 107  CO2 25 23  GLUCOSE 84 96  BUN 25* 21  CALCIUM 9.0 9.0  CREATININE 0.78 0.78  GFRNONAA  --  >60    LIVER FUNCTION TESTS: Recent Labs    01/02/23 1031 03/15/23 1412  BILITOT 1.0 0.7  AST 31 28  ALT 24 26  ALKPHOS 77 71  PROT 7.0 7.0  ALBUMIN 4.5 4.4    TUMOR MARKERS: No results for input(s): "AFPTM", "CEA", "CA199", "CHROMGRNA" in the last 8760 hours.  Assessment and Plan:  Symptomatic BPH  Will proceed with prostate artery embolism today by Dr. Darylene Epley.  The Risks and benefits of prostate artery embolization were discussed with the patient including, but not limited to bleeding, infection, vascular injury, post operative pain, or contrast induced renal failure.  This procedure involves the use of X-rays and because of the nature of the planned procedure, it is possible that we will have prolonged use of X-ray fluoroscopy.  Potential radiation risks to you include (but are not limited to) the following: - A slightly elevated risk for cancer several years later in life. This risk is typically less than 0.5% percent. This risk is low in comparison to the normal incidence of human cancer, which is 33% for women and 50% for men according to the [employer] Society. - Radiation induced injury can include skin redness, resembling a rash, tissue breakdown / ulcers and hair loss (which can be temporary or permanent).   The likelihood of either of these occurring depends on the difficulty of the procedure and whether you are sensitive to radiation due to previous procedures, disease, or genetic conditions.   IF your procedure requires a prolonged use of radiation, you will be notified and given  written instructions for further action.  It is your responsibility to monitor the irradiated area for the 2 weeks following the procedure and to notify your physician if you are concerned that  you have suffered a radiation induced injury.    All of the patient's questions were answered, patient is agreeable to proceed. Consent signed and in chart.   Electronically Signed: Connor Deiters, PA-C   08/29/2023, 11:58 AM      I spent a total of    15 Minutes in face to face in clinical consultation, greater than 50% of which was counseling/coordinating care for Prostate embo.

## 2023-08-29 ENCOUNTER — Ambulatory Visit (HOSPITAL_COMMUNITY)
Admission: RE | Admit: 2023-08-29 | Discharge: 2023-08-29 | Disposition: A | Discharge status: Home / Self Care | Source: Ambulatory Visit | Attending: Interventional Radiology | Admitting: Interventional Radiology

## 2023-08-29 ENCOUNTER — Other Ambulatory Visit (HOSPITAL_COMMUNITY): Payer: Self-pay | Admitting: Interventional Radiology

## 2023-08-29 ENCOUNTER — Encounter (HOSPITAL_COMMUNITY): Payer: Self-pay

## 2023-08-29 ENCOUNTER — Other Ambulatory Visit: Payer: Self-pay | Admitting: Physician Assistant

## 2023-08-29 ENCOUNTER — Other Ambulatory Visit (HOSPITAL_COMMUNITY)

## 2023-08-29 ENCOUNTER — Other Ambulatory Visit: Payer: Self-pay

## 2023-08-29 DIAGNOSIS — R3915 Urgency of urination: Secondary | ICD-10-CM | POA: Insufficient documentation

## 2023-08-29 DIAGNOSIS — N401 Enlarged prostate with lower urinary tract symptoms: Secondary | ICD-10-CM | POA: Insufficient documentation

## 2023-08-29 DIAGNOSIS — R351 Nocturia: Secondary | ICD-10-CM | POA: Insufficient documentation

## 2023-08-29 DIAGNOSIS — I771 Stricture of artery: Secondary | ICD-10-CM | POA: Diagnosis not present

## 2023-08-29 DIAGNOSIS — Z87891 Personal history of nicotine dependence: Secondary | ICD-10-CM | POA: Insufficient documentation

## 2023-08-29 DIAGNOSIS — Z79899 Other long term (current) drug therapy: Secondary | ICD-10-CM | POA: Insufficient documentation

## 2023-08-29 DIAGNOSIS — Z01818 Encounter for other preprocedural examination: Secondary | ICD-10-CM

## 2023-08-29 DIAGNOSIS — N400 Enlarged prostate without lower urinary tract symptoms: Secondary | ICD-10-CM

## 2023-08-29 DIAGNOSIS — Z791 Long term (current) use of non-steroidal anti-inflammatories (NSAID): Secondary | ICD-10-CM | POA: Insufficient documentation

## 2023-08-29 DIAGNOSIS — N138 Other obstructive and reflux uropathy: Secondary | ICD-10-CM

## 2023-08-29 HISTORY — PX: IR ANGIOGRAM SELECTIVE EACH ADDITIONAL VESSEL: IMG667

## 2023-08-29 HISTORY — PX: IR 3D INDEPENDENT WKST: IMG2385

## 2023-08-29 HISTORY — PX: IR EMBO TUMOR ORGAN ISCHEMIA INFARCT INC GUIDE ROADMAPPING: IMG5449

## 2023-08-29 HISTORY — PX: IR ANGIOGRAM PELVIS SELECTIVE OR SUPRASELECTIVE: IMG661

## 2023-08-29 HISTORY — PX: IR US GUIDE VASC ACCESS LEFT: IMG2389

## 2023-08-29 LAB — CBC
HCT: 48 % (ref 39.0–52.0)
Hemoglobin: 16.2 g/dL (ref 13.0–17.0)
MCH: 31 pg (ref 26.0–34.0)
MCHC: 33.8 g/dL (ref 30.0–36.0)
MCV: 92 fL (ref 80.0–100.0)
Platelets: 225 10*3/uL (ref 150–400)
RBC: 5.22 MIL/uL (ref 4.22–5.81)
RDW: 13.9 % (ref 11.5–15.5)
WBC: 7.4 10*3/uL (ref 4.0–10.5)
nRBC: 0 % (ref 0.0–0.2)

## 2023-08-29 LAB — BASIC METABOLIC PANEL WITH GFR
Anion gap: 7 (ref 5–15)
BUN: 31 mg/dL — ABNORMAL HIGH (ref 8–23)
CO2: 24 mmol/L (ref 22–32)
Calcium: 9.1 mg/dL (ref 8.9–10.3)
Chloride: 107 mmol/L (ref 98–111)
Creatinine, Ser: 0.77 mg/dL (ref 0.61–1.24)
GFR, Estimated: >60 mL/min (ref >60)
Glucose, Bld: 87 mg/dL (ref 70–99)
Potassium: 4.5 mmol/L (ref 3.5–5.1)
Sodium: 138 mmol/L (ref 135–145)

## 2023-08-29 LAB — PROTIME-INR
INR: 1 (ref 0.8–1.2)
Prothrombin Time: 13.6 s (ref 11.4–15.2)

## 2023-08-29 LAB — PSA: Prostatic Specific Antigen: 0.95 ng/mL (ref 0.00–4.00)

## 2023-08-29 MED ORDER — FENTANYL CITRATE (PF) 100 MCG/2ML IJ SOLN
INTRAMUSCULAR | Status: AC
  Filled 2023-08-29: qty 2

## 2023-08-29 MED ORDER — VERAPAMIL HCL 2.5 MG/ML IV SOLN
INTRAVENOUS | Status: AC
  Filled 2023-08-29: qty 2

## 2023-08-29 MED ORDER — SOLIFENACIN SUCCINATE 5 MG PO TABS: 5.0000 mg | ORAL_TABLET | Freq: Every day | ORAL | 0 refills | Status: AC

## 2023-08-29 MED ORDER — CIPROFLOXACIN HCL 500 MG PO TABS: 500.0000 mg | ORAL_TABLET | Freq: Two times a day (BID) | ORAL | 0 refills | Status: AC

## 2023-08-29 MED ORDER — KETOROLAC TROMETHAMINE 15 MG/ML IJ SOLN
INTRAMUSCULAR | Status: AC | PRN
  Administered 2023-08-29 (×2): 15 mg via INTRAVENOUS

## 2023-08-29 MED ORDER — SODIUM CHLORIDE 0.9% FLUSH: 3.0000 mL | INTRAVENOUS | Status: DC | PRN

## 2023-08-29 MED ORDER — IOHEXOL 300 MG/ML  SOLN
100.0000 mL | Freq: Once | INTRAMUSCULAR | Status: AC | PRN
  Administered 2023-08-29: 50 mL via INTRA_ARTERIAL

## 2023-08-29 MED ORDER — MIDAZOLAM HCL 2 MG/2ML IJ SOLN
INTRAMUSCULAR | Status: AC
  Filled 2023-08-29: qty 2

## 2023-08-29 MED ORDER — NITROGLYCERIN 1 MG/10 ML FOR IR/CATH LAB
INTRA_ARTERIAL | Status: AC | PRN
  Administered 2023-08-29: 200 ug via INTRA_ARTERIAL

## 2023-08-29 MED ORDER — MIDAZOLAM HCL 2 MG/2ML IJ SOLN
INTRAMUSCULAR | Status: AC
Start: 2023-08-29 — End: ?
  Filled 2023-08-29: qty 2

## 2023-08-29 MED ORDER — ONDANSETRON HCL 4 MG/2ML IJ SOLN: 4.0000 mg | Freq: Four times a day (QID) | INTRAMUSCULAR | Status: DC | PRN

## 2023-08-29 MED ORDER — HEPARIN SODIUM (PORCINE) 1000 UNIT/ML IJ SOLN
INTRAMUSCULAR | Status: AC | PRN
  Administered 2023-08-29: 3000 [IU] via INTRA_ARTERIAL

## 2023-08-29 MED ORDER — MIDAZOLAM HCL 2 MG/2ML IJ SOLN
INTRAMUSCULAR | Status: AC | PRN
Start: 2023-08-29 — End: 2023-08-29
  Administered 2023-08-29 (×3): .5 mg via INTRAVENOUS
  Administered 2023-08-29: 1 mg via INTRAVENOUS
  Administered 2023-08-29: .5 mg via INTRAVENOUS
  Administered 2023-08-29: 1 mg via INTRAVENOUS

## 2023-08-29 MED ORDER — SULFAMETHOXAZOLE-TRIMETHOPRIM 400-80 MG PO TABS
1.0000 | ORAL_TABLET | Freq: Once | ORAL | Status: DC
  Filled 2023-08-29: qty 1

## 2023-08-29 MED ORDER — OXYCODONE HCL 5 MG PO TABS: 10.0000 mg | ORAL_TABLET | ORAL | Status: DC | PRN

## 2023-08-29 MED ORDER — IOHEXOL 300 MG/ML  SOLN
50.0000 mL | Freq: Once | INTRAMUSCULAR | Status: AC | PRN
  Administered 2023-08-29: 17 mL via INTRA_ARTERIAL

## 2023-08-29 MED ORDER — NAPROXEN 250 MG PO TABS
500.0000 mg | ORAL_TABLET | Freq: Once | ORAL | Status: AC
  Administered 2023-08-29: 500 mg via ORAL
  Filled 2023-08-29: qty 2

## 2023-08-29 MED ORDER — KETOROLAC TROMETHAMINE 15 MG/ML IJ SOLN
INTRAMUSCULAR | Status: AC | PRN
  Administered 2023-08-29: 15 mg via INTRAVENOUS

## 2023-08-29 MED ORDER — SODIUM CHLORIDE 0.9 % IV SOLN
2.0000 g | Freq: Once | INTRAVENOUS | Status: AC
  Administered 2023-08-29: 2 g via INTRAVENOUS
  Filled 2023-08-29: qty 20

## 2023-08-29 MED ORDER — VERAPAMIL HCL 2.5 MG/ML IV SOLN
INTRAVENOUS | Status: AC | PRN
  Administered 2023-08-29: 2.5 mg via INTRA_ARTERIAL

## 2023-08-29 MED ORDER — LIDOCAINE HCL (PF) 1 % IJ SOLN
30.0000 mL | Freq: Once | INTRAMUSCULAR | Status: AC
  Administered 2023-08-29: 10 mL via INTRADERMAL

## 2023-08-29 MED ORDER — NITROGLYCERIN IN D5W 100-5 MCG/ML-% IV SOLN
INTRAVENOUS | Status: AC
Start: 2023-08-29 — End: ?
  Filled 2023-08-29: qty 250

## 2023-08-29 MED ORDER — PHENAZOPYRIDINE HCL 100 MG PO TABS: 100.0000 mg | ORAL_TABLET | Freq: Three times a day (TID) | ORAL | 0 refills | Status: AC

## 2023-08-29 MED ORDER — KETOROLAC TROMETHAMINE 30 MG/ML IJ SOLN
INTRAMUSCULAR | Status: AC
  Filled 2023-08-29: qty 1

## 2023-08-29 MED ORDER — HEPARIN SODIUM (PORCINE) 1000 UNIT/ML IJ SOLN
INTRAMUSCULAR | Status: AC
  Filled 2023-08-29: qty 10

## 2023-08-29 MED ORDER — LIDOCAINE HCL (PF) 1 % IJ SOLN
INTRAMUSCULAR | Status: AC
  Filled 2023-08-29: qty 30

## 2023-08-29 MED ORDER — SODIUM CHLORIDE 0.9 % IV SOLN: INTRAVENOUS | Status: DC

## 2023-08-29 MED ORDER — METHYLPREDNISOLONE 4 MG PO TBPK: ORAL_TABLET | ORAL | 0 refills | Status: DC

## 2023-08-29 MED ORDER — FENTANYL CITRATE (PF) 100 MCG/2ML IJ SOLN
INTRAMUSCULAR | Status: AC | PRN
  Administered 2023-08-29: 50 ug via INTRAVENOUS
  Administered 2023-08-29 (×3): 25 ug via INTRAVENOUS
  Administered 2023-08-29 (×2): 50 ug via INTRAVENOUS
  Administered 2023-08-29: 25 ug via INTRAVENOUS

## 2023-08-29 NOTE — Discharge Instructions (Signed)
 Please call Interventional Radiology clinic [phone] with any questions or concerns.  You may remove your dressing and shower tomorrow.  After the procedure, it is common to have: Increased frequency of urination Mild pressure or discomfort in the low belly Blood in the urine and/or painful urination for 7-14 days  Blood in the semen for 7-21 days Pressure, pain, or the urge to have a bowel movement when urinating and/or a small amount of blood in the stool for 4-7 days Low-grade fever (temperature less than 101.5 degrees Fahrenheit or 38.6 degrees Celsius) for 4-7 days Tenderness and/or bruising around the puncture site. Bruising can take 2-3 weeks to go away completely. Please call immediately if you feel a lump that is growing or varies with your pulse  Follow these instructions at home:  Medication: Do not use Aspirin or ibuprofen products, such as Advil or Motrin, as it may increase bleeding You may resume your usual medications as ordered by your doctor If your doctor prescribed antibiotics, take them as directed. Do not stop taking them just because you feel better  Eating and drinking: Drink plenty of liquids to keep your urine pale yellow You can resume your regular diet as directed by your doctor   Activity For procedures where we entered your artery from the top of your leg (groin):  Avoid strenuous activity, such as climbing long flights of stairs, for 24 hours after your procedure  No heavy lifting (greater than 15-20 pounds or 6-9 kg) for 7 days or until the puncture site heels Do not take baths, swim, or use a hot tub until your health care provider approves. Take showers only Keep all follow-up visits as told by your doctor  Care of the procedure site Follow instructions from your health care provider about how to take care of the puncture site.  Wash your hands with soap and water before you change your bandage (dressing). If soap and water are not available, use  hand sanitizer Change your dressing as told by your health care provider Leave stitches (sutures), skin glue, or adhesive strips in place. These skin closures may need to stay in place for 2 weeks or longer. If adhesive strip edges start to loosen and curl up, you may trim the loose edges. Do not remove adhesive strips completely unless your health care provider tells you to do that Check your puncture site every day for signs of infection. Check for: More redness, swelling, or pain Warmth/heat at puncture site Pus or a bad odor  Contact a health care provider if: You have a fever You have more redness, swelling, or pain around your incision You have more fluid or blood coming from your incision site Your incision feels warm to the touch You have pus or a bad smell coming from your incision You have a rash You have nausea, or you cannot eat or drink anything without vomiting  Get help right away if: You have trouble breathing You have chest pain You have severe pain in your abdomen, and it does not get better with medicine You have leg pain or leg swelling You feel dizzy, or you faint   Moderate Conscious Sedation-Care After  This sheet gives you information about how to care for yourself after your procedure. Your health care provider may also give you more specific instructions. If you have problems or questions, contact your health care provider.  After the procedure, it is common to have: Sleepiness for several hours. Impaired judgment for several hours.  Difficulty with balance. Vomiting if you eat too soon.  Follow these instructions at home:  Rest. Do not participate in activities where you could fall or become injured. Do not drive or use machinery. Do not drink alcohol. Do not take sleeping pills or medicines that cause drowsiness. Do not make important decisions or sign legal documents. Do not take care of children on your own.  Eating and drinking Follow the  diet recommended by your health care provider. Drink enough fluid to keep your urine pale yellow. If you vomit: Drink water, juice, or soup when you can drink without vomiting. Make sure you have little or no nausea before eating solid foods.  General instructions Take over-the-counter and prescription medicines only as told by your health care provider. Have a responsible adult stay with you for the time you are told. It is important to have someone help care for you until you are awake and alert. Do not smoke. Keep all follow-up visits as told by your health care provider. This is important.  Contact a health care provider if: You are still sleepy or having trouble with balance after 24 hours. You feel light-headed. You keep feeling nauseous or you keep vomiting. You develop a rash. You have a fever. You have redness or swelling around the IV site.  Get help right away if: You have trouble breathing. You have new-onset confusion at home.  This information is not intended to replace advice given to you by your health care provider. Make sure you discuss any questions you have with your healthcare provider.

## 2023-08-29 NOTE — Procedures (Signed)
 Vascular and Interventional Radiology Procedure Note  Patient: Michael Garner DOB: 1956/01/22 Medical Record Number: [id number] Note Date/Time: 08/29/23 1:41 PM   Performing Physician: Art Largo, MD Assistant(s): None  Diagnosis: BPH w LUTS   Procedure:  PELVIC ARTERIOGRAPHY PROSTATE ARTERY EMBOLIZATION   Anesthesia: Conscious Sedation Complications: None Estimated Blood Loss: Minimal Specimens: None  Findings:  - access via the LEFT radial artery. - Prostatomegaly with bilateral tortuous prostatic arteries.  - Successful 100-300 and 300-500 um microparticle embolization of bilateral prostatic arteries to stasis - Additional coil embolization of the prostatic artery origins.    Plan: - Post radial sheath removal precautions.   Final report to follow once all images are reviewed and compared with previous studies.  See detailed dictation with images in PACS. The patient tolerated the procedure well without incident or complication and was returned to Recovery in stable condition.    Art Largo, MD [facility]   Pager. [phone] Clinic. [phone]

## 2023-08-29 NOTE — Sedation Documentation (Signed)
 Unable to place foley catheter x2. 16 fr and 12 fr unsuccessful. Per Dr. Darylene Epley place condom catheter. Patient states " I can try and urinate prior to procedure start".

## 2023-08-30 ENCOUNTER — Ambulatory Visit: Payer: Self-pay | Admitting: Family Medicine

## 2023-08-30 NOTE — Telephone Encounter (Signed)
 This just means that the PSA is a good screening test for cancer but it is not perfect. That's why other tests need to be done to see if there is really cancer or not

## 2023-09-03 NOTE — Telephone Encounter (Signed)
 No the procedure notes are not visible in his chart

## 2023-09-03 NOTE — Telephone Encounter (Signed)
 I cannot read about the procedure since Dr. Sherrine Dolly has not sent any notes about it to me yet. I would think some bruising from the procedure would be normal

## 2023-09-05 ENCOUNTER — Encounter: Payer: Self-pay | Admitting: Family Medicine

## 2023-09-05 DIAGNOSIS — M1990 Unspecified osteoarthritis, unspecified site: Secondary | ICD-10-CM | POA: Diagnosis not present

## 2023-09-05 DIAGNOSIS — I4891 Unspecified atrial fibrillation: Secondary | ICD-10-CM | POA: Diagnosis not present

## 2023-09-05 DIAGNOSIS — F411 Generalized anxiety disorder: Secondary | ICD-10-CM | POA: Diagnosis not present

## 2023-09-05 DIAGNOSIS — K5900 Constipation, unspecified: Secondary | ICD-10-CM | POA: Diagnosis not present

## 2023-09-05 DIAGNOSIS — N529 Male erectile dysfunction, unspecified: Secondary | ICD-10-CM | POA: Diagnosis not present

## 2023-09-05 DIAGNOSIS — R030 Elevated blood-pressure reading, without diagnosis of hypertension: Secondary | ICD-10-CM | POA: Diagnosis not present

## 2023-09-05 DIAGNOSIS — M4800 Spinal stenosis, site unspecified: Secondary | ICD-10-CM | POA: Diagnosis not present

## 2023-09-05 DIAGNOSIS — Z87891 Personal history of nicotine dependence: Secondary | ICD-10-CM | POA: Diagnosis not present

## 2023-09-05 DIAGNOSIS — I771 Stricture of artery: Secondary | ICD-10-CM | POA: Diagnosis not present

## 2023-09-05 DIAGNOSIS — E669 Obesity, unspecified: Secondary | ICD-10-CM | POA: Diagnosis not present

## 2023-09-05 DIAGNOSIS — Z809 Family history of malignant neoplasm, unspecified: Secondary | ICD-10-CM | POA: Diagnosis not present

## 2023-09-05 DIAGNOSIS — Z791 Long term (current) use of non-steroidal anti-inflammatories (NSAID): Secondary | ICD-10-CM | POA: Diagnosis not present

## 2023-09-09 ENCOUNTER — Other Ambulatory Visit (HOSPITAL_COMMUNITY): Payer: Self-pay | Admitting: Radiology

## 2023-09-09 MED ORDER — SOLIFENACIN SUCCINATE 5 MG PO TABS: 5.0000 mg | ORAL_TABLET | Freq: Every day | ORAL | 0 refills | Status: AC

## 2023-09-09 MED ORDER — GABAPENTIN 100 MG PO CAPS: 100.0000 mg | ORAL_CAPSULE | Freq: Three times a day (TID) | ORAL | 3 refills | Status: DC

## 2023-09-09 NOTE — Telephone Encounter (Signed)
 I sent in a RX for Gabapentin. We can increase the dosage if this does not help

## 2023-09-10 DIAGNOSIS — M47816 Spondylosis without myelopathy or radiculopathy, lumbar region: Secondary | ICD-10-CM | POA: Diagnosis not present

## 2023-09-10 DIAGNOSIS — M47817 Spondylosis without myelopathy or radiculopathy, lumbosacral region: Secondary | ICD-10-CM | POA: Diagnosis not present

## 2023-09-11 ENCOUNTER — Other Ambulatory Visit (HOSPITAL_COMMUNITY)

## 2023-09-11 ENCOUNTER — Ambulatory Visit (HOSPITAL_COMMUNITY)

## 2023-09-11 ENCOUNTER — Other Ambulatory Visit: Payer: Self-pay | Admitting: Interventional Radiology

## 2023-09-11 DIAGNOSIS — N401 Enlarged prostate with lower urinary tract symptoms: Secondary | ICD-10-CM

## 2023-09-17 ENCOUNTER — Encounter: Payer: Self-pay | Admitting: Family Medicine

## 2023-09-17 ENCOUNTER — Ambulatory Visit (INDEPENDENT_AMBULATORY_CARE_PROVIDER_SITE_OTHER): Admitting: Family Medicine

## 2023-09-17 VITALS — BP 108/60 | HR 70 | Temp 97.7°F | Wt 227.0 lb

## 2023-09-17 DIAGNOSIS — S3021XA Contusion of penis, initial encounter: Secondary | ICD-10-CM

## 2023-09-17 DIAGNOSIS — S3121XA Laceration without foreign body of penis, initial encounter: Secondary | ICD-10-CM

## 2023-09-17 MED ORDER — LIDOCAINE 5 % EX OINT: 1.0000 | TOPICAL_OINTMENT | Freq: Four times a day (QID) | CUTANEOUS | 0 refills | Status: AC | PRN

## 2023-09-17 MED ORDER — MUPIROCIN 2 % EX OINT: 1.0000 | TOPICAL_OINTMENT | Freq: Two times a day (BID) | CUTANEOUS | 0 refills | Status: AC

## 2023-09-17 NOTE — Progress Notes (Signed)
   Subjective:    Patient ID: Michael Garner, male    DOB: 1955-07-30, 68 y.o.   MRN: [id number]  HPI Here for swelling and pain on the head of his penis. He had a PAE procedure (prostate artery embolization) per Dr. Nerissa Bannister on 08-29-23, and this involved inserting a catheter to his bladder. He said the catheters they tried at first were too large so they had to change to a smaller one. It was painful as they were inserting these. Apparently the procedure itself went well, but he has had discomfort since then.    Review of Systems  Constitutional: Negative.   Respiratory: Negative.    Cardiovascular: Negative.   Genitourinary:  Positive for penile pain and penile swelling. Negative for hematuria, penile discharge and testicular pain.       Objective:   Physical Exam Constitutional:      Appearance: Normal appearance.  Cardiovascular:     Rate and Rhythm: Normal rate and regular rhythm.     Pulses: Normal pulses.     Heart sounds: Normal heart sounds.  Pulmonary:     Effort: Pulmonary effort is normal.     Breath sounds: Normal breath sounds.  Genitourinary:    Comments: The penile glans is swollen and every tender at the meatus, and there is a small laceration just to the right of the meatus Neurological:     Mental Status: He is alert.           Assessment & Plan:  He has bruising and a small penile laceration from catheterization attempts. We will treat with Mupiricin ointment BID. On between he can apply 5% Lidocaine  ointment as needed.  Corita Diego, MD

## 2023-09-18 ENCOUNTER — Ambulatory Visit: Admitting: Family Medicine

## 2023-09-18 DIAGNOSIS — F411 Generalized anxiety disorder: Secondary | ICD-10-CM | POA: Diagnosis not present

## 2023-09-27 ENCOUNTER — Ambulatory Visit
Admission: RE | Admit: 2023-09-27 | Discharge: 2023-09-27 | Disposition: A | Discharge status: Home / Self Care | Source: Ambulatory Visit | Attending: Interventional Radiology | Admitting: Interventional Radiology

## 2023-09-27 DIAGNOSIS — N401 Enlarged prostate with lower urinary tract symptoms: Secondary | ICD-10-CM | POA: Diagnosis not present

## 2023-09-27 DIAGNOSIS — R339 Retention of urine, unspecified: Secondary | ICD-10-CM | POA: Diagnosis not present

## 2023-09-27 HISTORY — PX: IR RADIOLOGIST EVAL & MGMT: IMG5224

## 2023-09-27 NOTE — Addendum Note (Signed)
 Encounter addended by: Adam Holm on: 09/27/2023 3:20 PM  Actions taken: Imaging Exam ended, Charge Capture section accepted

## 2023-09-27 NOTE — Progress Notes (Signed)
 Reason for visit: BPH with LUTS. Prostate embolization.   Care Team(s) Primary Care: Michael Garnette LABOR, MD Urology:  Michael Lonni Righter, MD   History of Present Illness:   [name] is a 68 y.o. male w PMHx significant for former smoking (20PY), chronic back pain, BPH and long standing LUTS s/p prior Rezum treatment (2021). He is closely followed by Urology and reportedly d/c'd initial trial of alpha blockade secondary to retrograde ejaculation. His symptoms had persisted vs worsened, with increasing urinary urgency, nocturia up to 5X/night and dribbling post urination.    Pt was referred by his urologist and underwent prostate artery embolization on 08/29/23. He reported immediate discomfort on the day of procedure from foley catheterization attempts. He noticed expected swelling but denied much pelvic discomfort after the procedure. He noticed that his urinary symptoms have improved, however his penile tip developed ulceration that he saw his PCP for and had been managing successfully with topical ointment. He remains on medical management with alfuzosin  10 mg QD and dutasteride  0.5 mg every day.  I used a International Prostatism Symptom Score* (IPSS) Score to quantify his symptoms: 15 points, previously 25 (pre PAE)   *Michael Garner, Michael Garner, et al. The American Urological Association symptom index for benign prostatic hyperplasia. The Measurement Committee of the American Urological Association. J Urol [id number]; 851:8450.   Review of Systems: A 12 point ROS discussed and pertinent positives are indicated in the HPI above.  All other systems are negative.   Past Medical History:  Diagnosis Date   A-fib [facility])    Arthritis 2020   in hands   BPH with urinary obstruction    Dysrhythmia    Erectile dysfunction    Hepatitis B infection    resolved    Hypogonadism, male    Pneumonia 2021   hospitalized   Pulmonary edema 03/05/2020   Recurrent upper respiratory  infection (URI)    Sleep apnea, obstructive    sees Dr. Wilbert Garner    Past Surgical History:  Procedure Laterality Date   ATRIAL FIBRILLATION ABLATION N/A 03/04/2020   Procedure: ATRIAL FIBRILLATION ABLATION;  Surgeon: Michael Michael Lunger, MD;  Location: [facility];  Service: Cardiovascular;  Laterality: N/A;   CARDIOVERSION  05/16/2018   COLONOSCOPY  10/13/2021   per Dr. Stacia, adenomatous polyps, repeat in 3 yrs   IR 3D INDEPENDENT WKST  08/29/2023   IR 3D INDEPENDENT WKST  08/29/2023   IR ANGIOGRAM PELVIS SELECTIVE OR SUPRASELECTIVE  08/29/2023   IR ANGIOGRAM SELECTIVE EACH ADDITIONAL VESSEL  08/29/2023   IR ANGIOGRAM SELECTIVE EACH ADDITIONAL VESSEL  08/29/2023   IR EMBO TUMOR ORGAN ISCHEMIA INFARCT INC GUIDE ROADMAPPING  08/29/2023   IR RADIOLOGIST EVAL & MGMT  07/10/2023   IR RADIOLOGIST EVAL & MGMT  09/27/2023   IR US  GUIDE VASC ACCESS LEFT  08/29/2023   IR US  GUIDE VASC ACCESS LEFT  08/29/2023   IR US  GUIDE VASC ACCESS LEFT  08/29/2023   LUMBAR LAMINECTOMY/DECOMPRESSION MICRODISCECTOMY  2018   LUMBAR LAMINECTOMY/DECOMPRESSION MICRODISCECTOMY N/A 02/06/2021   Procedure: LAMINECTOMY, FORAMINOTOMY LUMBAR TWO-THREE, LUMBAR THREE-FOUR; LEFT LUBMAR TWO-THREE DISCECTOMY;  Surgeon: Michael Purchase, MD;  Location: [facility] OR;  Service: Neurosurgery;  Laterality: N/A;  LAMINECTOMY, FORAMINOTOMY LUMBAR TWO-THREE, LUMBAR THREE-FOUR; LEFT LUBMAR TWO-THREE DISCECTOMY   LUMBAR MICRODISCECTOMY  2002   NASAL SEPTOPLASTY W/ TURBINOPLASTY Bilateral 04/22/2023   Procedure: NASAL SEPTOPLASTY WITH TURBINATE REDUCTION;  Surgeon: Michael Oliphant, MD;  Location: [facility];  Service: ENT;  Laterality: Bilateral;    Allergies: Dextromethorphan-guaifenesin , Dextromethorphan, Penicillins, and Prednisone  Medications: Prior to Admission medications   Medication Sig Start Date End Date Taking? Authorizing Provider  acetaminophen  (TYLENOL ) 500 MG tablet Take 500 mg by mouth in the morning  and at bedtime. 09/10/19   [provider]  alfuzosin  (UROXATRAL ) 10 MG 24 hr tablet TAKE 1 TABLET BY MOUTH DAILY WITH BREAKFAST 04/12/23   Michael Garnette LABOR, MD  Cyanocobalamin  (B-12 IJ) Inject as directed once a week.    [provider]  diltiazem  (CARDIZEM  CD) 360 MG 24 hr capsule Take 1 capsule (360 mg total) by mouth daily. 04/29/23   Michael Garner, Michael Lunger, MD  dutasteride  (AVODART ) 0.5 MG capsule TAKE ONE CAPSULE BY MOUTH DAILY 10/10/22   Michael Garnette LABOR, MD  gabapentin  (NEURONTIN ) 100 MG capsule Take 1 capsule (100 mg total) by mouth 3 (three) times daily. 09/09/23   Michael Garnette LABOR, MD  ibuprofen (ADVIL) 400 MG tablet Take 400 mg by mouth as needed. 09/10/19   [provider]  lidocaine  (XYLOCAINE ) 5 % ointment Apply 1 Application topically 4 (four) times daily as needed (soreness). 09/17/23   Michael Garnette LABOR, MD  meloxicam  (MOBIC ) 15 MG tablet TAKE 1 TABLET BY MOUTH DAILY 11/13/22   Michael Garnette LABOR, MD  mometasone (NASONEX) 50 MCG/ACT nasal spray Place 1 spray into the nose daily.    [provider]  Multiple Vitamins-Minerals (MULTIVITAMIN WITH MINERALS) tablet Take 1 tablet by mouth daily.    [provider]  mupirocin  ointment (BACTROBAN ) 2 % Apply 1 Application topically 2 (two) times daily. 09/17/23   Michael Garnette LABOR, MD  SYRINGE-NEEDLE, DISP, 3 ML (LUER LOCK SAFETY SYRINGES) 21G X 1-1/2 3 ML MISC 1 application  by Does not apply route every 14 (fourteen) days. 06/18/23   Michael Garnette LABOR, MD  tadalafil  (CIALIS ) 20 MG tablet Take 1 tablet (20 mg total) by mouth as needed for erectile dysfunction. 03/19/22 06/10/33  Michael Garnette LABOR, MD  temazepam  (RESTORIL ) 30 MG capsule Take 1 capsule (30 mg total) by mouth at bedtime as needed for sleep. 04/24/23   Michael Garnette LABOR, MD  testosterone  cypionate (DEPOTESTOSTERONE CYPIONATE) 200 MG/ML injection Inject 1 mL (200 mg total) into the muscle every 14 (fourteen) days. 06/18/23   Michael Garnette LABOR, MD     Family History  Problem  Relation Age of Onset   Allergic rhinitis Father    Alcohol abuse Father    Lung cancer Father    Allergic rhinitis Brother    Colon cancer Neg Hx    Colon polyps Neg Hx    Esophageal cancer Neg Hx    Stomach cancer Neg Hx    Rectal cancer Neg Hx     Social History   Socioeconomic History   Marital status: Divorced    Spouse name: Not on file   Number of children: Not on file   Years of education: Not on file   Highest education level: Bachelor's degree (e.g., BA, AB, BS)  Occupational History   Not on file  Tobacco Use   Smoking status: Former    Current packs/day: 0.00    Types: Cigarettes    Quit date: 2002    Years since quitting: 23.5   Smokeless tobacco: Never  Vaping Use   Vaping status: Never Used  Substance and Sexual Activity   Alcohol use: Yes    Alcohol/week: 10.0 standard drinks of alcohol    Types: 10 Cans  of beer per week    Comment: couple of beers after work every night   Drug use: Yes    Types: Marijuana   Sexual activity: Not on file  Other Topics Concern   Not on file  Social History Narrative   Not on file   Social Drivers of Health   Financial Resource Strain: Low Risk  (07/10/2023)   Overall Financial Resource Strain (CARDIA)    Difficulty of Paying Living Expenses: Not hard at all  Food Insecurity: No Food Insecurity (07/10/2023)   Hunger Vital Sign    Worried About Running Out of Food in the Last Year: Never true    Ran Out of Food in the Last Year: Never true  Transportation Needs: No Transportation Needs (07/10/2023)   PRAPARE - Administrator, Civil Service (Medical): No    Lack of Transportation (Non-Medical): No  Physical Activity: Insufficiently Active (07/10/2023)   Exercise Vital Sign    Days of Exercise per Week: 2 days    Minutes of Exercise per Session: 70 min  Stress: No Stress Concern Present (07/10/2023)   [employer] of Occupational Health - Occupational Stress Questionnaire    Feeling of Stress : Not at  all  Social Connections: Socially Isolated (07/10/2023)   Social Connection and Isolation Panel    Frequency of Communication with Friends and Family: Once a week    Frequency of Social Gatherings with Friends and Family: Once a week    Attends Religious Services: Never    Database administrator or Organizations: No    Attends Engineer, structural: Not on file    Marital Status: Divorced     Vital Signs: BP (!) 148/74 (BP Location: Left Arm, Patient Position: Sitting, Cuff Size: Normal)   Pulse 63   Temp (!) 97.5 F (36.4 C)   Resp 16   SpO2 99%   Physical Exam  General: WN, NAD  CV: RRR on monitor Pulm: normal work of breathing on RA Abd: S, ND, NT GU: induration at R tip of the penile gland, with well-healing shallow ulceration MSK: Grossly normal Psych: Appropriate affect.  Imaging:  PAE, 08/29/23 Bilateral prostatic artery microparticle embolization     No results found.  Labs:  CBC: Recent Labs    01/02/23 1031 03/15/23 1412 08/29/23 1112  WBC 5.1 5.8 7.4  HGB 16.6 15.8 16.2  HCT 50.9 46.8 48.0  PLT 208.0 203 225    COAGS: Recent Labs    08/29/23 1112  INR 1.0    BMP: Recent Labs    01/02/23 1031 03/15/23 1412 08/29/23 1112  NA 140 137 138  K 4.2 4.1 4.5  CL 105 107 107  CO2 25 23 24   GLUCOSE 84 96 87  BUN 25* 21 31*  CALCIUM 9.0 9.0 9.1  CREATININE 0.78 0.78 0.77  GFRNONAA  --  >60 >60    Latest Reference Range & Units 08/29/23 14:10  Prostatic Specific Antigen 0.00 - 4.00 ng/mL 0.95    Assessment and Plan:  68 y/o M w PMHx significant for former smoking (20PY), chronic back pain, BPH and long standing LUTS s/p prior Rezum treatment (2021).  Pt is s/p PAE on 08/29/23   Prostatomegaly  on Cystoscopy. PSA, last 0.98 (01/02/23) IPSS Score Total, 15 Pts, pre PAE 25 Pts QoL, improved. Remains on alfuzosin  10 mg QD and dutasteride  0.5 mg QD.  *Pt is happy with the prostate embolization procedural result and his current  urinary state.  *  Well healing R penile gland tip ulceration. Continue conservative mgmt w topical ointment. *No concern at this time. Follow up with VIR PRN. *Continue routine Urological follow up.  Electronically Signed:  Thom Hall, MD [facility] Radiology   Pager. [phone] Clinic. [phone]  I spent a total of 25 Minutes of face-to-face time in clinical consultation, greater than 50% of which was counseling/coordinating care for Mr Michael Garner BPH and urinary retention

## 2023-09-30 MED ORDER — GABAPENTIN 300 MG PO CAPS: 300.0000 mg | ORAL_CAPSULE | Freq: Three times a day (TID) | ORAL | 5 refills | Status: DC

## 2023-09-30 NOTE — Telephone Encounter (Signed)
 I went ahead and increased the dose to 300 mg, so he will still take one capsule TID

## 2023-10-01 ENCOUNTER — Telehealth: Payer: Self-pay

## 2023-10-01 NOTE — Telephone Encounter (Signed)
 Copied from CRM [id number]. Topic: Clinical - Medication Question >> Sep 27, 2023  4:18 PM Drema MATSU wrote: Reason for CRM: Patient wants to increase his dosage of Gabapentin . He is requesting that his new prescription be sent to the pharmacy. Pt Gabapentin  was increase on 09/30/23 by Dr Johnny, pt notified via MyChart

## 2023-10-13 NOTE — Addendum Note (Signed)
 Encounter addended by: Hughes Simmonds, MD on: 10/13/2023 5:29 PM  Actions taken: Clinical Note Signed

## 2023-10-15 ENCOUNTER — Ambulatory Visit: Payer: Self-pay

## 2023-10-15 NOTE — Telephone Encounter (Signed)
 FYI Only or Action Required?: FYI only for provider.  Patient was last seen in primary care on 09/17/2023 by Michael Garner LABOR, MD.  Called Nurse Triage reporting Foot Swelling.  Symptoms began several weeks ago.  Interventions attempted: Nothing.  Symptoms are: unchanged.  Triage Disposition: appointment scheduled for 7/10  Patient/caregiver understands and will follow disposition?:  Answer Assessment - Initial Assessment Questions 1. ONSET: When did the swelling start? (e.g., minutes, hours, days)     A couple of weeks ago 2. LOCATION: What part of the leg is swollen?  Are both legs swollen or just one leg?     Right greater than left, non pitting swelling in feet and ankles.  Noticed that his shoes were fitting tighter.  3. SEVERITY: How bad is the swelling? (e.g., localized; mild, moderate, severe)   - Localized: Small area of swelling localized to one leg.   - MILD pedal edema: Swelling limited to foot and ankle, pitting edema < 1/4 inch (6 mm) deep, rest and elevation eliminate most or all swelling.   - MODERATE edema: Swelling of lower leg to knee, pitting edema > 1/4 inch (6 mm) deep, rest and elevation only partially reduce swelling.   - SEVERE edema: Swelling extends above knee, facial or hand swelling present.      Mild pedal edema 4. REDNESS: Does the swelling look red or infected?     denies 5. PAIN: Is the swelling painful to touch? If Yes, ask: How painful is it?   (Scale 1-10; mild, moderate or severe)     Mild pain controlled by gabapentin  6. FEVER: Do you have a fever? If Yes, ask: What is it, how was it measured, and when did it start?      denies 7. CAUSE: What do you think is causing the leg swelling?     unsure 8. MEDICAL HISTORY: Do you have a history of blood clots (e.g., DVT), cancer, heart failure, kidney disease, or liver failure?     Ablation for a fib a couple of years ago 9. RECURRENT SYMPTOM: Have you had leg swelling before? If  Yes, ask: When was the last time? What happened that time?     denies 10. OTHER SYMPTOMS: Do you have any other symptoms? (e.g., chest pain, difficulty breathing)       denies  Protocols used: Leg Swelling and Edema-A-AH

## 2023-10-16 NOTE — Telephone Encounter (Signed)
 noted

## 2023-10-17 ENCOUNTER — Ambulatory Visit: Admitting: Family Medicine

## 2023-10-17 ENCOUNTER — Encounter: Payer: Self-pay | Admitting: Family Medicine

## 2023-10-17 VITALS — BP 118/64 | HR 70 | Temp 97.9°F | Wt 237.6 lb

## 2023-10-17 DIAGNOSIS — R600 Localized edema: Secondary | ICD-10-CM | POA: Diagnosis not present

## 2023-10-17 NOTE — Progress Notes (Signed)
   Subjective:    Patient ID: Michael Garner, male    DOB: Jun 01, 1955, 68 y.o.   MRN: [id number]  HPI Here complaining of swelling his feet and legs that started about a week ago. No SOB. He has never had this before. His last creatinine on 08-29-23 was normal at 0.77. His ECHO in 2023 showed an EF of 60-65% with normal diastolic function. He has been wearing compression stockings. Of note he was started on Gabapentin  about 2 months ago for back and joint pains, and this was increased to 300 mg TID a few weeks ago.    Review of Systems  Constitutional: Negative.   Respiratory: Negative.    Cardiovascular:  Positive for leg swelling. Negative for chest pain.  Musculoskeletal:  Positive for arthralgias and back pain.       Objective:   Physical Exam Constitutional:      Appearance: Normal appearance.  Cardiovascular:     Rate and Rhythm: Normal rate and regular rhythm.     Pulses: Normal pulses.     Heart sounds: Normal heart sounds.  Pulmonary:     Effort: Pulmonary effort is normal.     Breath sounds: Normal breath sounds.  Musculoskeletal:     Comments: 2+ edema in both feet and lower legs   Neurological:     Mental Status: He is alert.           Assessment & Plan:  His leg swelling is most likely a side effect of the Gabapentin , so he will stop taking this. The swelling should resove over the next few weeks. Recheck as needed.  Garnette Olmsted, MD

## 2023-10-22 MED ORDER — PREGABALIN 50 MG PO CAPS: 50.0000 mg | ORAL_CAPSULE | Freq: Three times a day (TID) | ORAL | 5 refills | Status: DC

## 2023-10-22 NOTE — Telephone Encounter (Signed)
 I sent in for him to try Lyrica  instead. Let us  know if the swelling comes back

## 2023-10-22 NOTE — Addendum Note (Signed)
 Addended by: JOHNNY SENIOR A on: 10/22/2023 03:54 PM   Modules accepted: Orders

## 2023-10-23 DIAGNOSIS — F411 Generalized anxiety disorder: Secondary | ICD-10-CM | POA: Diagnosis not present

## 2023-10-25 NOTE — Addendum Note (Signed)
 Encounter addended by: Jarold Nest on: 10/25/2023 11:07 AM  Actions taken: Imaging Exam ended

## 2023-11-11 MED ORDER — AMITRIPTYLINE HCL 25 MG PO TABS: 25.0000 mg | ORAL_TABLET | Freq: Every day | ORAL | 5 refills | Status: DC

## 2023-11-11 NOTE — Addendum Note (Signed)
 Addended by: JOHNNY SENIOR A on: 11/11/2023 02:24 PM   Modules accepted: Orders

## 2023-11-11 NOTE — Telephone Encounter (Signed)
 I have sent in a RX for Amitriptyline . This does not cause swelling.

## 2023-11-12 ENCOUNTER — Other Ambulatory Visit (HOSPITAL_COMMUNITY): Payer: Self-pay

## 2023-11-12 ENCOUNTER — Telehealth: Payer: Self-pay | Admitting: Pharmacy Technician

## 2023-11-12 NOTE — Telephone Encounter (Signed)
 Pharmacy Patient Advocate Encounter   Received notification from CoverMyMeds that prior authorization for Amitriptyline  HCl 25MG  tablets is required/requested.   Insurance verification completed.   The patient is insured through [facility] .   Per test claim: PA required; PA submitted to above mentioned insurance via CoverMyMeds Key/confirmation #/EOC [id number] Status is pending

## 2023-11-12 NOTE — Telephone Encounter (Signed)
 Pharmacy Patient Advocate Encounter  Received notification from [facility] that Prior Authorization for Amitriptyline  HCl 25MG  tablets has been DENIED.  Full denial letter will be uploaded to the media tab. See denial reason below.  **I could only find on the patient's chart that they have tried Zoloft.**    PA #/Case ID/Reference #: [id number]

## 2023-11-14 DIAGNOSIS — G4733 Obstructive sleep apnea (adult) (pediatric): Secondary | ICD-10-CM | POA: Diagnosis not present

## 2023-11-25 ENCOUNTER — Other Ambulatory Visit: Payer: Self-pay | Admitting: Family Medicine

## 2023-11-25 DIAGNOSIS — M48062 Spinal stenosis, lumbar region with neurogenic claudication: Secondary | ICD-10-CM

## 2023-11-27 DIAGNOSIS — F411 Generalized anxiety disorder: Secondary | ICD-10-CM | POA: Diagnosis not present

## 2023-12-12 DIAGNOSIS — M25571 Pain in right ankle and joints of right foot: Secondary | ICD-10-CM | POA: Diagnosis not present

## 2024-02-05 DIAGNOSIS — F411 Generalized anxiety disorder: Secondary | ICD-10-CM | POA: Diagnosis not present

## 2024-02-20 ENCOUNTER — Other Ambulatory Visit: Payer: Self-pay | Admitting: Family Medicine

## 2024-02-25 ENCOUNTER — Ambulatory Visit (INDEPENDENT_AMBULATORY_CARE_PROVIDER_SITE_OTHER)

## 2024-02-25 VITALS — Ht 71.0 in | Wt 225.0 lb

## 2024-02-25 DIAGNOSIS — Z0000 Encounter for general adult medical examination without abnormal findings: Secondary | ICD-10-CM

## 2024-02-25 NOTE — Patient Instructions (Addendum)
 Michael Garner,  Thank you for taking the time for your Medicare Wellness Visit. I appreciate your continued commitment to your health goals. Please review the care plan we discussed, and feel free to reach out if I can assist you further.  Please note that Annual Wellness Visits do not include a physical exam. Some assessments may be limited, especially if the visit was conducted virtually. If needed, we may recommend an in-person follow-up with your provider.  Ongoing Care Seeing your primary care provider every 3 to 6 months helps us  monitor your health and provide consistent, personalized care. Last office visit on 10/17/2023.  You are due for a Flu vaccine and Covid.  Remember to call office to get scheduled before the new year.  Keep up the good work.  Referrals If a referral was made during today's visit and you haven't received any updates within two weeks, please contact the referred provider directly to check on the status.  Recommended Screenings:  Health Maintenance  Topic Date Due   Flu Shot  11/08/2023   Screening for Lung Cancer  11/22/2023   COVID-19 Vaccine (7 - 2025-26 season) 12/09/2023   Colon Cancer Screening  10/13/2024   Medicare Annual Wellness Visit  02/24/2025   DTaP/Tdap/Td vaccine (2 - Td or Tdap) 01/02/2028   Pneumococcal Vaccine for age over 58  Completed   Hepatitis C Screening  Completed   Zoster (Shingles) Vaccine  Completed   Meningitis B Vaccine  Aged Out       02/25/2024   10:07 AM  Advanced Directives  Does Patient Have a Medical Advance Directive? No    Vision: Annual vision screenings are recommended for early detection of glaucoma, cataracts, and diabetic retinopathy. These exams can also reveal signs of chronic conditions such as diabetes and high blood pressure.  Dental: Annual dental screenings help detect early signs of oral cancer, gum disease, and other conditions linked to overall health, including heart disease and diabetes.  Please  see the attached documents for additional preventive care recommendations.

## 2024-02-25 NOTE — Progress Notes (Signed)
 Chief Complaint  Patient presents with   Medicare Wellness     Subjective:   Michael Garner is a 68 y.o. male who presents for a Medicare Annual Wellness Visit.  Allergies (verified) Dextromethorphan-guaifenesin , Dextromethorphan, Penicillins, and Prednisone   History: Past Medical History:  Diagnosis Date   A-fib (HCC)    Arthritis 2020   in hands   BPH with urinary obstruction    Dysrhythmia    Erectile dysfunction    Hepatitis B infection    resolved    Hypogonadism, male    Pneumonia 2021   hospitalized   Pulmonary edema 03/05/2020   Recurrent upper respiratory infection (URI)    Sleep apnea, obstructive    sees Dr. Wilbert Bihari   Past Surgical History:  Procedure Laterality Date   ATRIAL FIBRILLATION ABLATION N/A 03/04/2020   Procedure: ATRIAL FIBRILLATION ABLATION;  Surgeon: Inocencio Soyla Lunger, MD;  Location: [facility];  Service: Cardiovascular;  Laterality: N/A;   CARDIOVERSION  05/16/2018   COLONOSCOPY  10/13/2021   per Dr. Stacia, adenomatous polyps, repeat in 3 yrs   IR 3D INDEPENDENT WKST  08/29/2023   IR 3D INDEPENDENT WKST  08/29/2023   IR ANGIOGRAM PELVIS SELECTIVE OR SUPRASELECTIVE  08/29/2023   IR ANGIOGRAM SELECTIVE EACH ADDITIONAL VESSEL  08/29/2023   IR ANGIOGRAM SELECTIVE EACH ADDITIONAL VESSEL  08/29/2023   IR EMBO TUMOR ORGAN ISCHEMIA INFARCT INC GUIDE ROADMAPPING  08/29/2023   IR RADIOLOGIST EVAL & MGMT  07/10/2023   IR RADIOLOGIST EVAL & MGMT  09/27/2023   IR US  GUIDE VASC ACCESS LEFT  08/29/2023   IR US  GUIDE VASC ACCESS LEFT  08/29/2023   IR US  GUIDE VASC ACCESS LEFT  08/29/2023   LUMBAR LAMINECTOMY/DECOMPRESSION MICRODISCECTOMY  2018   LUMBAR LAMINECTOMY/DECOMPRESSION MICRODISCECTOMY N/A 02/06/2021   Procedure: LAMINECTOMY, FORAMINOTOMY LUMBAR TWO-THREE, LUMBAR THREE-FOUR; LEFT LUBMAR TWO-THREE DISCECTOMY;  Surgeon: Mavis Purchase, MD;  Location: [facility] OR;  Service: Neurosurgery;  Laterality: N/A;  LAMINECTOMY, FORAMINOTOMY LUMBAR  TWO-THREE, LUMBAR THREE-FOUR; LEFT LUBMAR TWO-THREE DISCECTOMY   LUMBAR MICRODISCECTOMY  2002   NASAL SEPTOPLASTY W/ TURBINOPLASTY Bilateral 04/22/2023   Procedure: NASAL SEPTOPLASTY WITH TURBINATE REDUCTION;  Surgeon: Jesus Oliphant, MD;  Location: [facility];  Service: ENT;  Laterality: Bilateral;   Family History  Problem Relation Age of Onset   Allergic rhinitis Father    Alcohol abuse Father    Lung cancer Father    Allergic rhinitis Brother    Colon cancer Neg Hx    Colon polyps Neg Hx    Esophageal cancer Neg Hx    Stomach cancer Neg Hx    Rectal cancer Neg Hx    Social History   Occupational History   Not on file  Tobacco Use   Smoking status: Former    Current packs/day: 0.00    Types: Cigarettes    Quit date: 2002    Years since quitting: 23.8   Smokeless tobacco: Never  Vaping Use   Vaping status: Never Used  Substance and Sexual Activity   Alcohol use: Yes    Alcohol/week: 10.0 standard drinks of alcohol    Types: 10 Cans of beer per week    Comment: couple of beers after work every night   Drug use: Yes    Types: Marijuana   Sexual activity: Not on file   Tobacco Counseling Counseling given: Not Answered  SDOH Screenings   Food Insecurity: No Food Insecurity (10/16/2023)  Housing: Low Risk  (10/16/2023)  Transportation  Needs: No Transportation Needs (10/16/2023)  Utilities: Low Risk  (07/20/2022)   Received from [facility]  Alcohol Screen: Low Risk  (10/16/2023)  Depression (PHQ2-9): Low Risk  (10/17/2023)  Financial Resource Strain: Low Risk  (10/16/2023)  Physical Activity: Insufficiently Active (10/16/2023)  Social Connections: Socially Isolated (10/16/2023)  Stress: No Stress Concern Present (10/16/2023)  Tobacco Use: Medium Risk (02/25/2024)   See flowsheets for full screening details  Depression Screen PHQ 2 & 9 Depression Scale- Over the past 2 weeks, how often have you been bothered by any of the following problems? Little interest or  pleasure in doing things: 0 Feeling down, depressed, or hopeless (PHQ Adolescent also includes...irritable): 0 PHQ-2 Total Score: 0     Goals Addressed   None    Visit info / Clinical Intake: Medicare Wellness Visit Type:: Subsequent Annual Wellness Visit Persons participating in visit:: patient Medicare Wellness Visit Mode:: Video Because this visit was a virtual/telehealth visit:: pt reported vitals If Telephone or Video please confirm:: I connected with the patient using audio enabled telemedicine application and verified that I am speaking with the correct person using two identifiers; I discussed the limitations of evaluation and management by telemedicine; The patient expressed understanding and agreed to proceed Patient Location:: Home Provider Location:: Home Information given by:: patient Interpreter Needed?: No Pre-visit prep was completed: no AWV questionnaire completed by patient prior to visit?: no Living arrangements:: (!) lives alone Patient's Overall Health Status Rating: very good Typical amount of pain: none Does pain affect daily life?: no Are you currently prescribed opioids?: no  Fall Screening Falls in the past year?: (Patient-Rptd) 0 Patient Fall Risk Level: Moderate fall risk  Advance Directives (For Healthcare) Does Patient Have a Medical Advance Directive?: No Would patient like information on creating a medical advance directive?: No - Patient declined        Objective:    Today's Vitals   02/25/24 1004  Weight: 225 lb (102.1 kg)  Height: 5' 11 (1.803 m)   Body mass index is 31.38 kg/m.  Current Medications (verified) Outpatient Encounter Medications as of 02/25/2024  Medication Sig   acetaminophen  (TYLENOL ) 500 MG tablet Take 500 mg by mouth in the morning and at bedtime.   alfuzosin  (UROXATRAL ) 10 MG 24 hr tablet TAKE 1 TABLET BY MOUTH DAILY WITH BREAKFAST   amitriptyline  (ELAVIL ) 25 MG tablet Take 1 tablet (25 mg total) by mouth at  bedtime.   Cyanocobalamin  (B-12 IJ) Inject as directed once a week.   diltiazem  (CARDIZEM  CD) 360 MG 24 hr capsule Take 1 capsule (360 mg total) by mouth daily.   dutasteride  (AVODART ) 0.5 MG capsule TAKE ONE CAPSULE BY MOUTH DAILY   ibuprofen (ADVIL) 400 MG tablet Take 400 mg by mouth as needed.   lidocaine  (XYLOCAINE ) 5 % ointment Apply 1 Application topically 4 (four) times daily as needed (soreness).   meloxicam  (MOBIC ) 15 MG tablet TAKE 1 TABLET BY MOUTH DAILY   mometasone (NASONEX) 50 MCG/ACT nasal spray Place 1 spray into the nose daily.   Multiple Vitamins-Minerals (MULTIVITAMIN WITH MINERALS) tablet Take 1 tablet by mouth daily.   mupirocin  ointment (BACTROBAN ) 2 % Apply 1 Application topically 2 (two) times daily.   SYRINGE-NEEDLE, DISP, 3 ML (LUER LOCK SAFETY SYRINGES) 21G X 1-1/2 3 ML MISC 1 application  by Does not apply route every 14 (fourteen) days.   tadalafil  (CIALIS ) 20 MG tablet Take 1 tablet (20 mg total) by mouth as needed for erectile dysfunction.   temazepam  (RESTORIL )  30 MG capsule Take 1 capsule (30 mg total) by mouth at bedtime as needed for sleep.   testosterone  cypionate (DEPOTESTOSTERONE CYPIONATE) 200 MG/ML injection INJECT 1 MILLILITER INTRAMUSCULARLY EVERY 14 DAYS   No facility-administered encounter medications on file as of 02/25/2024.   Hearing/Vision screen No results found. Immunizations and Health Maintenance Health Maintenance  Topic Date Due   Medicare Annual Wellness (AWV)  10/23/2023   Influenza Vaccine  11/08/2023   Lung Cancer Screening  11/22/2023   COVID-19 Vaccine (7 - 2025-26 season) 12/09/2023   Colonoscopy  10/13/2024   DTaP/Tdap/Td (2 - Td or Tdap) 01/02/2028   Pneumococcal Vaccine: 50+ Years  Completed   Hepatitis C Screening  Completed   Zoster Vaccines- Shingrix  Completed   Meningococcal B Vaccine  Aged Out        Assessment/Plan:  This is a routine wellness examination for Tayte.  Patient Care Team: Johnny Garnette LABOR, MD as  PCP - General (Family Medicine) Shlomo Wilbert SAUNDERS, MD as PCP - Sleep Medicine (Cardiology) Inocencio Soyla Lunger, MD as PCP - Electrophysiology (Cardiology)  I have personally reviewed and noted the following in the patient's chart:   Medical and social history Use of alcohol, tobacco or illicit drugs  Current medications and supplements including opioid prescriptions. Functional ability and status Nutritional status Physical activity Advanced directives List of other physicians Hospitalizations, surgeries, and ER visits in previous 12 months Vitals Screenings to include cognitive, depression, and falls Referrals and appointments  No orders of the defined types were placed in this encounter.  In addition, I have reviewed and discussed with patient certain preventive protocols, quality metrics, and best practice recommendations. A written personalized care plan for preventive services as well as general preventive health recommendations were provided to patient.   Tangelia Sanson L Elier Zellars, CMA   02/25/2024   No follow-ups on file.  After Visit Summary: (MyChart) Due to this being a telephonic visit, the after visit summary with patients personalized plan was offered to patient via MyChart   Nurse Notes: Patient is due for a flu vaccine and he stated he will get the Covid vaccine as well.  Patient will call the office to get scheduled for his CPE.  He had no other concerns to address today.

## 2024-04-03 ENCOUNTER — Encounter: Payer: Self-pay | Admitting: Family Medicine

## 2024-04-08 NOTE — Telephone Encounter (Signed)
 Tamiflu is only indicated for influenza A or B. It will not have any effect on any other viruses. We would need a positive flu test to prescribe this

## 2024-04-20 ENCOUNTER — Other Ambulatory Visit: Payer: Self-pay

## 2024-04-21 MED ORDER — DILTIAZEM HCL ER COATED BEADS 360 MG PO CP24: 360.0000 mg | ORAL_CAPSULE | Freq: Every day | ORAL | 0 refills | Status: AC

## 2024-05-02 ENCOUNTER — Encounter: Payer: Self-pay | Admitting: Family Medicine

## 2024-05-02 DIAGNOSIS — M19041 Primary osteoarthritis, right hand: Secondary | ICD-10-CM

## 2024-05-05 NOTE — Telephone Encounter (Signed)
 I did the referral

## 2024-05-05 NOTE — Telephone Encounter (Signed)
 I can refer him to Rheumatology if he agrees

## 2024-05-08 ENCOUNTER — Other Ambulatory Visit: Payer: Self-pay | Admitting: Family Medicine
